# Patient Record
Sex: Male | Born: 1945
Health system: Southern US, Community
[De-identification: ages and names within clinical notes are randomized; demographics above are authoritative.]

## PROBLEM LIST (undated history)

## (undated) DIAGNOSIS — Z950 Presence of cardiac pacemaker: Secondary | ICD-10-CM

## (undated) DIAGNOSIS — M1990 Unspecified osteoarthritis, unspecified site: Secondary | ICD-10-CM

## (undated) DIAGNOSIS — Z8669 Personal history of other diseases of the nervous system and sense organs: Secondary | ICD-10-CM

## (undated) DIAGNOSIS — T4145XA Adverse effect of unspecified anesthetic, initial encounter: Secondary | ICD-10-CM

## (undated) DIAGNOSIS — C801 Malignant (primary) neoplasm, unspecified: Secondary | ICD-10-CM

## (undated) DIAGNOSIS — T8859XA Other complications of anesthesia, initial encounter: Secondary | ICD-10-CM

## (undated) DIAGNOSIS — I459 Conduction disorder, unspecified: Secondary | ICD-10-CM

## (undated) DIAGNOSIS — G7000 Myasthenia gravis without (acute) exacerbation: Secondary | ICD-10-CM

## (undated) DIAGNOSIS — Z96619 Presence of unspecified artificial shoulder joint: Secondary | ICD-10-CM

## (undated) DIAGNOSIS — Z8546 Personal history of malignant neoplasm of prostate: Secondary | ICD-10-CM

## (undated) DIAGNOSIS — K219 Gastro-esophageal reflux disease without esophagitis: Secondary | ICD-10-CM

## (undated) DIAGNOSIS — I442 Atrioventricular block, complete: Secondary | ICD-10-CM

## (undated) HISTORY — DX: Presence of unspecified artificial shoulder joint: Z96.619

## (undated) HISTORY — PX: CATARACT EXTRACTION: SUR2

## (undated) HISTORY — DX: Personal history of other diseases of the nervous system and sense organs: Z86.69

## (undated) HISTORY — DX: Personal history of malignant neoplasm of prostate: Z85.46

## (undated) HISTORY — PX: TONSILLECTOMY: SUR1361

## (undated) HISTORY — DX: Atrioventricular block, complete: I44.2

## (undated) HISTORY — DX: Myasthenia gravis without (acute) exacerbation: G70.00

---

## 1989-12-02 DIAGNOSIS — C801 Malignant (primary) neoplasm, unspecified: Secondary | ICD-10-CM

## 1989-12-02 HISTORY — PX: PROSTATE SURGERY: SHX751

## 1989-12-02 HISTORY — DX: Malignant (primary) neoplasm, unspecified: C80.1

## 1991-12-03 HISTORY — PX: PENILE PROSTHESIS IMPLANT: SHX240

## 2003-12-03 HISTORY — PX: HERNIA REPAIR: SHX51

## 2014-03-31 DIAGNOSIS — M25569 Pain in unspecified knee: Secondary | ICD-10-CM | POA: Diagnosis not present

## 2014-03-31 DIAGNOSIS — M1710 Unilateral primary osteoarthritis, unspecified knee: Secondary | ICD-10-CM | POA: Diagnosis not present

## 2014-03-31 DIAGNOSIS — M25519 Pain in unspecified shoulder: Secondary | ICD-10-CM | POA: Diagnosis not present

## 2014-03-31 DIAGNOSIS — M19019 Primary osteoarthritis, unspecified shoulder: Secondary | ICD-10-CM | POA: Diagnosis not present

## 2014-04-13 DIAGNOSIS — N10 Acute tubulo-interstitial nephritis: Secondary | ICD-10-CM | POA: Diagnosis not present

## 2014-04-27 DIAGNOSIS — N10 Acute tubulo-interstitial nephritis: Secondary | ICD-10-CM | POA: Diagnosis not present

## 2014-04-28 DIAGNOSIS — M25819 Other specified joint disorders, unspecified shoulder: Secondary | ICD-10-CM | POA: Diagnosis not present

## 2014-04-28 DIAGNOSIS — M1710 Unilateral primary osteoarthritis, unspecified knee: Secondary | ICD-10-CM | POA: Diagnosis not present

## 2014-04-28 DIAGNOSIS — IMO0002 Reserved for concepts with insufficient information to code with codable children: Secondary | ICD-10-CM | POA: Diagnosis not present

## 2014-05-16 DIAGNOSIS — J111 Influenza due to unidentified influenza virus with other respiratory manifestations: Secondary | ICD-10-CM | POA: Diagnosis not present

## 2014-05-24 DIAGNOSIS — N318 Other neuromuscular dysfunction of bladder: Secondary | ICD-10-CM | POA: Diagnosis not present

## 2014-05-24 DIAGNOSIS — N390 Urinary tract infection, site not specified: Secondary | ICD-10-CM | POA: Diagnosis not present

## 2014-05-24 DIAGNOSIS — R5381 Other malaise: Secondary | ICD-10-CM | POA: Diagnosis not present

## 2014-05-24 DIAGNOSIS — R5383 Other fatigue: Secondary | ICD-10-CM | POA: Diagnosis not present

## 2014-05-24 DIAGNOSIS — C61 Malignant neoplasm of prostate: Secondary | ICD-10-CM | POA: Diagnosis not present

## 2014-05-25 DIAGNOSIS — S8010XA Contusion of unspecified lower leg, initial encounter: Secondary | ICD-10-CM | POA: Diagnosis not present

## 2014-07-26 DIAGNOSIS — N3000 Acute cystitis without hematuria: Secondary | ICD-10-CM | POA: Diagnosis not present

## 2014-08-11 DIAGNOSIS — M1710 Unilateral primary osteoarthritis, unspecified knee: Secondary | ICD-10-CM | POA: Diagnosis not present

## 2014-08-15 DIAGNOSIS — M25519 Pain in unspecified shoulder: Secondary | ICD-10-CM | POA: Diagnosis not present

## 2014-08-31 DIAGNOSIS — M19019 Primary osteoarthritis, unspecified shoulder: Secondary | ICD-10-CM | POA: Diagnosis not present

## 2014-09-06 DIAGNOSIS — Z23 Encounter for immunization: Secondary | ICD-10-CM | POA: Diagnosis not present

## 2014-09-07 DIAGNOSIS — E780 Pure hypercholesterolemia: Secondary | ICD-10-CM | POA: Diagnosis not present

## 2014-09-07 DIAGNOSIS — M19211 Secondary osteoarthritis, right shoulder: Secondary | ICD-10-CM | POA: Diagnosis not present

## 2014-09-07 DIAGNOSIS — M859 Disorder of bone density and structure, unspecified: Secondary | ICD-10-CM | POA: Diagnosis not present

## 2014-09-07 DIAGNOSIS — M898X9 Other specified disorders of bone, unspecified site: Secondary | ICD-10-CM | POA: Diagnosis not present

## 2014-09-07 DIAGNOSIS — Z13820 Encounter for screening for osteoporosis: Secondary | ICD-10-CM | POA: Diagnosis not present

## 2014-09-07 DIAGNOSIS — Z23 Encounter for immunization: Secondary | ICD-10-CM | POA: Diagnosis not present

## 2014-09-07 DIAGNOSIS — Z0000 Encounter for general adult medical examination without abnormal findings: Secondary | ICD-10-CM | POA: Diagnosis not present

## 2014-09-22 DIAGNOSIS — M25511 Pain in right shoulder: Secondary | ICD-10-CM | POA: Diagnosis not present

## 2014-09-30 ENCOUNTER — Encounter (HOSPITAL_COMMUNITY): Payer: Self-pay | Admitting: Pharmacy Technician

## 2014-10-04 ENCOUNTER — Encounter (HOSPITAL_COMMUNITY): Payer: Self-pay

## 2014-10-04 ENCOUNTER — Encounter (HOSPITAL_COMMUNITY)
Admission: RE | Admit: 2014-10-04 | Discharge: 2014-10-04 | Disposition: A | Discharge status: Home / Self Care | Payer: Medicare Other | Source: Ambulatory Visit | Attending: Orthopedic Surgery | Admitting: Orthopedic Surgery

## 2014-10-04 DIAGNOSIS — Z01812 Encounter for preprocedural laboratory examination: Secondary | ICD-10-CM | POA: Insufficient documentation

## 2014-10-04 HISTORY — DX: Adverse effect of unspecified anesthetic, initial encounter: T41.45XA

## 2014-10-04 HISTORY — DX: Malignant (primary) neoplasm, unspecified: C80.1

## 2014-10-04 HISTORY — DX: Other complications of anesthesia, initial encounter: T88.59XA

## 2014-10-04 HISTORY — DX: Gastro-esophageal reflux disease without esophagitis: K21.9

## 2014-10-04 HISTORY — DX: Unspecified osteoarthritis, unspecified site: M19.90

## 2014-10-04 LAB — BASIC METABOLIC PANEL
Anion gap: 12 (ref 5–15)
BUN: 18 mg/dL (ref 6–23)
CHLORIDE: 100 meq/L (ref 96–112)
CO2: 26 meq/L (ref 19–32)
CREATININE: 0.96 mg/dL (ref 0.50–1.35)
Calcium: 9.9 mg/dL (ref 8.4–10.5)
GFR calc Af Amer: >90 mL/min (ref >90)
GFR calc non Af Amer: 83 mL/min — ABNORMAL LOW (ref >90)
GLUCOSE: 92 mg/dL (ref 70–99)
POTASSIUM: 4.6 meq/L (ref 3.7–5.3)
Sodium: 138 mEq/L (ref 137–147)

## 2014-10-04 LAB — CBC
HEMATOCRIT: 43.6 % (ref 39.0–52.0)
HEMOGLOBIN: 15.3 g/dL (ref 13.0–17.0)
MCH: 31.9 pg (ref 26.0–34.0)
MCHC: 35.1 g/dL (ref 30.0–36.0)
MCV: 90.8 fL (ref 78.0–100.0)
Platelets: 177 10*3/uL (ref 150–400)
RBC: 4.8 MIL/uL (ref 4.22–5.81)
RDW: 12.5 % (ref 11.5–15.5)
WBC: 8.5 10*3/uL (ref 4.0–10.5)

## 2014-10-04 NOTE — H&P (Signed)
  Kyle Bowen is an 68 y.o. male.    Chief Complaint: right shoulder pain  HPI: Pt is a 68 y.o. male complaining of right shoulder pain for multiple years. Pain had continually increased since the beginning. X-rays in the clinic show end-stage arthritic changes of the right shoulder. Pt has tried various conservative treatments which have failed to alleviate their symptoms, including injections and therapy. Various options are discussed with the patient. Risks, benefits and expectations were discussed with the patient. Patient understand the risks, benefits and expectations and wishes to proceed with surgery.   PCP:  Cyndy Freeze, MD  D/C Plans:  Home with HHPT  PMH: No past medical history on file.  PSH: No past surgical history on file.  Social History:  has no tobacco, alcohol, and drug history on file.  Allergies:  No Known Allergies  Medications: No current facility-administered medications for this encounter.   Current Outpatient Prescriptions  Medication Sig Dispense Refill  . aspirin 81 MG tablet Take 81 mg by mouth daily.    . calcium carbonate (OS-CAL) 600 MG TABS tablet Take 600 mg by mouth 2 (two) times daily with a meal.    . meloxicam (MOBIC) 15 MG tablet Take 15 mg by mouth daily.    . Multiple Vitamins-Minerals (MENS MULTI VITAMIN & MINERAL) TABS Take 1 tablet by mouth daily.    . Omega-3 Fatty Acids (FISH OIL) 300 MG CAPS Take 1 capsule by mouth daily.    . simvastatin (ZOCOR) 40 MG tablet Take 40 mg by mouth daily.      No results found for this or any previous visit (from the past 48 hour(s)). No results found.  ROS: Pain with rom of the right upper extremity  Physical Exam:  Alert and oriented 68 y.o. male in no acute distress Cranial nerves 2-12 intact Cervical spine: full rom with no tenderness, nv intact distally Chest: active breath sounds bilaterally, no wheeze rhonchi or rales Heart: regular rate and rhythm, no murmur Abd: non tender non  distended with active bowel sounds Hip is stable with rom  Right shoulder with mild to moderate crepitus with rom nv intact distally Strength of ER and IR 4.5/5  No rashes or edema  Assessment/Plan Assessment: right shoulder end stage osteoarthritis  Plan: Patient will undergo a right total shoulder arthroplasty by Dr. Veverly Fells at [facility]. Risks benefits and expectations were discussed with the patient. Patient understand risks, benefits and expectations and wishes to proceed.

## 2014-10-04 NOTE — Pre-Procedure Instructions (Addendum)
Kyle Bowen  10/04/2014   Your procedure is scheduled on:  Friday, November 13.             Report to [facility] Admitting at 11:40 AM.  Call this number if you have problems the morning of surgery: [phone]   Remember:   Do not eat food or drink liquids after midnight Thursday, November 10.   Take these medicines the morning of surgery with A SIP OF WATER: None.               Stop taking Aspirin, Meloxicam, Vitamins and Fish Oil on Friday, November 6.   Do not wear jewelry, make-up or nail polish.  Do not wear lotions, powders, or perfumes.    Men may shave face and neck.  Do not bring valuables to the hospital.               [facility] is not responsible for any belongings or valuables.               Contacts, dentures or bridgework may not be worn into surgery.  Leave suitcase in the car. After surgery it may be brought to your room.  For patients admitted to the hospital, discharge time is determined by your treatment team.               Patients discharged the day of surgery will not be allowed to drive home.  Name and phone number of your driver: -   Special Instructions: - Special Instructions: [place] - Preparing for Surgery  Before surgery, you can play an important role.  Because skin is not sterile, your skin needs to be as free of germs as possible.  You can reduce the number of germs on you skin by washing with CHG (chlorahexidine gluconate) soap before surgery.  CHG is an antiseptic cleaner which kills germs and bonds with the skin to continue killing germs even after washing.  Please DO NOT use if you have an allergy to CHG or antibacterial soaps.  If your skin becomes reddened/irritated stop using the CHG and inform your nurse when you arrive at Short Stay.  Do not shave (including legs and underarms) for at least 48 hours prior to the first CHG shower.  You may shave your face.  Please follow these instructions carefully:   1.  Shower  with CHG Soap the night before surgery and the morning of Surgery.  2.  If you choose to wash your hair, wash your hair first as usual with your normal shampoo.  3.  After you shampoo, rinse your hair and body thoroughly to remove the Shampoo.  4.  Use CHG as you would any other liquid soap.  You can apply chg directly  to the skin and wash gently with scrungie or a clean washcloth.  5.  Apply the CHG Soap to your body ONLY FROM THE NECK DOWN.  Do not use on open wounds or open sores.  Avoid contact with your eyes ears, mouth and genitals (private parts).  Wash genitals (private parts)       with your normal soap.  6.  Wash thoroughly, paying special attention to the area where your surgery will be performed.  7.  Thoroughly rinse your body with warm water from the neck down.  8.  DO NOT shower/wash with your normal soap after using and rinsing off the CHG Soap.  9.  Pat yourself dry with a clean towel.  10.  Wear clean pajamas.            11.  Place clean sheets on your bed the night of your first shower and do not sleep with pets.  Day of Surgery  Do not apply any lotions/deodorants the morning of surgery.  Please wear clean clothes to the hospital/surgery center.   Please read over the following fact sheets that you were given: Pain Booklet, Coughing and Deep Breathing and Surgical Site Infection Prevention

## 2014-10-13 MED ORDER — CEFAZOLIN SODIUM-DEXTROSE 2-3 GM-% IV SOLR
2.0000 g | INTRAVENOUS | Status: AC
  Administered 2014-10-14: 2 g via INTRAVENOUS
  Filled 2014-10-13: qty 50

## 2014-10-14 ENCOUNTER — Encounter (HOSPITAL_COMMUNITY)
Admission: RE | Disposition: A | Discharge status: Home / Self Care | Payer: Self-pay | Source: Ambulatory Visit | Attending: Orthopedic Surgery

## 2014-10-14 ENCOUNTER — Inpatient Hospital Stay (HOSPITAL_COMMUNITY): Payer: Medicare Other

## 2014-10-14 ENCOUNTER — Inpatient Hospital Stay (HOSPITAL_COMMUNITY): Payer: Medicare Other | Admitting: Anesthesiology

## 2014-10-14 ENCOUNTER — Encounter (HOSPITAL_COMMUNITY): Payer: Self-pay | Admitting: *Deleted

## 2014-10-14 ENCOUNTER — Inpatient Hospital Stay (HOSPITAL_COMMUNITY)
Admission: RE | Admit: 2014-10-14 | Discharge: 2014-10-15 | DRG: 483 | Disposition: A | Discharge status: Home / Self Care | Payer: Medicare Other | Source: Ambulatory Visit | Attending: Orthopedic Surgery | Admitting: Orthopedic Surgery

## 2014-10-14 DIAGNOSIS — Z79899 Other long term (current) drug therapy: Secondary | ICD-10-CM

## 2014-10-14 DIAGNOSIS — M1990 Unspecified osteoarthritis, unspecified site: Secondary | ICD-10-CM | POA: Diagnosis not present

## 2014-10-14 DIAGNOSIS — Z96619 Presence of unspecified artificial shoulder joint: Secondary | ICD-10-CM

## 2014-10-14 DIAGNOSIS — Z96611 Presence of right artificial shoulder joint: Secondary | ICD-10-CM | POA: Diagnosis not present

## 2014-10-14 DIAGNOSIS — Z7982 Long term (current) use of aspirin: Secondary | ICD-10-CM | POA: Diagnosis not present

## 2014-10-14 DIAGNOSIS — M19011 Primary osteoarthritis, right shoulder: Secondary | ICD-10-CM | POA: Diagnosis present

## 2014-10-14 DIAGNOSIS — G8918 Other acute postprocedural pain: Secondary | ICD-10-CM | POA: Diagnosis not present

## 2014-10-14 DIAGNOSIS — M25511 Pain in right shoulder: Secondary | ICD-10-CM | POA: Diagnosis not present

## 2014-10-14 DIAGNOSIS — Z471 Aftercare following joint replacement surgery: Secondary | ICD-10-CM | POA: Diagnosis not present

## 2014-10-14 HISTORY — DX: Presence of unspecified artificial shoulder joint: Z96.619

## 2014-10-14 HISTORY — PX: TOTAL SHOULDER ARTHROPLASTY: SHX126

## 2014-10-14 SURGERY — ARTHROPLASTY, SHOULDER, TOTAL
Anesthesia: Regional | Site: Shoulder | Laterality: Right

## 2014-10-14 MED ORDER — ASPIRIN 81 MG PO TABS: 81.0000 mg | ORAL_TABLET | Freq: Every day | ORAL | Status: DC

## 2014-10-14 MED ORDER — THROMBIN 5000 UNITS EX SOLR
OROMUCOSAL | Status: DC | PRN
  Administered 2014-10-14: 5 mL via TOPICAL

## 2014-10-14 MED ORDER — THROMBIN 5000 UNITS EX SOLR: CUTANEOUS | Status: DC | PRN

## 2014-10-14 MED ORDER — NEOSTIGMINE METHYLSULFATE 10 MG/10ML IV SOLN
INTRAVENOUS | Status: DC | PRN
  Administered 2014-10-14: 3 mg via INTRAVENOUS

## 2014-10-14 MED ORDER — METOCLOPRAMIDE HCL 5 MG/ML IJ SOLN: 5.0000 mg | Freq: Three times a day (TID) | INTRAMUSCULAR | Status: DC | PRN

## 2014-10-14 MED ORDER — CEFAZOLIN SODIUM-DEXTROSE 2-3 GM-% IV SOLR
2.0000 g | Freq: Four times a day (QID) | INTRAVENOUS | Status: AC
  Administered 2014-10-14 – 2014-10-15 (×3): 2 g via INTRAVENOUS
  Filled 2014-10-14 (×3): qty 50

## 2014-10-14 MED ORDER — ONDANSETRON HCL 4 MG/2ML IJ SOLN: 4.0000 mg | Freq: Four times a day (QID) | INTRAMUSCULAR | Status: DC | PRN

## 2014-10-14 MED ORDER — PROPOFOL 10 MG/ML IV BOLUS
INTRAVENOUS | Status: AC
  Filled 2014-10-14: qty 20

## 2014-10-14 MED ORDER — FENTANYL CITRATE 0.05 MG/ML IJ SOLN
INTRAMUSCULAR | Status: AC
  Administered 2014-10-14: 100 ug
  Filled 2014-10-14: qty 2

## 2014-10-14 MED ORDER — BUPIVACAINE-EPINEPHRINE (PF) 0.25% -1:200000 IJ SOLN
INTRAMUSCULAR | Status: AC
  Filled 2014-10-14: qty 30

## 2014-10-14 MED ORDER — BUPIVACAINE-EPINEPHRINE (PF) 0.5% -1:200000 IJ SOLN
INTRAMUSCULAR | Status: DC | PRN
  Administered 2014-10-14: 30 mL via PERINEURAL

## 2014-10-14 MED ORDER — BISACODYL 10 MG RE SUPP: 10.0000 mg | Freq: Every day | RECTAL | Status: DC | PRN

## 2014-10-14 MED ORDER — OXYCODONE-ACETAMINOPHEN 5-325 MG PO TABS
1.0000 | ORAL_TABLET | ORAL | Status: DC | PRN
  Administered 2014-10-15 (×4): 1 via ORAL
  Filled 2014-10-14 (×4): qty 1

## 2014-10-14 MED ORDER — MENS MULTI VITAMIN & MINERAL PO TABS: 1.0000 | ORAL_TABLET | Freq: Every day | ORAL | Status: DC

## 2014-10-14 MED ORDER — PROPOFOL 10 MG/ML IV BOLUS
INTRAVENOUS | Status: DC | PRN
  Administered 2014-10-14: 180 mg via INTRAVENOUS

## 2014-10-14 MED ORDER — ONDANSETRON HCL 4 MG/2ML IJ SOLN
INTRAMUSCULAR | Status: DC | PRN
Start: 2014-10-14 — End: 2014-10-14
  Administered 2014-10-14: 4 mg via INTRAVENOUS

## 2014-10-14 MED ORDER — OMEGA-3-ACID ETHYL ESTERS 1 G PO CAPS
1.0000 g | ORAL_CAPSULE | Freq: Every day | ORAL | Status: DC
  Administered 2014-10-14 – 2014-10-15 (×2): 1 g via ORAL
  Filled 2014-10-14 (×2): qty 1

## 2014-10-14 MED ORDER — LACTATED RINGERS IV SOLN
INTRAVENOUS | Status: DC
  Administered 2014-10-14 (×2): via INTRAVENOUS

## 2014-10-14 MED ORDER — THROMBIN 5000 UNITS EX SOLR
CUTANEOUS | Status: AC
  Filled 2014-10-14: qty 5000

## 2014-10-14 MED ORDER — FENTANYL CITRATE 0.05 MG/ML IJ SOLN
INTRAMUSCULAR | Status: DC | PRN
  Administered 2014-10-14: 100 ug via INTRAVENOUS

## 2014-10-14 MED ORDER — METHOCARBAMOL 500 MG PO TABS
500.0000 mg | ORAL_TABLET | Freq: Four times a day (QID) | ORAL | Status: DC | PRN
  Administered 2014-10-15 (×2): 500 mg via ORAL
  Filled 2014-10-14 (×2): qty 1

## 2014-10-14 MED ORDER — OXYCODONE-ACETAMINOPHEN 5-325 MG PO TABS: 1.0000 | ORAL_TABLET | ORAL | Status: DC | PRN

## 2014-10-14 MED ORDER — CALCIUM CARBONATE 600 MG PO TABS: 600.0000 mg | ORAL_TABLET | Freq: Two times a day (BID) | ORAL | Status: DC

## 2014-10-14 MED ORDER — SODIUM CHLORIDE 0.9 % IV SOLN
10.0000 mg | INTRAVENOUS | Status: DC | PRN
  Administered 2014-10-14: 10 ug/min via INTRAVENOUS

## 2014-10-14 MED ORDER — BUPIVACAINE-EPINEPHRINE 0.25% -1:200000 IJ SOLN
INTRAMUSCULAR | Status: DC | PRN
  Administered 2014-10-14: 9 mL

## 2014-10-14 MED ORDER — ASPIRIN EC 81 MG PO TBEC
81.0000 mg | DELAYED_RELEASE_TABLET | Freq: Every day | ORAL | Status: DC
  Administered 2014-10-14 – 2014-10-15 (×2): 81 mg via ORAL
  Filled 2014-10-14 (×2): qty 1

## 2014-10-14 MED ORDER — METHOCARBAMOL 500 MG PO TABS: 500.0000 mg | ORAL_TABLET | Freq: Three times a day (TID) | ORAL | Status: DC | PRN

## 2014-10-14 MED ORDER — HYDROMORPHONE HCL 1 MG/ML IJ SOLN: 0.2500 mg | INTRAMUSCULAR | Status: DC | PRN

## 2014-10-14 MED ORDER — ALBUMIN HUMAN 5 % IV SOLN
INTRAVENOUS | Status: AC
  Filled 2014-10-14: qty 250

## 2014-10-14 MED ORDER — LIDOCAINE HCL (CARDIAC) 20 MG/ML IV SOLN
INTRAVENOUS | Status: DC | PRN
  Administered 2014-10-14: 60 mg via INTRAVENOUS

## 2014-10-14 MED ORDER — OXYCODONE HCL 5 MG/5ML PO SOLN: 5.0000 mg | Freq: Once | ORAL | Status: DC | PRN

## 2014-10-14 MED ORDER — SODIUM CHLORIDE 0.9 % IR SOLN
Status: DC | PRN
  Administered 2014-10-14: 1000 mL

## 2014-10-14 MED ORDER — PHENOL 1.4 % MT LIQD: 1.0000 | OROMUCOSAL | Status: DC | PRN

## 2014-10-14 MED ORDER — ONDANSETRON HCL 4 MG/2ML IJ SOLN
INTRAMUSCULAR | Status: AC
  Filled 2014-10-14: qty 2

## 2014-10-14 MED ORDER — CHLORHEXIDINE GLUCONATE 4 % EX LIQD
60.0000 mL | Freq: Once | CUTANEOUS | Status: DC
  Filled 2014-10-14: qty 60

## 2014-10-14 MED ORDER — METHOCARBAMOL 1000 MG/10ML IJ SOLN
500.0000 mg | Freq: Four times a day (QID) | INTRAVENOUS | Status: DC | PRN
  Filled 2014-10-14: qty 5

## 2014-10-14 MED ORDER — GLYCOPYRROLATE 0.2 MG/ML IJ SOLN
INTRAMUSCULAR | Status: AC
  Filled 2014-10-14: qty 2

## 2014-10-14 MED ORDER — POLYETHYLENE GLYCOL 3350 17 G PO PACK
17.0000 g | PACK | Freq: Every day | ORAL | Status: DC | PRN
Start: 2014-10-14 — End: 2014-10-15

## 2014-10-14 MED ORDER — LIDOCAINE HCL (CARDIAC) 20 MG/ML IV SOLN
INTRAVENOUS | Status: AC
  Filled 2014-10-14: qty 5

## 2014-10-14 MED ORDER — SODIUM CHLORIDE 0.9 % IV SOLN: INTRAVENOUS | Status: DC

## 2014-10-14 MED ORDER — ACETAMINOPHEN 325 MG PO TABS: 650.0000 mg | ORAL_TABLET | Freq: Four times a day (QID) | ORAL | Status: DC | PRN

## 2014-10-14 MED ORDER — ONDANSETRON HCL 4 MG PO TABS: 4.0000 mg | ORAL_TABLET | Freq: Four times a day (QID) | ORAL | Status: DC | PRN

## 2014-10-14 MED ORDER — SIMVASTATIN 40 MG PO TABS
40.0000 mg | ORAL_TABLET | Freq: Every day | ORAL | Status: DC
  Administered 2014-10-14 – 2014-10-15 (×2): 40 mg via ORAL
  Filled 2014-10-14 (×2): qty 1

## 2014-10-14 MED ORDER — HYDROMORPHONE HCL 1 MG/ML IJ SOLN: 1.0000 mg | INTRAMUSCULAR | Status: DC | PRN

## 2014-10-14 MED ORDER — ALBUMIN HUMAN 5 % IV SOLN
12.5000 g | Freq: Once | INTRAVENOUS | Status: AC
  Administered 2014-10-14: 12.5 g via INTRAVENOUS

## 2014-10-14 MED ORDER — PHENYLEPHRINE HCL 10 MG/ML IJ SOLN
INTRAMUSCULAR | Status: AC
  Filled 2014-10-14: qty 2

## 2014-10-14 MED ORDER — ROCURONIUM BROMIDE 50 MG/5ML IV SOLN
INTRAVENOUS | Status: AC
  Filled 2014-10-14: qty 1

## 2014-10-14 MED ORDER — ACETAMINOPHEN 650 MG RE SUPP: 650.0000 mg | Freq: Four times a day (QID) | RECTAL | Status: DC | PRN

## 2014-10-14 MED ORDER — MELOXICAM 15 MG PO TABS
15.0000 mg | ORAL_TABLET | Freq: Every day | ORAL | Status: DC
  Administered 2014-10-14 – 2014-10-15 (×2): 15 mg via ORAL
  Filled 2014-10-14 (×2): qty 1

## 2014-10-14 MED ORDER — CEFAZOLIN SODIUM-DEXTROSE 2-3 GM-% IV SOLR: INTRAVENOUS | Status: DC | PRN

## 2014-10-14 MED ORDER — MIDAZOLAM HCL 2 MG/2ML IJ SOLN
INTRAMUSCULAR | Status: AC
  Administered 2014-10-14: 2 mg
  Filled 2014-10-14: qty 2

## 2014-10-14 MED ORDER — FENTANYL CITRATE 0.05 MG/ML IJ SOLN
INTRAMUSCULAR | Status: AC
  Filled 2014-10-14: qty 5

## 2014-10-14 MED ORDER — ADULT MULTIVITAMIN W/MINERALS CH
1.0000 | ORAL_TABLET | Freq: Every day | ORAL | Status: DC
  Administered 2014-10-15: 1 via ORAL
  Filled 2014-10-14: qty 1

## 2014-10-14 MED ORDER — GLYCOPYRROLATE 0.2 MG/ML IJ SOLN
INTRAMUSCULAR | Status: DC | PRN
  Administered 2014-10-14: .4 mg via INTRAVENOUS

## 2014-10-14 MED ORDER — MENTHOL 3 MG MT LOZG: 1.0000 | LOZENGE | OROMUCOSAL | Status: DC | PRN

## 2014-10-14 MED ORDER — OXYCODONE HCL 5 MG PO TABS: 5.0000 mg | ORAL_TABLET | Freq: Once | ORAL | Status: DC | PRN

## 2014-10-14 MED ORDER — CALCIUM CARBONATE 1250 (500 CA) MG PO TABS
1.0000 | ORAL_TABLET | Freq: Two times a day (BID) | ORAL | Status: DC
  Administered 2014-10-15: 500 mg via ORAL
  Filled 2014-10-14 (×3): qty 1

## 2014-10-14 MED ORDER — ROCURONIUM BROMIDE 100 MG/10ML IV SOLN
INTRAVENOUS | Status: DC | PRN
  Administered 2014-10-14: 30 mg via INTRAVENOUS

## 2014-10-14 MED ORDER — METOCLOPRAMIDE HCL 10 MG PO TABS: 5.0000 mg | ORAL_TABLET | Freq: Three times a day (TID) | ORAL | Status: DC | PRN

## 2014-10-14 SURGICAL SUPPLY — 77 items
BLADE SAW SAG 73X25 THK (BLADE) ×2
BLADE SAW SGTL 73X25 THK (BLADE) ×1 IMPLANT
BODY PROX SHOULDER SZ 14-135 (Shoulder) ×3 IMPLANT
BUR SURG 4X8 MED (BURR) IMPLANT
BURR SURG 4MMX8MM MEDIUM (BURR)
BURR SURG 4X8 MED (BURR)
CEMENT BONE DEPUY (Cement) ×3 IMPLANT
CLOSURE WOUND 1/2 X4 (GAUZE/BANDAGES/DRESSINGS) ×1
COVER SURGICAL LIGHT HANDLE (MISCELLANEOUS) ×3 IMPLANT
DRAPE IMP U-DRAPE 54X76 (DRAPES) ×3 IMPLANT
DRAPE INCISE IOBAN 66X45 STRL (DRAPES) ×9 IMPLANT
DRAPE U-SHAPE 47X51 STRL (DRAPES) ×3 IMPLANT
DRAPE X-RAY CASS 24X20 (DRAPES) IMPLANT
DRILL BIT 5/64 (BIT) ×3 IMPLANT
DRSG ADAPTIC 3X8 NADH LF (GAUZE/BANDAGES/DRESSINGS) ×3 IMPLANT
DRSG PAD ABDOMINAL 8X10 ST (GAUZE/BANDAGES/DRESSINGS) ×6 IMPLANT
DURAPREP 26ML APPLICATOR (WOUND CARE) ×3 IMPLANT
ELECT BLADE 4.0 EZ CLEAN MEGAD (MISCELLANEOUS) ×3
ELECT NEEDLE TIP 2.8 STRL (NEEDLE) ×3 IMPLANT
ELECT REM PT RETURN 9FT ADLT (ELECTROSURGICAL) ×3
ELECTRODE BLDE 4.0 EZ CLN MEGD (MISCELLANEOUS) ×1 IMPLANT
ELECTRODE REM PT RTRN 9FT ADLT (ELECTROSURGICAL) ×1 IMPLANT
GAUZE SPONGE 4X4 12PLY STRL (GAUZE/BANDAGES/DRESSINGS) ×3 IMPLANT
GLENOID ANCHOR PEG CROSSLK 48 (Orthopedic Implant) ×3 IMPLANT
GLOVE BIOGEL PI ORTHO PRO 7.5 (GLOVE) ×2
GLOVE BIOGEL PI ORTHO PRO SZ8 (GLOVE) ×2
GLOVE ORTHO TXT STRL SZ7.5 (GLOVE) ×3 IMPLANT
GLOVE PI ORTHO PRO STRL 7.5 (GLOVE) ×1 IMPLANT
GLOVE PI ORTHO PRO STRL SZ8 (GLOVE) ×1 IMPLANT
GLOVE SURG ORTHO 8.5 STRL (GLOVE) ×6 IMPLANT
GOWN STRL REUS W/ TWL XL LVL3 (GOWN DISPOSABLE) ×3 IMPLANT
GOWN STRL REUS W/TWL XL LVL3 (GOWN DISPOSABLE) ×6
HANDPIECE INTERPULSE COAX TIP (DISPOSABLE)
HEAD ECC HUMERAL SZ 48MMX18MM (Head) ×3 IMPLANT
KIT BASIN OR (CUSTOM PROCEDURE TRAY) ×3 IMPLANT
KIT ROOM TURNOVER OR (KITS) ×3 IMPLANT
MANIFOLD NEPTUNE II (INSTRUMENTS) ×3 IMPLANT
NDL SUT 2 .5 CRC MAYO 1.732X (NEEDLE) ×1 IMPLANT
NDL SUT 6 .5 CRC .975X.05 MAYO (NEEDLE) ×1 IMPLANT
NEEDLE 1/2 CIR MAYO (NEEDLE) ×3 IMPLANT
NEEDLE HYPO 25GX1X1/2 BEV (NEEDLE) ×3 IMPLANT
NEEDLE MAYO TAPER (NEEDLE) ×4
NS IRRIG 1000ML POUR BTL (IV SOLUTION) ×3 IMPLANT
PACK SHOULDER (CUSTOM PROCEDURE TRAY) ×3 IMPLANT
PACK UNIVERSAL I (CUSTOM PROCEDURE TRAY) ×3 IMPLANT
PAD ARMBOARD 7.5X6 YLW CONV (MISCELLANEOUS) ×3 IMPLANT
PIN METAGLENE 2.5 (PIN) ×3 IMPLANT
SET HNDPC FAN SPRY TIP SCT (DISPOSABLE) IMPLANT
SLING ARM IMMOBILIZER LRG (SOFTGOODS) ×3 IMPLANT
SLING ARM IMMOBILIZER MED (SOFTGOODS) IMPLANT
SMARTMIX MINI TOWER (MISCELLANEOUS) ×3
SPONGE LAP 18X18 X RAY DECT (DISPOSABLE) ×3 IMPLANT
SPONGE LAP 4X18 X RAY DECT (DISPOSABLE) ×3 IMPLANT
SPONGE SURGIFOAM ABS GEL SZ50 (HEMOSTASIS) IMPLANT
STEM GLOBAL UNITE 14MM PORO 11 (Joint) ×3 IMPLANT
STRIP CLOSURE SKIN 1/2X4 (GAUZE/BANDAGES/DRESSINGS) ×2 IMPLANT
SUCTION FRAZIER TIP 10 FR DISP (SUCTIONS) ×3 IMPLANT
SUT BONE WAX W31G (SUTURE) ×3 IMPLANT
SUT FIBERWIRE #2 38 REV NDL BL (SUTURE) ×6
SUT FIBERWIRE #2 38 T-5 BLUE (SUTURE) ×15
SUT MNCRL AB 3-0 PS2 18 (SUTURE) ×3 IMPLANT
SUT MNCRL AB 4-0 PS2 18 (SUTURE) ×3 IMPLANT
SUT VIC AB 0 CT1 27 (SUTURE) ×2
SUT VIC AB 0 CT1 27XBRD ANBCTR (SUTURE) ×1 IMPLANT
SUT VIC AB 2-0 CT1 27 (SUTURE) ×2
SUT VIC AB 2-0 CT1 TAPERPNT 27 (SUTURE) ×1 IMPLANT
SUT VICRYL AB 2 0 TIES (SUTURE) ×3 IMPLANT
SUTURE FIBERWR #2 38 T-5 BLUE (SUTURE) ×5 IMPLANT
SUTURE FIBERWR#2 38 REV NDL BL (SUTURE) ×2 IMPLANT
SYR CONTROL 10ML LL (SYRINGE) ×3 IMPLANT
TAPE CLOTH SURG 6X10 WHT LF (GAUZE/BANDAGES/DRESSINGS) ×3 IMPLANT
TOWEL OR 17X24 6PK STRL BLUE (TOWEL DISPOSABLE) ×3 IMPLANT
TOWEL OR 17X26 10 PK STRL BLUE (TOWEL DISPOSABLE) ×3 IMPLANT
TOWER SMARTMIX MINI (MISCELLANEOUS) ×1 IMPLANT
TRAY FOLEY CATH 16FRSI W/METER (SET/KITS/TRAYS/PACK) IMPLANT
WATER STERILE IRR 1000ML POUR (IV SOLUTION) ×3 IMPLANT
YANKAUER SUCT BULB TIP NO VENT (SUCTIONS) IMPLANT

## 2014-10-14 NOTE — Brief Op Note (Signed)
10/14/2014  4:24 PM  PATIENT:  Kyle Bowen  68 y.o. male  PRE-OPERATIVE DIAGNOSIS:  [place] OA, END STAGE  POST-OPERATIVE DIAGNOSIS:  RIGH SHOULDER OA, END STAGE  PROCEDURE:  Procedure(s): RIGHT TOTAL SHOULDER ARTHROPLASTY (Right) DePuy Global AP Stem with APG glenoid  SURGEON:  Surgeon(s) and Role:    * Augustin Schooling, MD - Primary  PHYSICIAN ASSISTANT:   ASSISTANTS: Ventura Bruns, PA-C   ANESTHESIA:   regional and general  EBL:  Total I/O In: 1000 [I.V.:1000] Out: -   BLOOD ADMINISTERED:none  DRAINS: none   LOCAL MEDICATIONS USED:  MARCAINE     SPECIMEN:  No Specimen  DISPOSITION OF SPECIMEN:  N/A  COUNTS:  YES  TOURNIQUET:  * No tourniquets in log *  DICTATION: .Other Dictation: Dictation Number [id number]  PLAN OF CARE: Admit to inpatient   PATIENT DISPOSITION:  PACU - hemodynamically stable.   Delay start of Pharmacological VTE agent (>24hrs) due to surgical blood loss or risk of bleeding: no

## 2014-10-14 NOTE — Interval H&P Note (Signed)
History and Physical Interval Note:  10/14/2014 1:00 PM  Kyle Bowen  has presented today for surgery, with the diagnosis of RIGH SHOULDER OA   The various methods of treatment have been discussed with the patient and family. After consideration of risks, benefits and other options for treatment, the patient has consented to  Procedure(s): RIGHT TOTAL SHOULDER ARTHROPLASTY (Right) as a surgical intervention .  The patient's history has been reviewed, patient examined, no change in status, stable for surgery.  I have reviewed the patient's chart and labs.  Questions were answered to the patient's satisfaction.     Oliviah Agostini,STEVEN R

## 2014-10-14 NOTE — Discharge Instructions (Signed)
Ice to the shoulder as much as possible.  Keep the incision clean and dry and intact, ok to shower and get the wound wet in one week.  Follow up with Dr Veverly Fells in 2 weeks  [phone]  Do the following exercises every hour,  Lap slides,  Door Hinge (rotation) exercise with the elbow near the side, assisted lifts.  No weight bearing with the right arm.  Use the sling out of the house, but ok to remove in the home, just hug a pillow

## 2014-10-14 NOTE — Transfer of Care (Signed)
Immediate Anesthesia Transfer of Care Note  Patient: Kyle Bowen  Procedure(s) Performed: Procedure(s): RIGHT TOTAL SHOULDER ARTHROPLASTY (Right)  Patient Location: PACU  Anesthesia Type:General  Level of Consciousness: awake, alert  and oriented  Airway & Oxygen Therapy: Patient Spontanous Breathing and Patient connected to nasal cannula oxygen  Post-op Assessment: Report given to PACU RN and Post -op Vital signs reviewed and stable  Post vital signs: Reviewed and stable  Complications: No apparent anesthesia complications

## 2014-10-14 NOTE — Anesthesia Procedure Notes (Addendum)
Anesthesia Regional Block:  Interscalene brachial plexus block  Pre-Anesthetic Checklist: ,, timeout performed, Correct Patient, Correct Site, Correct Laterality, Correct Procedure, Correct Position, site marked, Risks and benefits discussed,  Surgical consent,  Pre-op evaluation,  At surgeon's request and post-op pain management  Laterality: Right  Prep: chloraprep       Needles:  Injection technique: Single-shot  Needle Type: Echogenic Stimulator Needle     Needle Length: 5cm 5 cm Needle Gauge: 22 and 22 G    Additional Needles:  Procedures: ultrasound guided (picture in chart) and nerve stimulator Interscalene brachial plexus block  Nerve Stimulator or Paresthesia:  Response: biceps flexion, 0.45 mA,   Additional Responses:   Narrative:  Start time: 10/14/2014 12:25 PM End time: 10/14/2014 12:36 PM Injection made incrementally with aspirations every 5 mL.  Performed by: Personally   Additional Notes: Functioning IV was confirmed and monitors were applied.  A 58mm 22ga Arrow echogenic stimulator needle was used. Sterile prep and drape,hand hygiene and sterile gloves were used.  Negative aspiration and negative test dose prior to incremental administration of local anesthetic. The patient tolerated the procedure well.  Ultrasound guidance: relevent anatomy identified, needle position confirmed, local anesthetic spread visualized around nerve(s), vascular puncture avoided.  Image printed for medical record.    Procedure Name: Intubation Date/Time: 10/14/2014 1:25 PM Performed by: Rush Farmer E Pre-anesthesia Checklist: Patient identified, Emergency Drugs available, Suction available, Patient being monitored and Timeout performed Patient Re-evaluated:Patient Re-evaluated prior to inductionOxygen Delivery Method: Circle system utilized Preoxygenation: Pre-oxygenation with 100% oxygen Intubation Type: IV induction Ventilation: Mask ventilation without  difficulty Laryngoscope Size: Mac and 3 Grade View: Grade III Tube type: Oral Tube size: 7.5 mm Number of attempts: 1 Airway Equipment and Method: Stylet Placement Confirmation: positive ETCO2 and breath sounds checked- equal and bilateral Secured at: 22 cm Tube secured with: Tape Dental Injury: Teeth and Oropharynx as per pre-operative assessment

## 2014-10-14 NOTE — Anesthesia Preprocedure Evaluation (Addendum)
Anesthesia Evaluation  Patient identified by MRN, date of birth, ID band Patient awake    Reviewed: Allergy & Precautions, H&P , NPO status , Patient's Chart, lab work & pertinent test results  Airway Mallampati: II   Neck ROM: full    Dental  (+) Teeth Intact   Pulmonary neg pulmonary ROS,          Cardiovascular negative cardio ROS      Neuro/Psych    GI/Hepatic GERD-  ,  Endo/Other    Renal/GU      Musculoskeletal  (+) Arthritis -,   Abdominal   Peds  Hematology   Anesthesia Other Findings   Reproductive/Obstetrics                            Anesthesia Physical Anesthesia Plan  ASA: II  Anesthesia Plan: General and Regional   Post-op Pain Management: MAC Combined w/ Regional for Post-op pain   Induction: Intravenous  Airway Management Planned: Oral ETT  Additional Equipment:   Intra-op Plan:   Post-operative Plan: Extubation in OR  Informed Consent: I have reviewed the patients History and Physical, chart, labs and discussed the procedure including the risks, benefits and alternatives for the proposed anesthesia with the patient or authorized representative who has indicated his/her understanding and acceptance.     Plan Discussed with: CRNA, Anesthesiologist and Surgeon  Anesthesia Plan Comments:         Anesthesia Quick Evaluation

## 2014-10-15 LAB — BASIC METABOLIC PANEL
Anion gap: 10 (ref 5–15)
BUN: 11 mg/dL (ref 6–23)
CHLORIDE: 101 meq/L (ref 96–112)
CO2: 24 mEq/L (ref 19–32)
Calcium: 8.3 mg/dL — ABNORMAL LOW (ref 8.4–10.5)
Creatinine, Ser: 0.84 mg/dL (ref 0.50–1.35)
GFR, EST NON AFRICAN AMERICAN: 87 mL/min — AB (ref >90)
Glucose, Bld: 127 mg/dL — ABNORMAL HIGH (ref 70–99)
POTASSIUM: 4.1 meq/L (ref 3.7–5.3)
SODIUM: 135 meq/L — AB (ref 137–147)

## 2014-10-15 LAB — HEMOGLOBIN AND HEMATOCRIT, BLOOD
HCT: 33.9 % — ABNORMAL LOW (ref 39.0–52.0)
HEMOGLOBIN: 11.6 g/dL — AB (ref 13.0–17.0)

## 2014-10-15 NOTE — Op Note (Signed)
NAME:  Kyle Bowen, Kyle Bowen NO.:  [id number]  MEDICAL RECORD NO.:  [id number]  LOCATION:  [id number]                        FACILITY:  [facility]  PHYSICIAN:  Doran Heater. Veverly Fells, M.D. DATE OF BIRTH:  1946-07-16  DATE OF PROCEDURE:  10/14/2014 DATE OF DISCHARGE:                              OPERATIVE REPORT   PREOPERATIVE DIAGNOSIS:  Right shoulder end-stage osteoarthritis.  POSTOPERATIVE DIAGNOSIS:  Right shoulder end-stage osteoarthritis.  PROCEDURE PERFORMED:  Right shoulder replacement using DePuy global AP prosthesis with APG glenoid.  ATTENDING SURGEON:  Doran Heater. Veverly Fells, M.D.  ASSISTANT:  Abbott Pao. Dixon, PA-C who was scrubbed the entire procedure and necessary for satisfactory completion of surgery.  ANESTHESIA:  General anesthesia was used plus interscalene block.  ESTIMATED BLOOD LOSS:  100 mL.  FLUID REPLACEMENT:  1500 mL crystalloid.  INSTRUMENT COUNT:  Correct.  COMPLICATIONS:  There were no complications.  ANTIBIOTICS:  Perioperative antibiotics were given.  INDICATIONS:  The patient is a 68 year old male with worsening right shoulder arthritis and declining function secondary to end-stage arthritis.  The patient has bone-on-bone arthritis on x-ray and has competent rotator cuff on MRI scan.  I counseled the patient regarding options of management to include continued conservative management with injections, modification of activity and pain medications versus surgical treatment.  The patient elected to proceed with surgery. Informed consent was obtained.  DESCRIPTION OF PROCEDURE:  After an adequate level of anesthesia was achieved, the patient was placed in modified beach-chair position. Right shoulder was correctly identified and examined under anesthesia. The patient had about 30-40 degrees arc with internal and external rotation with crepitus, forward elevations approximately 90 degrees passively with a firm endpoint.  After a sterile prep and  drape of the shoulder and arm, we entered the shoulder using deltopectoral incision starting at the coracoid process extending down to the anterior humerus. Dissection was down through subcutaneous tissues using the needle-tip Bovie.  We identified the cephalic vein, took laterally the deltoid, pectoralis taken medially, the conjoined tendon identified and retracted medially.  The subscapularis taken off subperiosteally off the lesser tuberosity.  A #2 FiberWire was placed in a modified Mason-Allen suture technique in the free edge of the tendon for repair at the end of the case.  Progressive release of the capsule off the inferior neck of the humerus.  We externally rotated the humerus approximately 30 degrees and then used a neck resection guide to resect the humeral head.  We then noted there to be an undersurface rotator cuff attachment.  The lateral surface was intact, but the medial surface basically was inserting on the area of bone where we had removed the humeral head and so, we went ahead and took #2 FiberWire suture and reinforced.  We did Mason-Allen suture into the undersurface of the tendon and then repaired that back through the greater tuberosity out laterally and then at this point, went ahead and did our humeral preparation, removed excess osteophytes, reamed up to a size 14 and then broached with a 14 global AP stem. Next, we went ahead and retracted the humerus posteriorly.  We did a 360- degree capsulectomy protecting the axillary nerve and needs to free the subscapularis and  able to balance.  We then went ahead and removed posterior and inferior osteophytes off the glenoid face, identified the center point of the native glenoid, then drilled our central guidepin, reamed for the 48 glenoid.  This was a global APG glenoid.  We then did our peripheral hand reaming and used a rongeur to remove some additional peripheral bones.  We then went ahead and drilled our central  PEG hole and then our three peripheral PEG holes using the template referencing off the 6 o'clock position in the scapular neck.  We then dried the peripheral holes with Gelfoam soaked in thrombin.  We then went ahead and cemented the three peripheral holes using DePuy 1 cement and then impacted the real DePuy APG glenoid 48 into place.  We held that until the cement hardened on the back table.  Then, went ahead and trialed off of the real stem.  We went ahead and first drilled the lesser tuberosity with three drill holes and placed two #2 FiberWire sutures with a total of four strands coming out of the humerus for repair of the subscapularis directly to bone.  We then used impaction grafting technique and placed the global AP prosthesis size 14 body and then a 14 proximal component with pore coat and impacted that in 30 degrees of retroversion with the available bone graft medially for a nice secure fit.  We then trialed with a 48 x 18 eccentric humeral head component, that gave [place] good coverage and went up nicely to the inferior portion of the repaired rotator cuff tendon and then we were able to assess the stability shoulder, which was quite stable and I was able to displace the humeral head 50% posteriorly on the glenoid and 50% inferiorly. This was appropriate tension.  We removed the trial head, then impacted the real 48 x 18 eccentric head into position with best coverage.  We went ahead and secured our rotator cuff reinforcements, tying of the lateral sutures.  We then went ahead and repaired the subscapularis anatomically back to the lesser tuberosity and also repaired the rotator interval.  We had nice stable shoulder, everything moving securely together as a unit.  We thoroughly irrigated the shoulder and then repaired the deltopectoral interval with 0 Vicryl suture followed by 2-0 Vicryl in the subcutaneous closure and 4-0 Monocryl for skin.  Steri- Strips was applied followed  by a sterile dressing.  The patient tolerated the surgery well.     Doran Heater. Veverly Fells, M.D.     SRN/MEDQ  D:  10/14/2014  T:  10/15/2014  Job:  [id number]

## 2014-10-15 NOTE — Anesthesia Postprocedure Evaluation (Signed)
Anesthesia Post Note  Patient: Kyle Bowen  Procedure(s) Performed: Procedure(s) (LRB): RIGHT TOTAL SHOULDER ARTHROPLASTY (Right)  Anesthesia type: General  Patient location: PACU  Post pain: Pain level controlled and Adequate analgesia  Post assessment: Post-op Vital signs reviewed, Patient's Cardiovascular Status Stable, Respiratory Function Stable, Patent Airway and Pain level controlled  Last Vitals:  Filed Vitals:   10/15/14 0608  BP: 111/65  Pulse: 65  Temp: 36.9 C  Resp:     Post vital signs: Reviewed and stable  Level of consciousness: awake, alert  and oriented  Complications: No apparent anesthesia complications

## 2014-10-15 NOTE — Progress Notes (Signed)
Occupational Therapy Treatment and Discharge Patient Details Name: Kyle Bowen MRN: [id number] DOB: 1946/11/20 Today's Date: 10/15/2014    History of present illness Pt s/p RTSA   OT comments  This 68 yo male seen for second session today to finish going over exercises and ADLs since I was unable to do this with him this AM due to orthostatic hypotension. All education completed and no further questions concerns from pt, we wild D/C from OT.  Follow Up Recommendations   (follow up per MD)    Equipment Recommendations  None recommended by OT       Precautions / Restrictions Precautions Precautions: Shoulder Shoulder Interventions: Shoulder sling/immobilizer;Off for dressing/bathing/exercises Required Braces or Orthoses: Sling Restrictions Weight Bearing Restrictions: Yes RUE Weight Bearing: Non weight bearing       Mobility Bed Mobility Overal bed mobility: Modified Independent                Transfers Overall transfer level: Needs assistance Equipment used: None Transfers: Sit to/from Stand Sit to Stand: Supervision                                  Cognition   Behavior During Therapy: [facility] for tasks assessed/performed Overall Cognitive Status: Within Functional Limits for tasks assessed                         Exercises Other Exercises Other Exercises: Pt preformed 10 reps of all 4 pendulum exercises and was able to look at the other exercise sheet and verbally tell me how he was to do them    Shoulder Instructions Shoulder Instructions Donning/doffing shirt without moving shoulder: Patient able to independently direct caregiver Method for sponge bathing under operated UE: Patient able to independently direct caregiver Donning/doffing sling/immobilizer: Patient able to independently direct caregiver Correct positioning of sling/immobilizer: Patient able to independently direct caregiver Pendulum exercises (written home exercise  program): Supervision/safety ROM for elbow, wrist and digits of operated UE: Supervision/safety Sling wearing schedule (on at all times/off for ADL's): Independent Proper positioning of operated UE when showering: Independent Dressing change:  (NA) Positioning of UE while sleeping: Patient able to independently direct caregiver          Pertinent Vitals/ Pain       Pain Assessment: No/denies pain         Frequency Min 2X/week     Progress Toward Goals  OT Goals(current goals can now be found in the care plan section)  Progress towards OT goals:  (All education completed )     Plan Discharge plan remains appropriate       End of Session Equipment Utilized During Treatment:  (sling)   Activity Tolerance Patient tolerated treatment well (no BP issues this PM)   Patient Left in chair;with call bell/phone within reach;with family/visitor present   Nurse Communication  (Pt ready to go from OT standpoint)        Time: 0865-7846 OT Time Calculation (min): 19 min  Charges: OT General Charges $OT Visit: 1 Procedure OT Evaluation $Initial OT Evaluation Tier I: 1 Procedure OT Treatments $Therapeutic Exercise: 8-22 mins  Almon Register [phone] 10/15/2014, 3:28 PM

## 2014-10-15 NOTE — Discharge Summary (Signed)
Physician Discharge Summary   Patient ID: Kyle Bowen MRN: [id number] DOB/AGE: 05-Aug-1946 68 y.o.  Admit date: 10/14/2014 Discharge date: 10/15/2014  Admission Diagnoses:  Active Problems:   S/P shoulder replacement   Discharge Diagnoses:  Same   Surgeries: Procedure(s): RIGHT TOTAL SHOULDER ARTHROPLASTY on 10/14/2014   Consultants: OT  Discharged Condition: Stable  Hospital Course: [name] is an 68 y.o. male who was admitted 10/14/2014 with a chief complaint of right shoulder pain, and found to have a diagnosis of right shoulder OA.  They were brought to the operating room on 10/14/2014 and underwent the above named procedures.    The patient had an uncomplicated hospital course and was stable for discharge.  Recent vital signs:  Filed Vitals:   10/15/14 0608  BP: 111/65  Pulse: 65  Temp: 98.5 F (36.9 C)  Resp:     Recent laboratory studies:  Results for orders placed or performed during the hospital encounter of 10/14/14  Hemoglobin and hematocrit, blood  Result Value Ref Range   Hemoglobin 11.6 (L) 13.0 - 17.0 g/dL   HCT 33.9 (L) 39.0 - 20.2 %  Basic metabolic panel  Result Value Ref Range   Sodium 135 (L) 137 - 147 mEq/L   Potassium 4.1 3.7 - 5.3 mEq/L   Chloride 101 96 - 112 mEq/L   CO2 24 19 - 32 mEq/L   Glucose, Bld 127 (H) 70 - 99 mg/dL   BUN 11 6 - 23 mg/dL   Creatinine, Ser 0.84 0.50 - 1.35 mg/dL   Calcium 8.3 (L) 8.4 - 10.5 mg/dL   GFR calc non Af Amer 87 (L) >90 mL/min   GFR calc Af Amer >90 >90 mL/min   Anion gap 10 5 - 15    Discharge Medications:     Medication List    TAKE these medications        aspirin 81 MG tablet  Take 81 mg by mouth daily.     calcium carbonate 600 MG Tabs tablet  Commonly known as:  OS-CAL  Take 600 mg by mouth 2 (two) times daily with a meal.     Fish Oil 300 MG Caps  Take 1 capsule by mouth daily.     meloxicam 15 MG tablet  Commonly known as:  MOBIC  Take 15 mg by mouth daily.       MENS MULTI VITAMIN & MINERAL Tabs  Take 1 tablet by mouth daily.     methocarbamol 500 MG tablet  Commonly known as:  ROBAXIN  Take 1 tablet (500 mg total) by mouth 3 (three) times daily as needed.     oxyCODONE-acetaminophen 5-325 MG per tablet  Commonly known as:  ROXICET  Take 1-2 tablets by mouth every 4 (four) hours as needed for severe pain.     simvastatin 40 MG tablet  Commonly known as:  ZOCOR  Take 40 mg by mouth daily.        Diagnostic Studies: Dg Shoulder Right  10/14/2014   CLINICAL DATA:  Postop right shoulder replacement  EXAM: RIGHT SHOULDER - 2+ VIEW  COMPARISON:  None.  FINDINGS: Right shoulder arthroplasty in satisfactory position.  Mild degenerative changes of the acromioclavicular joint.  Visualized right lung is clear.  IMPRESSION: Right shoulder arthroplasty in satisfactory position.   Electronically Signed   By: Julian Hy M.D.   On: 10/14/2014 17:17    Disposition: home        Follow-up Information    Follow up  with Augustin Schooling, MD. Call in 2 weeks.   Specialty:  Orthopedic Surgery   Why:  [phone]   Contact information:   [address] [place] [address] [phone]        Signed: Augustin Schooling 10/15/2014, 7:47 AM

## 2014-10-15 NOTE — Progress Notes (Signed)
Orthopedics Progress Note  Subjective: Pain well controlled. Tolerating PO well  Objective:  Filed Vitals:   10/15/14 0608  BP: 111/65  Pulse: 65  Temp: 98.5 F (36.9 C)  Resp:     General: Awake and alert  Musculoskeletal: right shoulder dressing CDI, NVI Neurovascularly intact  Lab Results  Component Value Date   WBC 8.5 10/04/2014   HGB 11.6* 10/15/2014   HCT 33.9* 10/15/2014   MCV 90.8 10/04/2014   PLT 177 10/04/2014       Component Value Date/Time   NA 135* 10/15/2014 0558   K 4.1 10/15/2014 0558   CL 101 10/15/2014 0558   CO2 24 10/15/2014 0558   GLUCOSE 127* 10/15/2014 0558   BUN 11 10/15/2014 0558   CREATININE 0.84 10/15/2014 0558   CALCIUM 8.3* 10/15/2014 0558   GFRNONAA 87* 10/15/2014 0558   GFRAA >90 10/15/2014 0558    No results found for: INR, PROTIME  Assessment/Plan: POD #1 s/p Procedure(s): RIGHT TOTAL SHOULDER ARTHROPLASTY Stable for D/C home after OT Needs gel dressings for D/C  Remo Lipps R. Veverly Fells, MD 10/15/2014 7:44 AM

## 2014-10-15 NOTE — Evaluation (Signed)
Occupational Therapy Evaluation Patient Details Name: Kyle Bowen MRN: [id number] DOB: March 09, 1946 Today's Date: 10/15/2014    History of Present Illness Pt s/p RTSA   Clinical Impression   This 68 yo male admitted and underwent above presents to acute OT with orthostatic hypotension really interfering with progress and education this AM (pt forewarned me that in the past anytime he has had to have anethesia that he has passed out or almost passed out when he got up). Pt able to sit at EOB about 3 minutes before he said he had to lay back down, I let him rest in supine and then we got directly up from the bed to the recliner so as to try and work on his BP from a reclined position to a sitting position. PAS hose and ted hose were applied while he was sitting in recliner. Once in recliner and back half way reclined I took his BP and it was 81/46, O2 sats 96%, and HR 54. I waited 2 minutes and tried again with close to the same numbers at this point I called the nurse and she came to the room. I adjusted pt's head up a little further just to see what would occur BP wise, waited 2 minutes and then took his BP it was coming up. When I left pt was sitting straight up and feet down (PAS and TED hose on) and BP was 107/64 and pt no longer diaphoretic--nursing made aware. This whole process to get from original BP of 81/46 to 107/64 with slowly increasing pt's uprightness took almost 30 minutes. Will see pt again later today when wife returns.    Follow Up Recommendations   (follow up per MD)    Equipment Recommendations  None recommended by OT       Precautions / Restrictions Precautions Precautions: Shoulder Shoulder Interventions: Shoulder sling/immobilizer;Off for dressing/bathing/exercises Required Braces or Orthoses: Sling Restrictions Weight Bearing Restrictions: Yes RUE Weight Bearing: Non weight bearing      Mobility Bed Mobility Overal bed mobility: Modified Independent (HOB  up)                Transfers Overall transfer level: Needs assistance Equipment used: 1 person hand held assist Transfers: Sit to/from [facility] Sit to Stand: Min assist Stand pivot transfers: Min assist       General transfer comment: Due to BP issues                         Pertinent Vitals/Pain Pain Assessment: 0-10 Pain Score: 2  Pain Location: right shoulder Pain Descriptors / Indicators: Aching Pain Intervention(s): Monitored during session;Patient requesting pain meds-RN notified;Repositioned     Hand Dominance Right   Extremity/Trunk Assessment Upper Extremity Assessment Upper Extremity Assessment: RUE deficits/detail RUE Deficits / Details: shoulder sx this admission, rest WNL RUE Coordination: decreased gross motor           Communication Communication Communication: No difficulties   Cognition Arousal/Alertness: Awake/alert Behavior During Therapy: WFL for tasks assessed/performed Overall Cognitive Status: Within Functional Limits for tasks assessed                        Exercises   Other Exercises Other Exercises: Pt completed 10 rep of shoulder flexion (20 degrees) and 10 reps of external rotation (-50 degrees from neutral) supine in bed. He also completed 10 reps of forearm supination/pronation and elbow flexion semi-reclined in recliner   Shoulder  Instructions Shoulder Instructions Donning/doffing sling/immobilizer: Maximal assistance Correct positioning of sling/immobilizer: Maximal assistance Pendulum exercises (written home exercise program):  (unable to complete this session due to BP issues) ROM for elbow, wrist and digits of operated UE: Supervision/safety Sling wearing schedule (on at all times/off for ADL's): Independent Dressing change:  (NA) Positioning of UE while sleeping: Minimal assistance    Home Living Family/patient expects to be discharged to:: Private residence Living Arrangements:  Spouse/significant other Available Help at Discharge: Family Type of Home: House Home Access: Level entry     Home Layout: One level     Bathroom Shower/Tub: Chief Strategy Officer: None          Prior Functioning/Environment Level of Independence: Independent             OT Diagnosis: Generalized weakness;Acute pain   OT Problem List: Decreased strength;Decreased range of motion;Decreased activity tolerance;Impaired balance (sitting and/or standing);Cardiopulmonary status limiting activity;Pain;Impaired UE functional use   OT Treatment/Interventions: Self-care/ADL training;Patient/family education;Therapeutic exercise    OT Goals(Current goals can be found in the care plan section) Acute Rehab OT Goals Patient Stated Goal: home today hopefully OT Goal Formulation: With patient Time For Goal Achievement: 10/16/14 Potential to Achieve Goals: Good  OT Frequency: Min 2X/week              End of Session Equipment Utilized During Treatment:  (sling) Nurse Communication:  (BP issues and she came in to assess)  Activity Tolerance:  (limited by orthostatic hypotension) Patient left: in chair;with call bell/phone within reach   Time: 7681-1572 OT Time Calculation (min): 64 min Charges:  OT General Charges $OT Visit: 1 Procedure OT Evaluation $Initial OT Evaluation Tier I: 1 Procedure OT Treatments $Self Care/Home Management : 8-22 mins $Therapeutic Activity: 23-37 mins $Therapeutic Exercise: 8-22 mins  Almon Register [phone] 10/15/2014, 10:08 AM

## 2014-10-17 ENCOUNTER — Encounter (HOSPITAL_COMMUNITY): Payer: Self-pay | Admitting: Orthopedic Surgery

## 2014-10-17 NOTE — Progress Notes (Signed)
Utilization review completed.  

## 2014-10-18 DIAGNOSIS — K5900 Constipation, unspecified: Secondary | ICD-10-CM | POA: Diagnosis not present

## 2014-10-18 DIAGNOSIS — M19211 Secondary osteoarthritis, right shoulder: Secondary | ICD-10-CM | POA: Diagnosis not present

## 2014-10-18 DIAGNOSIS — Z471 Aftercare following joint replacement surgery: Secondary | ICD-10-CM | POA: Diagnosis not present

## 2014-10-18 DIAGNOSIS — Z96611 Presence of right artificial shoulder joint: Secondary | ICD-10-CM | POA: Diagnosis not present

## 2014-10-18 DIAGNOSIS — Z9181 History of falling: Secondary | ICD-10-CM | POA: Diagnosis not present

## 2014-10-19 DIAGNOSIS — Z9181 History of falling: Secondary | ICD-10-CM | POA: Diagnosis not present

## 2014-10-19 DIAGNOSIS — Z471 Aftercare following joint replacement surgery: Secondary | ICD-10-CM | POA: Diagnosis not present

## 2014-10-19 DIAGNOSIS — Z96611 Presence of right artificial shoulder joint: Secondary | ICD-10-CM | POA: Diagnosis not present

## 2014-10-24 DIAGNOSIS — Z96611 Presence of right artificial shoulder joint: Secondary | ICD-10-CM | POA: Diagnosis not present

## 2014-10-24 DIAGNOSIS — Z9181 History of falling: Secondary | ICD-10-CM | POA: Diagnosis not present

## 2014-10-24 DIAGNOSIS — Z471 Aftercare following joint replacement surgery: Secondary | ICD-10-CM | POA: Diagnosis not present

## 2014-10-26 DIAGNOSIS — Z471 Aftercare following joint replacement surgery: Secondary | ICD-10-CM | POA: Diagnosis not present

## 2014-10-26 DIAGNOSIS — Z9181 History of falling: Secondary | ICD-10-CM | POA: Diagnosis not present

## 2014-10-26 DIAGNOSIS — Z96611 Presence of right artificial shoulder joint: Secondary | ICD-10-CM | POA: Diagnosis not present

## 2014-10-28 DIAGNOSIS — Z471 Aftercare following joint replacement surgery: Secondary | ICD-10-CM | POA: Diagnosis not present

## 2014-10-28 DIAGNOSIS — Z96611 Presence of right artificial shoulder joint: Secondary | ICD-10-CM | POA: Diagnosis not present

## 2014-10-28 DIAGNOSIS — Z9181 History of falling: Secondary | ICD-10-CM | POA: Diagnosis not present

## 2014-11-01 DIAGNOSIS — Z96611 Presence of right artificial shoulder joint: Secondary | ICD-10-CM | POA: Diagnosis not present

## 2014-11-01 DIAGNOSIS — Z471 Aftercare following joint replacement surgery: Secondary | ICD-10-CM | POA: Diagnosis not present

## 2014-11-01 DIAGNOSIS — Z9181 History of falling: Secondary | ICD-10-CM | POA: Diagnosis not present

## 2014-11-03 DIAGNOSIS — Z9181 History of falling: Secondary | ICD-10-CM | POA: Diagnosis not present

## 2014-11-03 DIAGNOSIS — Z96611 Presence of right artificial shoulder joint: Secondary | ICD-10-CM | POA: Diagnosis not present

## 2014-11-03 DIAGNOSIS — Z471 Aftercare following joint replacement surgery: Secondary | ICD-10-CM | POA: Diagnosis not present

## 2014-11-04 DIAGNOSIS — Z96611 Presence of right artificial shoulder joint: Secondary | ICD-10-CM | POA: Diagnosis not present

## 2014-11-04 DIAGNOSIS — Z9181 History of falling: Secondary | ICD-10-CM | POA: Diagnosis not present

## 2014-11-04 DIAGNOSIS — Z471 Aftercare following joint replacement surgery: Secondary | ICD-10-CM | POA: Diagnosis not present

## 2014-11-08 DIAGNOSIS — Z471 Aftercare following joint replacement surgery: Secondary | ICD-10-CM | POA: Diagnosis not present

## 2014-11-08 DIAGNOSIS — Z9181 History of falling: Secondary | ICD-10-CM | POA: Diagnosis not present

## 2014-11-08 DIAGNOSIS — Z96611 Presence of right artificial shoulder joint: Secondary | ICD-10-CM | POA: Diagnosis not present

## 2014-11-10 DIAGNOSIS — Z9181 History of falling: Secondary | ICD-10-CM | POA: Diagnosis not present

## 2014-11-10 DIAGNOSIS — Z471 Aftercare following joint replacement surgery: Secondary | ICD-10-CM | POA: Diagnosis not present

## 2014-11-10 DIAGNOSIS — Z96611 Presence of right artificial shoulder joint: Secondary | ICD-10-CM | POA: Diagnosis not present

## 2014-11-11 DIAGNOSIS — Z9181 History of falling: Secondary | ICD-10-CM | POA: Diagnosis not present

## 2014-11-11 DIAGNOSIS — Z471 Aftercare following joint replacement surgery: Secondary | ICD-10-CM | POA: Diagnosis not present

## 2014-11-11 DIAGNOSIS — Z96611 Presence of right artificial shoulder joint: Secondary | ICD-10-CM | POA: Diagnosis not present

## 2014-11-14 DIAGNOSIS — Z96611 Presence of right artificial shoulder joint: Secondary | ICD-10-CM | POA: Diagnosis not present

## 2014-11-14 DIAGNOSIS — M25511 Pain in right shoulder: Secondary | ICD-10-CM | POA: Diagnosis not present

## 2014-11-14 DIAGNOSIS — M25411 Effusion, right shoulder: Secondary | ICD-10-CM | POA: Diagnosis not present

## 2014-11-14 DIAGNOSIS — R531 Weakness: Secondary | ICD-10-CM | POA: Diagnosis not present

## 2014-11-21 DIAGNOSIS — Z96611 Presence of right artificial shoulder joint: Secondary | ICD-10-CM | POA: Diagnosis not present

## 2014-11-21 DIAGNOSIS — M25411 Effusion, right shoulder: Secondary | ICD-10-CM | POA: Diagnosis not present

## 2014-11-21 DIAGNOSIS — M25511 Pain in right shoulder: Secondary | ICD-10-CM | POA: Diagnosis not present

## 2014-11-21 DIAGNOSIS — R531 Weakness: Secondary | ICD-10-CM | POA: Diagnosis not present

## 2014-11-28 DIAGNOSIS — Z96611 Presence of right artificial shoulder joint: Secondary | ICD-10-CM | POA: Diagnosis not present

## 2014-11-28 DIAGNOSIS — M25511 Pain in right shoulder: Secondary | ICD-10-CM | POA: Diagnosis not present

## 2014-11-28 DIAGNOSIS — M25411 Effusion, right shoulder: Secondary | ICD-10-CM | POA: Diagnosis not present

## 2014-11-28 DIAGNOSIS — R531 Weakness: Secondary | ICD-10-CM | POA: Diagnosis not present

## 2014-11-29 DIAGNOSIS — Z471 Aftercare following joint replacement surgery: Secondary | ICD-10-CM | POA: Diagnosis not present

## 2014-11-29 DIAGNOSIS — M19011 Primary osteoarthritis, right shoulder: Secondary | ICD-10-CM | POA: Diagnosis not present

## 2014-11-29 DIAGNOSIS — Z96611 Presence of right artificial shoulder joint: Secondary | ICD-10-CM | POA: Diagnosis not present

## 2014-11-30 DIAGNOSIS — R531 Weakness: Secondary | ICD-10-CM | POA: Diagnosis not present

## 2014-11-30 DIAGNOSIS — Z96611 Presence of right artificial shoulder joint: Secondary | ICD-10-CM | POA: Diagnosis not present

## 2014-11-30 DIAGNOSIS — M25511 Pain in right shoulder: Secondary | ICD-10-CM | POA: Diagnosis not present

## 2014-11-30 DIAGNOSIS — M25411 Effusion, right shoulder: Secondary | ICD-10-CM | POA: Diagnosis not present

## 2014-12-02 HISTORY — PX: JOINT REPLACEMENT: SHX530

## 2014-12-06 DIAGNOSIS — M25411 Effusion, right shoulder: Secondary | ICD-10-CM | POA: Diagnosis not present

## 2014-12-06 DIAGNOSIS — M25511 Pain in right shoulder: Secondary | ICD-10-CM | POA: Diagnosis not present

## 2014-12-06 DIAGNOSIS — R531 Weakness: Secondary | ICD-10-CM | POA: Diagnosis not present

## 2014-12-06 DIAGNOSIS — Z96611 Presence of right artificial shoulder joint: Secondary | ICD-10-CM | POA: Diagnosis not present

## 2014-12-08 DIAGNOSIS — M25411 Effusion, right shoulder: Secondary | ICD-10-CM | POA: Diagnosis not present

## 2014-12-08 DIAGNOSIS — Z96611 Presence of right artificial shoulder joint: Secondary | ICD-10-CM | POA: Diagnosis not present

## 2014-12-08 DIAGNOSIS — R531 Weakness: Secondary | ICD-10-CM | POA: Diagnosis not present

## 2014-12-08 DIAGNOSIS — M25511 Pain in right shoulder: Secondary | ICD-10-CM | POA: Diagnosis not present

## 2014-12-12 DIAGNOSIS — M25511 Pain in right shoulder: Secondary | ICD-10-CM | POA: Diagnosis not present

## 2014-12-12 DIAGNOSIS — R531 Weakness: Secondary | ICD-10-CM | POA: Diagnosis not present

## 2014-12-12 DIAGNOSIS — Z96611 Presence of right artificial shoulder joint: Secondary | ICD-10-CM | POA: Diagnosis not present

## 2014-12-12 DIAGNOSIS — M25411 Effusion, right shoulder: Secondary | ICD-10-CM | POA: Diagnosis not present

## 2014-12-15 DIAGNOSIS — M25411 Effusion, right shoulder: Secondary | ICD-10-CM | POA: Diagnosis not present

## 2014-12-15 DIAGNOSIS — R531 Weakness: Secondary | ICD-10-CM | POA: Diagnosis not present

## 2014-12-15 DIAGNOSIS — M25511 Pain in right shoulder: Secondary | ICD-10-CM | POA: Diagnosis not present

## 2014-12-15 DIAGNOSIS — Z96611 Presence of right artificial shoulder joint: Secondary | ICD-10-CM | POA: Diagnosis not present

## 2014-12-19 DIAGNOSIS — Z96611 Presence of right artificial shoulder joint: Secondary | ICD-10-CM | POA: Diagnosis not present

## 2014-12-19 DIAGNOSIS — M25411 Effusion, right shoulder: Secondary | ICD-10-CM | POA: Diagnosis not present

## 2014-12-19 DIAGNOSIS — M25511 Pain in right shoulder: Secondary | ICD-10-CM | POA: Diagnosis not present

## 2014-12-19 DIAGNOSIS — R531 Weakness: Secondary | ICD-10-CM | POA: Diagnosis not present

## 2014-12-22 DIAGNOSIS — Z96611 Presence of right artificial shoulder joint: Secondary | ICD-10-CM | POA: Diagnosis not present

## 2014-12-22 DIAGNOSIS — M25511 Pain in right shoulder: Secondary | ICD-10-CM | POA: Diagnosis not present

## 2014-12-22 DIAGNOSIS — R531 Weakness: Secondary | ICD-10-CM | POA: Diagnosis not present

## 2014-12-22 DIAGNOSIS — M25411 Effusion, right shoulder: Secondary | ICD-10-CM | POA: Diagnosis not present

## 2014-12-26 DIAGNOSIS — M25411 Effusion, right shoulder: Secondary | ICD-10-CM | POA: Diagnosis not present

## 2014-12-26 DIAGNOSIS — M25511 Pain in right shoulder: Secondary | ICD-10-CM | POA: Diagnosis not present

## 2014-12-26 DIAGNOSIS — R531 Weakness: Secondary | ICD-10-CM | POA: Diagnosis not present

## 2014-12-26 DIAGNOSIS — Z96611 Presence of right artificial shoulder joint: Secondary | ICD-10-CM | POA: Diagnosis not present

## 2014-12-27 DIAGNOSIS — Z471 Aftercare following joint replacement surgery: Secondary | ICD-10-CM | POA: Diagnosis not present

## 2014-12-27 DIAGNOSIS — Z96611 Presence of right artificial shoulder joint: Secondary | ICD-10-CM | POA: Diagnosis not present

## 2014-12-30 DIAGNOSIS — H524 Presbyopia: Secondary | ICD-10-CM | POA: Diagnosis not present

## 2015-01-02 DIAGNOSIS — M25511 Pain in right shoulder: Secondary | ICD-10-CM | POA: Diagnosis not present

## 2015-01-02 DIAGNOSIS — Z96611 Presence of right artificial shoulder joint: Secondary | ICD-10-CM | POA: Diagnosis not present

## 2015-01-02 DIAGNOSIS — R531 Weakness: Secondary | ICD-10-CM | POA: Diagnosis not present

## 2015-01-02 DIAGNOSIS — M25411 Effusion, right shoulder: Secondary | ICD-10-CM | POA: Diagnosis not present

## 2015-01-09 DIAGNOSIS — M25511 Pain in right shoulder: Secondary | ICD-10-CM | POA: Diagnosis not present

## 2015-01-09 DIAGNOSIS — R531 Weakness: Secondary | ICD-10-CM | POA: Diagnosis not present

## 2015-01-09 DIAGNOSIS — Z96611 Presence of right artificial shoulder joint: Secondary | ICD-10-CM | POA: Diagnosis not present

## 2015-01-09 DIAGNOSIS — M25411 Effusion, right shoulder: Secondary | ICD-10-CM | POA: Diagnosis not present

## 2015-01-17 DIAGNOSIS — R531 Weakness: Secondary | ICD-10-CM | POA: Diagnosis not present

## 2015-01-17 DIAGNOSIS — Z96611 Presence of right artificial shoulder joint: Secondary | ICD-10-CM | POA: Diagnosis not present

## 2015-01-17 DIAGNOSIS — M25411 Effusion, right shoulder: Secondary | ICD-10-CM | POA: Diagnosis not present

## 2015-01-17 DIAGNOSIS — M25511 Pain in right shoulder: Secondary | ICD-10-CM | POA: Diagnosis not present

## 2015-01-23 DIAGNOSIS — Z96611 Presence of right artificial shoulder joint: Secondary | ICD-10-CM | POA: Diagnosis not present

## 2015-01-23 DIAGNOSIS — R531 Weakness: Secondary | ICD-10-CM | POA: Diagnosis not present

## 2015-01-23 DIAGNOSIS — M25411 Effusion, right shoulder: Secondary | ICD-10-CM | POA: Diagnosis not present

## 2015-01-23 DIAGNOSIS — M25511 Pain in right shoulder: Secondary | ICD-10-CM | POA: Diagnosis not present

## 2015-01-30 DIAGNOSIS — Z96611 Presence of right artificial shoulder joint: Secondary | ICD-10-CM | POA: Diagnosis not present

## 2015-01-30 DIAGNOSIS — M25411 Effusion, right shoulder: Secondary | ICD-10-CM | POA: Diagnosis not present

## 2015-01-30 DIAGNOSIS — R531 Weakness: Secondary | ICD-10-CM | POA: Diagnosis not present

## 2015-01-30 DIAGNOSIS — M25511 Pain in right shoulder: Secondary | ICD-10-CM | POA: Diagnosis not present

## 2015-02-06 DIAGNOSIS — M25411 Effusion, right shoulder: Secondary | ICD-10-CM | POA: Diagnosis not present

## 2015-02-06 DIAGNOSIS — R531 Weakness: Secondary | ICD-10-CM | POA: Diagnosis not present

## 2015-02-06 DIAGNOSIS — M25511 Pain in right shoulder: Secondary | ICD-10-CM | POA: Diagnosis not present

## 2015-02-06 DIAGNOSIS — Z96611 Presence of right artificial shoulder joint: Secondary | ICD-10-CM | POA: Diagnosis not present

## 2015-02-07 DIAGNOSIS — Z9660 Presence of unspecified orthopedic joint implant: Secondary | ICD-10-CM | POA: Diagnosis not present

## 2015-02-07 DIAGNOSIS — M19011 Primary osteoarthritis, right shoulder: Secondary | ICD-10-CM | POA: Diagnosis not present

## 2015-02-07 DIAGNOSIS — Z471 Aftercare following joint replacement surgery: Secondary | ICD-10-CM | POA: Diagnosis not present

## 2015-02-07 DIAGNOSIS — Z96611 Presence of right artificial shoulder joint: Secondary | ICD-10-CM | POA: Diagnosis not present

## 2015-02-27 DIAGNOSIS — M170 Bilateral primary osteoarthritis of knee: Secondary | ICD-10-CM | POA: Diagnosis not present

## 2015-04-11 DIAGNOSIS — M19011 Primary osteoarthritis, right shoulder: Secondary | ICD-10-CM | POA: Diagnosis not present

## 2015-04-11 DIAGNOSIS — Z96611 Presence of right artificial shoulder joint: Secondary | ICD-10-CM | POA: Diagnosis not present

## 2015-04-11 DIAGNOSIS — Z471 Aftercare following joint replacement surgery: Secondary | ICD-10-CM | POA: Diagnosis not present

## 2015-05-02 DIAGNOSIS — M170 Bilateral primary osteoarthritis of knee: Secondary | ICD-10-CM | POA: Diagnosis not present

## 2015-05-02 DIAGNOSIS — Z01818 Encounter for other preprocedural examination: Secondary | ICD-10-CM | POA: Diagnosis not present

## 2015-05-15 DIAGNOSIS — J302 Other seasonal allergic rhinitis: Secondary | ICD-10-CM | POA: Diagnosis not present

## 2015-05-15 DIAGNOSIS — M19211 Secondary osteoarthritis, right shoulder: Secondary | ICD-10-CM | POA: Diagnosis not present

## 2015-05-15 DIAGNOSIS — E780 Pure hypercholesterolemia: Secondary | ICD-10-CM | POA: Diagnosis not present

## 2015-05-15 DIAGNOSIS — N393 Stress incontinence (female) (male): Secondary | ICD-10-CM | POA: Diagnosis not present

## 2015-05-18 DIAGNOSIS — M1711 Unilateral primary osteoarthritis, right knee: Secondary | ICD-10-CM | POA: Diagnosis not present

## 2015-05-18 DIAGNOSIS — M1712 Unilateral primary osteoarthritis, left knee: Secondary | ICD-10-CM | POA: Diagnosis not present

## 2015-05-29 DIAGNOSIS — N5203 Combined arterial insufficiency and corporo-venous occlusive erectile dysfunction: Secondary | ICD-10-CM | POA: Diagnosis not present

## 2015-05-29 DIAGNOSIS — C61 Malignant neoplasm of prostate: Secondary | ICD-10-CM | POA: Diagnosis not present

## 2015-05-29 DIAGNOSIS — R972 Elevated prostate specific antigen [PSA]: Secondary | ICD-10-CM | POA: Diagnosis not present

## 2015-05-29 DIAGNOSIS — R5383 Other fatigue: Secondary | ICD-10-CM | POA: Diagnosis not present

## 2015-05-29 DIAGNOSIS — N3090 Cystitis, unspecified without hematuria: Secondary | ICD-10-CM | POA: Diagnosis not present

## 2015-05-29 DIAGNOSIS — N318 Other neuromuscular dysfunction of bladder: Secondary | ICD-10-CM | POA: Diagnosis not present

## 2015-07-10 DIAGNOSIS — N5203 Combined arterial insufficiency and corporo-venous occlusive erectile dysfunction: Secondary | ICD-10-CM | POA: Diagnosis not present

## 2015-07-10 DIAGNOSIS — N3090 Cystitis, unspecified without hematuria: Secondary | ICD-10-CM | POA: Diagnosis not present

## 2015-07-10 DIAGNOSIS — C61 Malignant neoplasm of prostate: Secondary | ICD-10-CM | POA: Diagnosis not present

## 2015-07-10 DIAGNOSIS — R972 Elevated prostate specific antigen [PSA]: Secondary | ICD-10-CM | POA: Diagnosis not present

## 2015-07-18 DIAGNOSIS — Z1389 Encounter for screening for other disorder: Secondary | ICD-10-CM | POA: Diagnosis not present

## 2015-07-18 DIAGNOSIS — Z9181 History of falling: Secondary | ICD-10-CM | POA: Diagnosis not present

## 2015-07-18 DIAGNOSIS — Z01818 Encounter for other preprocedural examination: Secondary | ICD-10-CM | POA: Diagnosis not present

## 2015-07-18 DIAGNOSIS — M1712 Unilateral primary osteoarthritis, left knee: Secondary | ICD-10-CM | POA: Diagnosis not present

## 2015-08-14 DIAGNOSIS — M25562 Pain in left knee: Secondary | ICD-10-CM | POA: Diagnosis not present

## 2015-08-14 DIAGNOSIS — M170 Bilateral primary osteoarthritis of knee: Secondary | ICD-10-CM | POA: Diagnosis not present

## 2015-08-14 DIAGNOSIS — M7122 Synovial cyst of popliteal space [Baker], left knee: Secondary | ICD-10-CM | POA: Diagnosis not present

## 2015-08-14 DIAGNOSIS — M1711 Unilateral primary osteoarthritis, right knee: Secondary | ICD-10-CM | POA: Diagnosis not present

## 2015-08-14 DIAGNOSIS — M2342 Loose body in knee, left knee: Secondary | ICD-10-CM | POA: Diagnosis not present

## 2015-08-23 DIAGNOSIS — R2689 Other abnormalities of gait and mobility: Secondary | ICD-10-CM | POA: Diagnosis not present

## 2015-08-23 DIAGNOSIS — M25562 Pain in left knee: Secondary | ICD-10-CM | POA: Diagnosis not present

## 2015-09-04 DIAGNOSIS — Z22322 Carrier or suspected carrier of Methicillin resistant Staphylococcus aureus: Secondary | ICD-10-CM | POA: Diagnosis not present

## 2015-09-04 DIAGNOSIS — M1712 Unilateral primary osteoarthritis, left knee: Secondary | ICD-10-CM | POA: Diagnosis not present

## 2015-09-04 DIAGNOSIS — G8929 Other chronic pain: Secondary | ICD-10-CM | POA: Diagnosis not present

## 2015-09-04 DIAGNOSIS — M25562 Pain in left knee: Secondary | ICD-10-CM | POA: Diagnosis not present

## 2015-10-12 DIAGNOSIS — Z79899 Other long term (current) drug therapy: Secondary | ICD-10-CM | POA: Diagnosis not present

## 2015-10-12 DIAGNOSIS — Z833 Family history of diabetes mellitus: Secondary | ICD-10-CM | POA: Diagnosis not present

## 2015-10-12 DIAGNOSIS — Z7982 Long term (current) use of aspirin: Secondary | ICD-10-CM | POA: Diagnosis not present

## 2015-10-12 DIAGNOSIS — Z8546 Personal history of malignant neoplasm of prostate: Secondary | ICD-10-CM | POA: Diagnosis not present

## 2015-10-12 DIAGNOSIS — Z96611 Presence of right artificial shoulder joint: Secondary | ICD-10-CM | POA: Diagnosis present

## 2015-10-12 DIAGNOSIS — Z820 Family history of epilepsy and other diseases of the nervous system: Secondary | ICD-10-CM | POA: Diagnosis not present

## 2015-10-12 DIAGNOSIS — Z791 Long term (current) use of non-steroidal anti-inflammatories (NSAID): Secondary | ICD-10-CM | POA: Diagnosis not present

## 2015-10-12 DIAGNOSIS — E7800 Pure hypercholesterolemia, unspecified: Secondary | ICD-10-CM | POA: Diagnosis present

## 2015-10-12 DIAGNOSIS — Z471 Aftercare following joint replacement surgery: Secondary | ICD-10-CM | POA: Diagnosis not present

## 2015-10-12 DIAGNOSIS — Z8249 Family history of ischemic heart disease and other diseases of the circulatory system: Secondary | ICD-10-CM | POA: Diagnosis not present

## 2015-10-12 DIAGNOSIS — Z96652 Presence of left artificial knee joint: Secondary | ICD-10-CM | POA: Diagnosis not present

## 2015-10-12 DIAGNOSIS — M1712 Unilateral primary osteoarthritis, left knee: Secondary | ICD-10-CM | POA: Diagnosis not present

## 2015-10-16 DIAGNOSIS — Z4801 Encounter for change or removal of surgical wound dressing: Secondary | ICD-10-CM | POA: Diagnosis not present

## 2015-10-16 DIAGNOSIS — Z8546 Personal history of malignant neoplasm of prostate: Secondary | ICD-10-CM | POA: Diagnosis not present

## 2015-10-16 DIAGNOSIS — Z471 Aftercare following joint replacement surgery: Secondary | ICD-10-CM | POA: Diagnosis not present

## 2015-10-16 DIAGNOSIS — M1990 Unspecified osteoarthritis, unspecified site: Secondary | ICD-10-CM | POA: Diagnosis not present

## 2015-10-16 DIAGNOSIS — Z96652 Presence of left artificial knee joint: Secondary | ICD-10-CM | POA: Diagnosis not present

## 2015-10-17 DIAGNOSIS — Z8546 Personal history of malignant neoplasm of prostate: Secondary | ICD-10-CM | POA: Diagnosis not present

## 2015-10-17 DIAGNOSIS — M1990 Unspecified osteoarthritis, unspecified site: Secondary | ICD-10-CM | POA: Diagnosis not present

## 2015-10-17 DIAGNOSIS — Z96652 Presence of left artificial knee joint: Secondary | ICD-10-CM | POA: Diagnosis not present

## 2015-10-17 DIAGNOSIS — Z4801 Encounter for change or removal of surgical wound dressing: Secondary | ICD-10-CM | POA: Diagnosis not present

## 2015-10-17 DIAGNOSIS — Z471 Aftercare following joint replacement surgery: Secondary | ICD-10-CM | POA: Diagnosis not present

## 2015-10-18 DIAGNOSIS — Z4801 Encounter for change or removal of surgical wound dressing: Secondary | ICD-10-CM | POA: Diagnosis not present

## 2015-10-18 DIAGNOSIS — M1990 Unspecified osteoarthritis, unspecified site: Secondary | ICD-10-CM | POA: Diagnosis not present

## 2015-10-18 DIAGNOSIS — Z96652 Presence of left artificial knee joint: Secondary | ICD-10-CM | POA: Diagnosis not present

## 2015-10-18 DIAGNOSIS — Z8546 Personal history of malignant neoplasm of prostate: Secondary | ICD-10-CM | POA: Diagnosis not present

## 2015-10-18 DIAGNOSIS — Z471 Aftercare following joint replacement surgery: Secondary | ICD-10-CM | POA: Diagnosis not present

## 2015-10-19 DIAGNOSIS — Z8546 Personal history of malignant neoplasm of prostate: Secondary | ICD-10-CM | POA: Diagnosis not present

## 2015-10-19 DIAGNOSIS — M1990 Unspecified osteoarthritis, unspecified site: Secondary | ICD-10-CM | POA: Diagnosis not present

## 2015-10-19 DIAGNOSIS — Z4801 Encounter for change or removal of surgical wound dressing: Secondary | ICD-10-CM | POA: Diagnosis not present

## 2015-10-19 DIAGNOSIS — Z96652 Presence of left artificial knee joint: Secondary | ICD-10-CM | POA: Diagnosis not present

## 2015-10-19 DIAGNOSIS — Z471 Aftercare following joint replacement surgery: Secondary | ICD-10-CM | POA: Diagnosis not present

## 2015-10-20 DIAGNOSIS — Z4801 Encounter for change or removal of surgical wound dressing: Secondary | ICD-10-CM | POA: Diagnosis not present

## 2015-10-20 DIAGNOSIS — Z8546 Personal history of malignant neoplasm of prostate: Secondary | ICD-10-CM | POA: Diagnosis not present

## 2015-10-20 DIAGNOSIS — Z471 Aftercare following joint replacement surgery: Secondary | ICD-10-CM | POA: Diagnosis not present

## 2015-10-20 DIAGNOSIS — Z96652 Presence of left artificial knee joint: Secondary | ICD-10-CM | POA: Diagnosis not present

## 2015-10-20 DIAGNOSIS — M1990 Unspecified osteoarthritis, unspecified site: Secondary | ICD-10-CM | POA: Diagnosis not present

## 2015-10-23 DIAGNOSIS — R2689 Other abnormalities of gait and mobility: Secondary | ICD-10-CM | POA: Diagnosis not present

## 2015-10-23 DIAGNOSIS — M62552 Muscle wasting and atrophy, not elsewhere classified, left thigh: Secondary | ICD-10-CM | POA: Diagnosis not present

## 2015-10-23 DIAGNOSIS — Z96652 Presence of left artificial knee joint: Secondary | ICD-10-CM | POA: Diagnosis not present

## 2015-10-23 DIAGNOSIS — M25462 Effusion, left knee: Secondary | ICD-10-CM | POA: Diagnosis not present

## 2015-10-25 DIAGNOSIS — M25462 Effusion, left knee: Secondary | ICD-10-CM | POA: Diagnosis not present

## 2015-10-25 DIAGNOSIS — M62552 Muscle wasting and atrophy, not elsewhere classified, left thigh: Secondary | ICD-10-CM | POA: Diagnosis not present

## 2015-10-25 DIAGNOSIS — Z96652 Presence of left artificial knee joint: Secondary | ICD-10-CM | POA: Diagnosis not present

## 2015-10-25 DIAGNOSIS — R2689 Other abnormalities of gait and mobility: Secondary | ICD-10-CM | POA: Diagnosis not present

## 2015-10-30 DIAGNOSIS — Z96652 Presence of left artificial knee joint: Secondary | ICD-10-CM | POA: Diagnosis not present

## 2015-10-30 DIAGNOSIS — R2689 Other abnormalities of gait and mobility: Secondary | ICD-10-CM | POA: Diagnosis not present

## 2015-10-30 DIAGNOSIS — M25462 Effusion, left knee: Secondary | ICD-10-CM | POA: Diagnosis not present

## 2015-10-30 DIAGNOSIS — M62552 Muscle wasting and atrophy, not elsewhere classified, left thigh: Secondary | ICD-10-CM | POA: Diagnosis not present

## 2015-11-01 DIAGNOSIS — R2689 Other abnormalities of gait and mobility: Secondary | ICD-10-CM | POA: Diagnosis not present

## 2015-11-01 DIAGNOSIS — M25462 Effusion, left knee: Secondary | ICD-10-CM | POA: Diagnosis not present

## 2015-11-01 DIAGNOSIS — Z96652 Presence of left artificial knee joint: Secondary | ICD-10-CM | POA: Diagnosis not present

## 2015-11-01 DIAGNOSIS — M62552 Muscle wasting and atrophy, not elsewhere classified, left thigh: Secondary | ICD-10-CM | POA: Diagnosis not present

## 2015-11-03 DIAGNOSIS — Z96652 Presence of left artificial knee joint: Secondary | ICD-10-CM | POA: Diagnosis not present

## 2015-11-03 DIAGNOSIS — M25462 Effusion, left knee: Secondary | ICD-10-CM | POA: Diagnosis not present

## 2015-11-03 DIAGNOSIS — R2689 Other abnormalities of gait and mobility: Secondary | ICD-10-CM | POA: Diagnosis not present

## 2015-11-03 DIAGNOSIS — M62552 Muscle wasting and atrophy, not elsewhere classified, left thigh: Secondary | ICD-10-CM | POA: Diagnosis not present

## 2015-11-06 DIAGNOSIS — M25462 Effusion, left knee: Secondary | ICD-10-CM | POA: Diagnosis not present

## 2015-11-06 DIAGNOSIS — R2689 Other abnormalities of gait and mobility: Secondary | ICD-10-CM | POA: Diagnosis not present

## 2015-11-06 DIAGNOSIS — Z96652 Presence of left artificial knee joint: Secondary | ICD-10-CM | POA: Diagnosis not present

## 2015-11-06 DIAGNOSIS — M62552 Muscle wasting and atrophy, not elsewhere classified, left thigh: Secondary | ICD-10-CM | POA: Diagnosis not present

## 2015-11-08 DIAGNOSIS — Z96652 Presence of left artificial knee joint: Secondary | ICD-10-CM | POA: Diagnosis not present

## 2015-11-08 DIAGNOSIS — M25462 Effusion, left knee: Secondary | ICD-10-CM | POA: Diagnosis not present

## 2015-11-08 DIAGNOSIS — M62552 Muscle wasting and atrophy, not elsewhere classified, left thigh: Secondary | ICD-10-CM | POA: Diagnosis not present

## 2015-11-08 DIAGNOSIS — N3000 Acute cystitis without hematuria: Secondary | ICD-10-CM | POA: Diagnosis not present

## 2015-11-08 DIAGNOSIS — Z0000 Encounter for general adult medical examination without abnormal findings: Secondary | ICD-10-CM | POA: Diagnosis not present

## 2015-11-08 DIAGNOSIS — Z23 Encounter for immunization: Secondary | ICD-10-CM | POA: Diagnosis not present

## 2015-11-08 DIAGNOSIS — R2689 Other abnormalities of gait and mobility: Secondary | ICD-10-CM | POA: Diagnosis not present

## 2015-11-10 DIAGNOSIS — M25462 Effusion, left knee: Secondary | ICD-10-CM | POA: Diagnosis not present

## 2015-11-10 DIAGNOSIS — M62552 Muscle wasting and atrophy, not elsewhere classified, left thigh: Secondary | ICD-10-CM | POA: Diagnosis not present

## 2015-11-10 DIAGNOSIS — Z96652 Presence of left artificial knee joint: Secondary | ICD-10-CM | POA: Diagnosis not present

## 2015-11-10 DIAGNOSIS — R2689 Other abnormalities of gait and mobility: Secondary | ICD-10-CM | POA: Diagnosis not present

## 2015-11-13 DIAGNOSIS — M62552 Muscle wasting and atrophy, not elsewhere classified, left thigh: Secondary | ICD-10-CM | POA: Diagnosis not present

## 2015-11-13 DIAGNOSIS — Z96652 Presence of left artificial knee joint: Secondary | ICD-10-CM | POA: Diagnosis not present

## 2015-11-13 DIAGNOSIS — R2689 Other abnormalities of gait and mobility: Secondary | ICD-10-CM | POA: Diagnosis not present

## 2015-11-13 DIAGNOSIS — M25462 Effusion, left knee: Secondary | ICD-10-CM | POA: Diagnosis not present

## 2015-11-15 DIAGNOSIS — M25462 Effusion, left knee: Secondary | ICD-10-CM | POA: Diagnosis not present

## 2015-11-15 DIAGNOSIS — Z96652 Presence of left artificial knee joint: Secondary | ICD-10-CM | POA: Diagnosis not present

## 2015-11-15 DIAGNOSIS — R2689 Other abnormalities of gait and mobility: Secondary | ICD-10-CM | POA: Diagnosis not present

## 2015-11-15 DIAGNOSIS — M62552 Muscle wasting and atrophy, not elsewhere classified, left thigh: Secondary | ICD-10-CM | POA: Diagnosis not present

## 2015-11-16 DIAGNOSIS — C61 Malignant neoplasm of prostate: Secondary | ICD-10-CM | POA: Diagnosis not present

## 2015-11-16 DIAGNOSIS — R972 Elevated prostate specific antigen [PSA]: Secondary | ICD-10-CM | POA: Diagnosis not present

## 2015-11-16 DIAGNOSIS — N318 Other neuromuscular dysfunction of bladder: Secondary | ICD-10-CM | POA: Diagnosis not present

## 2015-11-16 DIAGNOSIS — N3020 Other chronic cystitis without hematuria: Secondary | ICD-10-CM | POA: Diagnosis not present

## 2015-11-16 DIAGNOSIS — N5203 Combined arterial insufficiency and corporo-venous occlusive erectile dysfunction: Secondary | ICD-10-CM | POA: Diagnosis not present

## 2015-11-21 DIAGNOSIS — M62552 Muscle wasting and atrophy, not elsewhere classified, left thigh: Secondary | ICD-10-CM | POA: Diagnosis not present

## 2015-11-21 DIAGNOSIS — R2689 Other abnormalities of gait and mobility: Secondary | ICD-10-CM | POA: Diagnosis not present

## 2015-11-21 DIAGNOSIS — Z96652 Presence of left artificial knee joint: Secondary | ICD-10-CM | POA: Diagnosis not present

## 2015-11-21 DIAGNOSIS — M25462 Effusion, left knee: Secondary | ICD-10-CM | POA: Diagnosis not present

## 2015-11-23 DIAGNOSIS — M25462 Effusion, left knee: Secondary | ICD-10-CM | POA: Diagnosis not present

## 2015-11-23 DIAGNOSIS — Z96652 Presence of left artificial knee joint: Secondary | ICD-10-CM | POA: Diagnosis not present

## 2015-11-23 DIAGNOSIS — M62552 Muscle wasting and atrophy, not elsewhere classified, left thigh: Secondary | ICD-10-CM | POA: Diagnosis not present

## 2015-11-23 DIAGNOSIS — R2689 Other abnormalities of gait and mobility: Secondary | ICD-10-CM | POA: Diagnosis not present

## 2015-11-28 DIAGNOSIS — M62552 Muscle wasting and atrophy, not elsewhere classified, left thigh: Secondary | ICD-10-CM | POA: Diagnosis not present

## 2015-11-28 DIAGNOSIS — R311 Benign essential microscopic hematuria: Secondary | ICD-10-CM | POA: Diagnosis not present

## 2015-11-28 DIAGNOSIS — Z96652 Presence of left artificial knee joint: Secondary | ICD-10-CM | POA: Diagnosis not present

## 2015-11-28 DIAGNOSIS — R2689 Other abnormalities of gait and mobility: Secondary | ICD-10-CM | POA: Diagnosis not present

## 2015-11-28 DIAGNOSIS — N5203 Combined arterial insufficiency and corporo-venous occlusive erectile dysfunction: Secondary | ICD-10-CM | POA: Diagnosis not present

## 2015-11-28 DIAGNOSIS — C61 Malignant neoplasm of prostate: Secondary | ICD-10-CM | POA: Diagnosis not present

## 2015-11-28 DIAGNOSIS — R300 Dysuria: Secondary | ICD-10-CM | POA: Diagnosis not present

## 2015-11-28 DIAGNOSIS — M25462 Effusion, left knee: Secondary | ICD-10-CM | POA: Diagnosis not present

## 2015-11-28 DIAGNOSIS — N3021 Other chronic cystitis with hematuria: Secondary | ICD-10-CM | POA: Diagnosis not present

## 2015-12-01 DIAGNOSIS — R2689 Other abnormalities of gait and mobility: Secondary | ICD-10-CM | POA: Diagnosis not present

## 2015-12-01 DIAGNOSIS — Z96652 Presence of left artificial knee joint: Secondary | ICD-10-CM | POA: Diagnosis not present

## 2015-12-01 DIAGNOSIS — M62552 Muscle wasting and atrophy, not elsewhere classified, left thigh: Secondary | ICD-10-CM | POA: Diagnosis not present

## 2015-12-01 DIAGNOSIS — M25462 Effusion, left knee: Secondary | ICD-10-CM | POA: Diagnosis not present

## 2015-12-05 DIAGNOSIS — R2689 Other abnormalities of gait and mobility: Secondary | ICD-10-CM | POA: Diagnosis not present

## 2015-12-05 DIAGNOSIS — M25462 Effusion, left knee: Secondary | ICD-10-CM | POA: Diagnosis not present

## 2015-12-05 DIAGNOSIS — M62552 Muscle wasting and atrophy, not elsewhere classified, left thigh: Secondary | ICD-10-CM | POA: Diagnosis not present

## 2015-12-05 DIAGNOSIS — Z96652 Presence of left artificial knee joint: Secondary | ICD-10-CM | POA: Diagnosis not present

## 2015-12-08 DIAGNOSIS — R2689 Other abnormalities of gait and mobility: Secondary | ICD-10-CM | POA: Diagnosis not present

## 2015-12-08 DIAGNOSIS — M62552 Muscle wasting and atrophy, not elsewhere classified, left thigh: Secondary | ICD-10-CM | POA: Diagnosis not present

## 2015-12-08 DIAGNOSIS — M25462 Effusion, left knee: Secondary | ICD-10-CM | POA: Diagnosis not present

## 2015-12-08 DIAGNOSIS — Z96652 Presence of left artificial knee joint: Secondary | ICD-10-CM | POA: Diagnosis not present

## 2015-12-13 DIAGNOSIS — M25462 Effusion, left knee: Secondary | ICD-10-CM | POA: Diagnosis not present

## 2015-12-13 DIAGNOSIS — R2689 Other abnormalities of gait and mobility: Secondary | ICD-10-CM | POA: Diagnosis not present

## 2015-12-13 DIAGNOSIS — Z96652 Presence of left artificial knee joint: Secondary | ICD-10-CM | POA: Diagnosis not present

## 2015-12-13 DIAGNOSIS — M62552 Muscle wasting and atrophy, not elsewhere classified, left thigh: Secondary | ICD-10-CM | POA: Diagnosis not present

## 2015-12-15 DIAGNOSIS — R2689 Other abnormalities of gait and mobility: Secondary | ICD-10-CM | POA: Diagnosis not present

## 2015-12-15 DIAGNOSIS — Z96652 Presence of left artificial knee joint: Secondary | ICD-10-CM | POA: Diagnosis not present

## 2015-12-15 DIAGNOSIS — M25462 Effusion, left knee: Secondary | ICD-10-CM | POA: Diagnosis not present

## 2015-12-15 DIAGNOSIS — M62552 Muscle wasting and atrophy, not elsewhere classified, left thigh: Secondary | ICD-10-CM | POA: Diagnosis not present

## 2015-12-21 DIAGNOSIS — Z96652 Presence of left artificial knee joint: Secondary | ICD-10-CM | POA: Diagnosis not present

## 2015-12-21 DIAGNOSIS — M62552 Muscle wasting and atrophy, not elsewhere classified, left thigh: Secondary | ICD-10-CM | POA: Diagnosis not present

## 2015-12-21 DIAGNOSIS — M25462 Effusion, left knee: Secondary | ICD-10-CM | POA: Diagnosis not present

## 2015-12-21 DIAGNOSIS — R2689 Other abnormalities of gait and mobility: Secondary | ICD-10-CM | POA: Diagnosis not present

## 2015-12-25 DIAGNOSIS — R29898 Other symptoms and signs involving the musculoskeletal system: Secondary | ICD-10-CM | POA: Diagnosis not present

## 2015-12-25 DIAGNOSIS — Q383 Other congenital malformations of tongue: Secondary | ICD-10-CM | POA: Diagnosis not present

## 2015-12-27 DIAGNOSIS — R4781 Slurred speech: Secondary | ICD-10-CM | POA: Diagnosis not present

## 2015-12-27 DIAGNOSIS — M62552 Muscle wasting and atrophy, not elsewhere classified, left thigh: Secondary | ICD-10-CM | POA: Diagnosis not present

## 2015-12-27 DIAGNOSIS — Z96652 Presence of left artificial knee joint: Secondary | ICD-10-CM | POA: Diagnosis not present

## 2015-12-27 DIAGNOSIS — R2689 Other abnormalities of gait and mobility: Secondary | ICD-10-CM | POA: Diagnosis not present

## 2015-12-27 DIAGNOSIS — M25462 Effusion, left knee: Secondary | ICD-10-CM | POA: Diagnosis not present

## 2015-12-27 DIAGNOSIS — R29898 Other symptoms and signs involving the musculoskeletal system: Secondary | ICD-10-CM | POA: Diagnosis not present

## 2015-12-27 DIAGNOSIS — C61 Malignant neoplasm of prostate: Secondary | ICD-10-CM | POA: Diagnosis not present

## 2016-01-03 DIAGNOSIS — H25813 Combined forms of age-related cataract, bilateral: Secondary | ICD-10-CM | POA: Diagnosis not present

## 2016-01-03 DIAGNOSIS — H11153 Pinguecula, bilateral: Secondary | ICD-10-CM | POA: Diagnosis not present

## 2016-01-10 DIAGNOSIS — M62552 Muscle wasting and atrophy, not elsewhere classified, left thigh: Secondary | ICD-10-CM | POA: Diagnosis not present

## 2016-01-10 DIAGNOSIS — M25462 Effusion, left knee: Secondary | ICD-10-CM | POA: Diagnosis not present

## 2016-01-10 DIAGNOSIS — Z96652 Presence of left artificial knee joint: Secondary | ICD-10-CM | POA: Diagnosis not present

## 2016-01-10 DIAGNOSIS — R2689 Other abnormalities of gait and mobility: Secondary | ICD-10-CM | POA: Diagnosis not present

## 2016-01-17 DIAGNOSIS — R209 Unspecified disturbances of skin sensation: Secondary | ICD-10-CM | POA: Diagnosis not present

## 2016-01-17 DIAGNOSIS — R590 Localized enlarged lymph nodes: Secondary | ICD-10-CM | POA: Diagnosis not present

## 2016-01-17 DIAGNOSIS — Z96652 Presence of left artificial knee joint: Secondary | ICD-10-CM | POA: Diagnosis not present

## 2016-01-17 DIAGNOSIS — Q383 Other congenital malformations of tongue: Secondary | ICD-10-CM | POA: Diagnosis not present

## 2016-01-17 DIAGNOSIS — R202 Paresthesia of skin: Secondary | ICD-10-CM | POA: Diagnosis not present

## 2016-01-22 DIAGNOSIS — Z96652 Presence of left artificial knee joint: Secondary | ICD-10-CM | POA: Diagnosis not present

## 2016-01-23 DIAGNOSIS — C61 Malignant neoplasm of prostate: Secondary | ICD-10-CM | POA: Diagnosis not present

## 2016-01-23 DIAGNOSIS — R590 Localized enlarged lymph nodes: Secondary | ICD-10-CM | POA: Diagnosis not present

## 2016-02-01 DIAGNOSIS — Z6828 Body mass index (BMI) 28.0-28.9, adult: Secondary | ICD-10-CM | POA: Diagnosis not present

## 2016-02-01 DIAGNOSIS — E785 Hyperlipidemia, unspecified: Secondary | ICD-10-CM | POA: Diagnosis not present

## 2016-02-01 DIAGNOSIS — R1311 Dysphagia, oral phase: Secondary | ICD-10-CM | POA: Diagnosis not present

## 2016-02-01 DIAGNOSIS — R200 Anesthesia of skin: Secondary | ICD-10-CM | POA: Diagnosis not present

## 2016-02-07 DIAGNOSIS — R1310 Dysphagia, unspecified: Secondary | ICD-10-CM | POA: Diagnosis not present

## 2016-02-07 DIAGNOSIS — R1311 Dysphagia, oral phase: Secondary | ICD-10-CM | POA: Diagnosis not present

## 2016-02-07 DIAGNOSIS — I6789 Other cerebrovascular disease: Secondary | ICD-10-CM | POA: Diagnosis not present

## 2016-02-07 DIAGNOSIS — R200 Anesthesia of skin: Secondary | ICD-10-CM | POA: Diagnosis not present

## 2016-02-07 DIAGNOSIS — R202 Paresthesia of skin: Secondary | ICD-10-CM | POA: Diagnosis not present

## 2016-02-21 DIAGNOSIS — R200 Anesthesia of skin: Secondary | ICD-10-CM | POA: Diagnosis not present

## 2016-02-21 DIAGNOSIS — R1311 Dysphagia, oral phase: Secondary | ICD-10-CM | POA: Diagnosis not present

## 2016-02-21 DIAGNOSIS — R4781 Slurred speech: Secondary | ICD-10-CM | POA: Diagnosis not present

## 2016-02-21 DIAGNOSIS — Z6826 Body mass index (BMI) 26.0-26.9, adult: Secondary | ICD-10-CM | POA: Diagnosis not present

## 2016-02-28 DIAGNOSIS — R4781 Slurred speech: Secondary | ICD-10-CM | POA: Diagnosis not present

## 2016-03-06 DIAGNOSIS — R1311 Dysphagia, oral phase: Secondary | ICD-10-CM | POA: Diagnosis not present

## 2016-03-06 DIAGNOSIS — G7000 Myasthenia gravis without (acute) exacerbation: Secondary | ICD-10-CM | POA: Insufficient documentation

## 2016-03-06 DIAGNOSIS — R4781 Slurred speech: Secondary | ICD-10-CM | POA: Diagnosis not present

## 2016-03-06 HISTORY — DX: Myasthenia gravis without (acute) exacerbation: G70.00

## 2016-03-19 DIAGNOSIS — N5203 Combined arterial insufficiency and corporo-venous occlusive erectile dysfunction: Secondary | ICD-10-CM | POA: Diagnosis not present

## 2016-03-19 DIAGNOSIS — C61 Malignant neoplasm of prostate: Secondary | ICD-10-CM | POA: Diagnosis not present

## 2016-03-19 DIAGNOSIS — N3090 Cystitis, unspecified without hematuria: Secondary | ICD-10-CM | POA: Diagnosis not present

## 2016-03-19 DIAGNOSIS — R972 Elevated prostate specific antigen [PSA]: Secondary | ICD-10-CM | POA: Diagnosis not present

## 2016-03-19 DIAGNOSIS — N318 Other neuromuscular dysfunction of bladder: Secondary | ICD-10-CM | POA: Diagnosis not present

## 2016-03-19 DIAGNOSIS — N3020 Other chronic cystitis without hematuria: Secondary | ICD-10-CM | POA: Diagnosis not present

## 2016-04-02 DIAGNOSIS — N3020 Other chronic cystitis without hematuria: Secondary | ICD-10-CM | POA: Diagnosis not present

## 2016-04-02 DIAGNOSIS — N5203 Combined arterial insufficiency and corporo-venous occlusive erectile dysfunction: Secondary | ICD-10-CM | POA: Diagnosis not present

## 2016-04-02 DIAGNOSIS — C61 Malignant neoplasm of prostate: Secondary | ICD-10-CM | POA: Diagnosis not present

## 2016-04-02 DIAGNOSIS — R972 Elevated prostate specific antigen [PSA]: Secondary | ICD-10-CM | POA: Diagnosis not present

## 2016-04-02 DIAGNOSIS — N318 Other neuromuscular dysfunction of bladder: Secondary | ICD-10-CM | POA: Diagnosis not present

## 2016-04-04 DIAGNOSIS — Z471 Aftercare following joint replacement surgery: Secondary | ICD-10-CM | POA: Diagnosis not present

## 2016-04-04 DIAGNOSIS — M19011 Primary osteoarthritis, right shoulder: Secondary | ICD-10-CM | POA: Diagnosis not present

## 2016-04-04 DIAGNOSIS — Z96611 Presence of right artificial shoulder joint: Secondary | ICD-10-CM | POA: Diagnosis not present

## 2016-04-24 DIAGNOSIS — Z6826 Body mass index (BMI) 26.0-26.9, adult: Secondary | ICD-10-CM | POA: Diagnosis not present

## 2016-04-24 DIAGNOSIS — G7000 Myasthenia gravis without (acute) exacerbation: Secondary | ICD-10-CM | POA: Diagnosis not present

## 2016-05-28 DIAGNOSIS — Z8546 Personal history of malignant neoplasm of prostate: Secondary | ICD-10-CM | POA: Diagnosis not present

## 2016-05-28 DIAGNOSIS — C61 Malignant neoplasm of prostate: Secondary | ICD-10-CM | POA: Diagnosis not present

## 2016-05-28 DIAGNOSIS — Z7982 Long term (current) use of aspirin: Secondary | ICD-10-CM | POA: Diagnosis not present

## 2016-05-28 DIAGNOSIS — R4781 Slurred speech: Secondary | ICD-10-CM | POA: Diagnosis not present

## 2016-05-28 DIAGNOSIS — G7000 Myasthenia gravis without (acute) exacerbation: Secondary | ICD-10-CM | POA: Diagnosis not present

## 2016-05-28 DIAGNOSIS — N3020 Other chronic cystitis without hematuria: Secondary | ICD-10-CM | POA: Diagnosis not present

## 2016-05-28 DIAGNOSIS — R972 Elevated prostate specific antigen [PSA]: Secondary | ICD-10-CM | POA: Diagnosis not present

## 2016-05-28 DIAGNOSIS — N5203 Combined arterial insufficiency and corporo-venous occlusive erectile dysfunction: Secondary | ICD-10-CM | POA: Diagnosis not present

## 2016-06-10 DIAGNOSIS — R918 Other nonspecific abnormal finding of lung field: Secondary | ICD-10-CM | POA: Diagnosis not present

## 2016-06-10 DIAGNOSIS — G7000 Myasthenia gravis without (acute) exacerbation: Secondary | ICD-10-CM | POA: Diagnosis not present

## 2016-07-10 DIAGNOSIS — Z79899 Other long term (current) drug therapy: Secondary | ICD-10-CM | POA: Diagnosis not present

## 2016-07-10 DIAGNOSIS — G7000 Myasthenia gravis without (acute) exacerbation: Secondary | ICD-10-CM | POA: Diagnosis not present

## 2016-07-24 DIAGNOSIS — Z6826 Body mass index (BMI) 26.0-26.9, adult: Secondary | ICD-10-CM | POA: Diagnosis not present

## 2016-07-24 DIAGNOSIS — G7000 Myasthenia gravis without (acute) exacerbation: Secondary | ICD-10-CM | POA: Diagnosis not present

## 2016-07-31 DIAGNOSIS — E7800 Pure hypercholesterolemia, unspecified: Secondary | ICD-10-CM | POA: Diagnosis not present

## 2016-07-31 DIAGNOSIS — E8881 Metabolic syndrome: Secondary | ICD-10-CM | POA: Diagnosis not present

## 2016-07-31 DIAGNOSIS — R0982 Postnasal drip: Secondary | ICD-10-CM | POA: Diagnosis not present

## 2016-07-31 DIAGNOSIS — J309 Allergic rhinitis, unspecified: Secondary | ICD-10-CM | POA: Diagnosis not present

## 2016-07-31 DIAGNOSIS — M19211 Secondary osteoarthritis, right shoulder: Secondary | ICD-10-CM | POA: Diagnosis not present

## 2016-09-06 DIAGNOSIS — E860 Dehydration: Secondary | ICD-10-CM | POA: Diagnosis not present

## 2016-09-06 DIAGNOSIS — R55 Syncope and collapse: Secondary | ICD-10-CM | POA: Diagnosis not present

## 2016-09-06 DIAGNOSIS — R531 Weakness: Secondary | ICD-10-CM | POA: Diagnosis not present

## 2016-09-06 DIAGNOSIS — R42 Dizziness and giddiness: Secondary | ICD-10-CM | POA: Diagnosis not present

## 2016-09-06 DIAGNOSIS — R404 Transient alteration of awareness: Secondary | ICD-10-CM | POA: Diagnosis not present

## 2016-09-06 DIAGNOSIS — G7000 Myasthenia gravis without (acute) exacerbation: Secondary | ICD-10-CM | POA: Diagnosis not present

## 2016-09-06 DIAGNOSIS — N390 Urinary tract infection, site not specified: Secondary | ICD-10-CM | POA: Diagnosis not present

## 2016-09-06 DIAGNOSIS — Z79899 Other long term (current) drug therapy: Secondary | ICD-10-CM | POA: Diagnosis not present

## 2016-09-18 DIAGNOSIS — C61 Malignant neoplasm of prostate: Secondary | ICD-10-CM | POA: Diagnosis not present

## 2016-09-18 DIAGNOSIS — N318 Other neuromuscular dysfunction of bladder: Secondary | ICD-10-CM | POA: Diagnosis not present

## 2016-09-18 DIAGNOSIS — N3020 Other chronic cystitis without hematuria: Secondary | ICD-10-CM | POA: Diagnosis not present

## 2016-10-10 DIAGNOSIS — Z79899 Other long term (current) drug therapy: Secondary | ICD-10-CM | POA: Diagnosis not present

## 2016-10-10 DIAGNOSIS — G7000 Myasthenia gravis without (acute) exacerbation: Secondary | ICD-10-CM | POA: Diagnosis not present

## 2016-10-15 DIAGNOSIS — Z23 Encounter for immunization: Secondary | ICD-10-CM | POA: Diagnosis not present

## 2016-11-06 DIAGNOSIS — Z79899 Other long term (current) drug therapy: Secondary | ICD-10-CM | POA: Diagnosis not present

## 2016-11-06 DIAGNOSIS — G7000 Myasthenia gravis without (acute) exacerbation: Secondary | ICD-10-CM | POA: Diagnosis not present

## 2016-11-11 DIAGNOSIS — N3090 Cystitis, unspecified without hematuria: Secondary | ICD-10-CM | POA: Diagnosis not present

## 2016-11-11 DIAGNOSIS — N3000 Acute cystitis without hematuria: Secondary | ICD-10-CM | POA: Diagnosis not present

## 2016-11-27 DIAGNOSIS — N3000 Acute cystitis without hematuria: Secondary | ICD-10-CM | POA: Diagnosis not present

## 2016-11-27 DIAGNOSIS — N210 Calculus in bladder: Secondary | ICD-10-CM | POA: Diagnosis not present

## 2016-11-27 DIAGNOSIS — N318 Other neuromuscular dysfunction of bladder: Secondary | ICD-10-CM | POA: Diagnosis not present

## 2016-11-27 DIAGNOSIS — C61 Malignant neoplasm of prostate: Secondary | ICD-10-CM | POA: Diagnosis not present

## 2016-11-28 DIAGNOSIS — N318 Other neuromuscular dysfunction of bladder: Secondary | ICD-10-CM | POA: Diagnosis not present

## 2016-11-28 DIAGNOSIS — N3000 Acute cystitis without hematuria: Secondary | ICD-10-CM | POA: Diagnosis not present

## 2016-11-28 DIAGNOSIS — G7000 Myasthenia gravis without (acute) exacerbation: Secondary | ICD-10-CM | POA: Diagnosis not present

## 2016-11-28 DIAGNOSIS — N210 Calculus in bladder: Secondary | ICD-10-CM | POA: Diagnosis not present

## 2016-11-28 DIAGNOSIS — Z79899 Other long term (current) drug therapy: Secondary | ICD-10-CM | POA: Diagnosis not present

## 2016-11-28 DIAGNOSIS — N209 Urinary calculus, unspecified: Secondary | ICD-10-CM | POA: Diagnosis not present

## 2016-12-09 DIAGNOSIS — Z79899 Other long term (current) drug therapy: Secondary | ICD-10-CM | POA: Diagnosis not present

## 2016-12-10 DIAGNOSIS — N3020 Other chronic cystitis without hematuria: Secondary | ICD-10-CM | POA: Diagnosis not present

## 2016-12-10 DIAGNOSIS — N210 Calculus in bladder: Secondary | ICD-10-CM | POA: Diagnosis not present

## 2016-12-13 DIAGNOSIS — K5730 Diverticulosis of large intestine without perforation or abscess without bleeding: Secondary | ICD-10-CM | POA: Diagnosis not present

## 2016-12-13 DIAGNOSIS — K635 Polyp of colon: Secondary | ICD-10-CM | POA: Diagnosis not present

## 2016-12-13 DIAGNOSIS — Z800 Family history of malignant neoplasm of digestive organs: Secondary | ICD-10-CM | POA: Diagnosis not present

## 2016-12-13 DIAGNOSIS — Z1211 Encounter for screening for malignant neoplasm of colon: Secondary | ICD-10-CM | POA: Diagnosis not present

## 2016-12-13 DIAGNOSIS — D125 Benign neoplasm of sigmoid colon: Secondary | ICD-10-CM | POA: Diagnosis not present

## 2017-01-08 DIAGNOSIS — Z79899 Other long term (current) drug therapy: Secondary | ICD-10-CM | POA: Diagnosis not present

## 2017-01-15 DIAGNOSIS — H25813 Combined forms of age-related cataract, bilateral: Secondary | ICD-10-CM | POA: Diagnosis not present

## 2017-01-29 DIAGNOSIS — Z6825 Body mass index (BMI) 25.0-25.9, adult: Secondary | ICD-10-CM | POA: Diagnosis not present

## 2017-01-29 DIAGNOSIS — G7000 Myasthenia gravis without (acute) exacerbation: Secondary | ICD-10-CM | POA: Diagnosis not present

## 2017-01-29 DIAGNOSIS — R4781 Slurred speech: Secondary | ICD-10-CM | POA: Diagnosis not present

## 2017-01-29 DIAGNOSIS — R1311 Dysphagia, oral phase: Secondary | ICD-10-CM | POA: Diagnosis not present

## 2017-02-03 DIAGNOSIS — Z79899 Other long term (current) drug therapy: Secondary | ICD-10-CM | POA: Diagnosis not present

## 2017-02-06 DIAGNOSIS — G7000 Myasthenia gravis without (acute) exacerbation: Secondary | ICD-10-CM | POA: Diagnosis not present

## 2017-02-06 DIAGNOSIS — Z79899 Other long term (current) drug therapy: Secondary | ICD-10-CM | POA: Diagnosis not present

## 2017-02-12 DIAGNOSIS — J309 Allergic rhinitis, unspecified: Secondary | ICD-10-CM | POA: Diagnosis not present

## 2017-02-12 DIAGNOSIS — G7000 Myasthenia gravis without (acute) exacerbation: Secondary | ICD-10-CM | POA: Diagnosis not present

## 2017-02-12 DIAGNOSIS — R0982 Postnasal drip: Secondary | ICD-10-CM | POA: Diagnosis not present

## 2017-02-12 DIAGNOSIS — E8881 Metabolic syndrome: Secondary | ICD-10-CM | POA: Diagnosis not present

## 2017-05-05 DIAGNOSIS — Z79899 Other long term (current) drug therapy: Secondary | ICD-10-CM | POA: Diagnosis not present

## 2017-05-12 DIAGNOSIS — Z79899 Other long term (current) drug therapy: Secondary | ICD-10-CM | POA: Diagnosis not present

## 2017-05-12 DIAGNOSIS — G7000 Myasthenia gravis without (acute) exacerbation: Secondary | ICD-10-CM | POA: Diagnosis not present

## 2017-05-28 DIAGNOSIS — N210 Calculus in bladder: Secondary | ICD-10-CM | POA: Diagnosis not present

## 2017-05-28 DIAGNOSIS — C61 Malignant neoplasm of prostate: Secondary | ICD-10-CM | POA: Diagnosis not present

## 2017-05-28 DIAGNOSIS — N3020 Other chronic cystitis without hematuria: Secondary | ICD-10-CM | POA: Diagnosis not present

## 2017-05-28 DIAGNOSIS — N538 Other male sexual dysfunction: Secondary | ICD-10-CM | POA: Diagnosis not present

## 2017-08-20 DIAGNOSIS — M19211 Secondary osteoarthritis, right shoulder: Secondary | ICD-10-CM | POA: Diagnosis not present

## 2017-08-20 DIAGNOSIS — Z1159 Encounter for screening for other viral diseases: Secondary | ICD-10-CM | POA: Diagnosis not present

## 2017-08-20 DIAGNOSIS — E8881 Metabolic syndrome: Secondary | ICD-10-CM | POA: Diagnosis not present

## 2017-08-20 DIAGNOSIS — Z79899 Other long term (current) drug therapy: Secondary | ICD-10-CM | POA: Diagnosis not present

## 2017-08-20 DIAGNOSIS — E785 Hyperlipidemia, unspecified: Secondary | ICD-10-CM | POA: Diagnosis not present

## 2017-08-20 DIAGNOSIS — M170 Bilateral primary osteoarthritis of knee: Secondary | ICD-10-CM | POA: Diagnosis not present

## 2017-08-20 DIAGNOSIS — J309 Allergic rhinitis, unspecified: Secondary | ICD-10-CM | POA: Diagnosis not present

## 2017-08-20 DIAGNOSIS — Z0000 Encounter for general adult medical examination without abnormal findings: Secondary | ICD-10-CM | POA: Diagnosis not present

## 2017-08-20 DIAGNOSIS — Z23 Encounter for immunization: Secondary | ICD-10-CM | POA: Diagnosis not present

## 2017-09-04 DIAGNOSIS — G7000 Myasthenia gravis without (acute) exacerbation: Secondary | ICD-10-CM | POA: Diagnosis not present

## 2017-09-04 DIAGNOSIS — Z79899 Other long term (current) drug therapy: Secondary | ICD-10-CM | POA: Diagnosis not present

## 2017-11-03 DIAGNOSIS — Z79899 Other long term (current) drug therapy: Secondary | ICD-10-CM | POA: Diagnosis not present

## 2018-02-04 DIAGNOSIS — M25512 Pain in left shoulder: Secondary | ICD-10-CM | POA: Diagnosis not present

## 2018-02-04 DIAGNOSIS — Z79899 Other long term (current) drug therapy: Secondary | ICD-10-CM | POA: Diagnosis not present

## 2018-02-04 DIAGNOSIS — G7000 Myasthenia gravis without (acute) exacerbation: Secondary | ICD-10-CM | POA: Diagnosis not present

## 2018-03-10 DIAGNOSIS — Z96611 Presence of right artificial shoulder joint: Secondary | ICD-10-CM | POA: Diagnosis not present

## 2018-03-10 DIAGNOSIS — M25511 Pain in right shoulder: Secondary | ICD-10-CM | POA: Diagnosis not present

## 2018-03-10 DIAGNOSIS — M25512 Pain in left shoulder: Secondary | ICD-10-CM | POA: Diagnosis not present

## 2018-04-23 DIAGNOSIS — E8881 Metabolic syndrome: Secondary | ICD-10-CM | POA: Diagnosis not present

## 2018-04-23 DIAGNOSIS — E785 Hyperlipidemia, unspecified: Secondary | ICD-10-CM | POA: Diagnosis not present

## 2018-04-23 DIAGNOSIS — Z6825 Body mass index (BMI) 25.0-25.9, adult: Secondary | ICD-10-CM | POA: Diagnosis not present

## 2018-05-26 DIAGNOSIS — C61 Malignant neoplasm of prostate: Secondary | ICD-10-CM | POA: Diagnosis not present

## 2018-05-26 DIAGNOSIS — N3000 Acute cystitis without hematuria: Secondary | ICD-10-CM | POA: Diagnosis not present

## 2018-05-26 DIAGNOSIS — N538 Other male sexual dysfunction: Secondary | ICD-10-CM | POA: Diagnosis not present

## 2018-07-07 DIAGNOSIS — H25812 Combined forms of age-related cataract, left eye: Secondary | ICD-10-CM | POA: Diagnosis not present

## 2018-07-07 DIAGNOSIS — H2511 Age-related nuclear cataract, right eye: Secondary | ICD-10-CM | POA: Diagnosis not present

## 2018-07-31 ENCOUNTER — Encounter (HOSPITAL_COMMUNITY): Payer: Self-pay | Admitting: *Deleted

## 2018-07-31 ENCOUNTER — Emergency Department (HOSPITAL_COMMUNITY): Payer: Medicare Other

## 2018-07-31 ENCOUNTER — Inpatient Hospital Stay (HOSPITAL_COMMUNITY)
Admission: EM | Admit: 2018-07-31 | Discharge: 2018-08-01 | DRG: 310 | Disposition: A | Discharge status: Home / Self Care | Payer: Medicare Other | Attending: Internal Medicine | Admitting: Internal Medicine

## 2018-07-31 ENCOUNTER — Other Ambulatory Visit: Payer: Self-pay

## 2018-07-31 DIAGNOSIS — I10 Essential (primary) hypertension: Secondary | ICD-10-CM | POA: Diagnosis not present

## 2018-07-31 DIAGNOSIS — Z8546 Personal history of malignant neoplasm of prostate: Secondary | ICD-10-CM

## 2018-07-31 DIAGNOSIS — H2512 Age-related nuclear cataract, left eye: Secondary | ICD-10-CM | POA: Diagnosis not present

## 2018-07-31 DIAGNOSIS — G7000 Myasthenia gravis without (acute) exacerbation: Secondary | ICD-10-CM | POA: Diagnosis not present

## 2018-07-31 DIAGNOSIS — I442 Atrioventricular block, complete: Secondary | ICD-10-CM | POA: Diagnosis not present

## 2018-07-31 DIAGNOSIS — Z791 Long term (current) use of non-steroidal anti-inflammatories (NSAID): Secondary | ICD-10-CM

## 2018-07-31 DIAGNOSIS — Z79899 Other long term (current) drug therapy: Secondary | ICD-10-CM

## 2018-07-31 DIAGNOSIS — R001 Bradycardia, unspecified: Secondary | ICD-10-CM | POA: Diagnosis not present

## 2018-07-31 DIAGNOSIS — Z79891 Long term (current) use of opiate analgesic: Secondary | ICD-10-CM | POA: Diagnosis not present

## 2018-07-31 DIAGNOSIS — Z9849 Cataract extraction status, unspecified eye: Secondary | ICD-10-CM | POA: Diagnosis not present

## 2018-07-31 DIAGNOSIS — I499 Cardiac arrhythmia, unspecified: Secondary | ICD-10-CM | POA: Diagnosis not present

## 2018-07-31 DIAGNOSIS — Z8669 Personal history of other diseases of the nervous system and sense organs: Secondary | ICD-10-CM

## 2018-07-31 DIAGNOSIS — Z9221 Personal history of antineoplastic chemotherapy: Secondary | ICD-10-CM | POA: Diagnosis not present

## 2018-07-31 DIAGNOSIS — Z7982 Long term (current) use of aspirin: Secondary | ICD-10-CM | POA: Diagnosis not present

## 2018-07-31 DIAGNOSIS — K219 Gastro-esophageal reflux disease without esophagitis: Secondary | ICD-10-CM | POA: Diagnosis not present

## 2018-07-31 DIAGNOSIS — H25812 Combined forms of age-related cataract, left eye: Secondary | ICD-10-CM | POA: Diagnosis not present

## 2018-07-31 DIAGNOSIS — Z01818 Encounter for other preprocedural examination: Secondary | ICD-10-CM | POA: Diagnosis not present

## 2018-07-31 DIAGNOSIS — Z888 Allergy status to other drugs, medicaments and biological substances status: Secondary | ICD-10-CM

## 2018-07-31 DIAGNOSIS — Z96611 Presence of right artificial shoulder joint: Secondary | ICD-10-CM | POA: Diagnosis present

## 2018-07-31 DIAGNOSIS — Z881 Allergy status to other antibiotic agents status: Secondary | ICD-10-CM | POA: Diagnosis not present

## 2018-07-31 DIAGNOSIS — Z820 Family history of epilepsy and other diseases of the nervous system: Secondary | ICD-10-CM | POA: Diagnosis not present

## 2018-07-31 DIAGNOSIS — M1990 Unspecified osteoarthritis, unspecified site: Secondary | ICD-10-CM | POA: Diagnosis not present

## 2018-07-31 HISTORY — DX: Conduction disorder, unspecified: I45.9

## 2018-07-31 HISTORY — DX: Atrioventricular block, complete: I44.2

## 2018-07-31 HISTORY — DX: Myasthenia gravis without (acute) exacerbation: G70.00

## 2018-07-31 LAB — CBC
HEMATOCRIT: 43 % (ref 39.0–52.0)
HEMOGLOBIN: 14.1 g/dL (ref 13.0–17.0)
MCH: 31.1 pg (ref 26.0–34.0)
MCHC: 32.8 g/dL (ref 30.0–36.0)
MCV: 94.7 fL (ref 78.0–100.0)
Platelets: 147 10*3/uL — ABNORMAL LOW (ref 150–400)
RBC: 4.54 MIL/uL (ref 4.22–5.81)
RDW: 12.4 % (ref 11.5–15.5)
WBC: 7.2 10*3/uL (ref 4.0–10.5)

## 2018-07-31 LAB — COMPREHENSIVE METABOLIC PANEL
ALK PHOS: 87 U/L (ref 38–126)
ALT: 16 U/L (ref 0–44)
AST: 25 U/L (ref 15–41)
Albumin: 3.9 g/dL (ref 3.5–5.0)
Anion gap: 8 (ref 5–15)
BILIRUBIN TOTAL: 1.4 mg/dL — AB (ref 0.3–1.2)
BUN: 12 mg/dL (ref 8–23)
CHLORIDE: 107 mmol/L (ref 98–111)
CO2: 24 mmol/L (ref 22–32)
Calcium: 9 mg/dL (ref 8.9–10.3)
Creatinine, Ser: 0.92 mg/dL (ref 0.61–1.24)
GLUCOSE: 91 mg/dL (ref 70–99)
Potassium: 4 mmol/L (ref 3.5–5.1)
Sodium: 139 mmol/L (ref 135–145)
Total Protein: 6.2 g/dL — ABNORMAL LOW (ref 6.5–8.1)

## 2018-07-31 LAB — I-STAT CHEM 8, ED
BUN: 13 mg/dL (ref 8–23)
CALCIUM ION: 1.14 mmol/L — AB (ref 1.15–1.40)
CHLORIDE: 106 mmol/L (ref 98–111)
CREATININE: 0.9 mg/dL (ref 0.61–1.24)
Glucose, Bld: 85 mg/dL (ref 70–99)
HCT: 42 % (ref 39.0–52.0)
Hemoglobin: 14.3 g/dL (ref 13.0–17.0)
Potassium: 3.9 mmol/L (ref 3.5–5.1)
SODIUM: 139 mmol/L (ref 135–145)
TCO2: 23 mmol/L (ref 22–32)

## 2018-07-31 LAB — MRSA PCR SCREENING: MRSA BY PCR: NEGATIVE

## 2018-07-31 LAB — PHOSPHORUS: Phosphorus: 2.9 mg/dL (ref 2.5–4.6)

## 2018-07-31 LAB — MAGNESIUM: MAGNESIUM: 2.2 mg/dL (ref 1.7–2.4)

## 2018-07-31 LAB — I-STAT TROPONIN, ED: Troponin i, poc: 0 ng/mL (ref 0.00–0.08)

## 2018-07-31 MED ORDER — SIMVASTATIN 20 MG PO TABS
20.0000 mg | ORAL_TABLET | Freq: Every day | ORAL | Status: DC
  Administered 2018-08-01: 20 mg via ORAL
  Filled 2018-07-31: qty 1

## 2018-07-31 MED ORDER — ADULT MULTIVITAMIN W/MINERALS CH
1.0000 | ORAL_TABLET | Freq: Every day | ORAL | Status: DC
  Administered 2018-08-01: 1 via ORAL
  Filled 2018-07-31: qty 1

## 2018-07-31 MED ORDER — MYCOPHENOLATE MOFETIL 250 MG PO CAPS
1000.0000 mg | ORAL_CAPSULE | Freq: Two times a day (BID) | ORAL | Status: DC
  Administered 2018-07-31 – 2018-08-01 (×2): 1000 mg via ORAL
  Filled 2018-07-31 (×3): qty 4

## 2018-07-31 MED ORDER — ACETAMINOPHEN 325 MG PO TABS: 650.0000 mg | ORAL_TABLET | ORAL | Status: DC | PRN

## 2018-07-31 MED ORDER — CALCIUM CARBONATE 1250 (500 CA) MG PO TABS
1.0000 | ORAL_TABLET | Freq: Two times a day (BID) | ORAL | Status: DC
  Administered 2018-08-01: 500 mg via ORAL
  Filled 2018-07-31: qty 1

## 2018-07-31 MED ORDER — HYDRALAZINE HCL 20 MG/ML IJ SOLN: 10.0000 mg | Freq: Once | INTRAMUSCULAR | Status: DC

## 2018-07-31 MED ORDER — MELOXICAM 7.5 MG PO TABS
15.0000 mg | ORAL_TABLET | Freq: Every day | ORAL | Status: DC
  Administered 2018-07-31 – 2018-08-01 (×2): 15 mg via ORAL
  Filled 2018-07-31 (×2): qty 2

## 2018-07-31 MED ORDER — PYRIDOSTIGMINE BROMIDE 60 MG PO TABS
60.0000 mg | ORAL_TABLET | Freq: Three times a day (TID) | ORAL | Status: DC
  Filled 2018-07-31 (×3): qty 1

## 2018-07-31 MED ORDER — CALCIUM CARBONATE 600 MG PO TABS: 600.0000 mg | ORAL_TABLET | Freq: Two times a day (BID) | ORAL | Status: DC

## 2018-07-31 MED ORDER — HYDRALAZINE HCL 20 MG/ML IJ SOLN
5.0000 mg | Freq: Four times a day (QID) | INTRAMUSCULAR | Status: DC | PRN
  Administered 2018-07-31: 5 mg via INTRAVENOUS
  Filled 2018-07-31: qty 1

## 2018-07-31 MED ORDER — MENS MULTI VITAMIN & MINERAL PO TABS: 1.0000 | ORAL_TABLET | Freq: Every day | ORAL | Status: DC

## 2018-07-31 NOTE — ED Provider Notes (Signed)
[facility] EMERGENCY DEPARTMENT Provider Note   CSN: [id number] Arrival date & time: 07/31/18  1404     History   Chief Complaint Chief Complaint  Patient presents with  . Abnormal ECG    HPI Kyle Bowen is a 72 y.o. male.  HPI Patient denies any prior cardiac history.  He does have a history of myasthenia gravis.  He went for an elective cataract repair today.  During anesthesia, he was noted to have complete heart block.  Patient denies he has any symptoms.  He does not feel lightheaded or dizzy.  He has not had syncope.  No chest pain no shortness of breath.  Patient's wife reports that after anesthesia in the past he does deal with lightheadedness.  However, he is never had any confirmed dysrhythmia or coronary artery disease.  Patient is not on any antihypertensive medications. Past Medical History:  Diagnosis Date  . Arthritis   . Cancer ([place]) 91   prostate   . Complication of anesthesia    occ bp drops after surgery whrn getting up   . GERD (gastroesophageal reflux disease)    occ  . Myasthenia [facility])     Patient Active Problem List   Diagnosis Date Noted  . S/P shoulder replacement 10/14/2014    Past Surgical History:  Procedure Laterality Date  . CATARACT EXTRACTION    . HERNIA REPAIR Left 05   inguinal  . PENILE PROSTHESIS IMPLANT  93   00.08 replaced  . PROSTATE SURGERY  91   ca  . TONSILLECTOMY    . TOTAL SHOULDER ARTHROPLASTY Right 10/14/2014   Procedure: RIGHT TOTAL SHOULDER ARTHROPLASTY;  Surgeon: Augustin Schooling, MD;  Location: [place];  Service: Orthopedics;  Laterality: Right;        Home Medications    Prior to Admission medications   Medication Sig Start Date End Date Taking? Authorizing Provider  aspirin 81 MG tablet Take 81 mg by mouth daily.    [provider]  calcium carbonate (OS-CAL) 600 MG TABS tablet Take 600 mg by mouth 2 (two) times daily with a meal.    [provider]  meloxicam  (MOBIC) 15 MG tablet Take 15 mg by mouth daily.    [provider]  methocarbamol (ROBAXIN) 500 MG tablet Take 1 tablet (500 mg total) by mouth 3 (three) times daily as needed. 10/14/14   Netta Cedars, MD  Multiple Vitamins-Minerals (MENS MULTI VITAMIN & MINERAL) TABS Take 1 tablet by mouth daily.    [provider]  Omega-3 Fatty Acids (FISH OIL) 300 MG CAPS Take 1 capsule by mouth daily.    [provider]  oxyCODONE-acetaminophen (ROXICET) 5-325 MG per tablet Take 1-2 tablets by mouth every 4 (four) hours as needed for severe pain. 10/14/14   Netta Cedars, MD  simvastatin (ZOCOR) 40 MG tablet Take 40 mg by mouth daily.    [provider]    Family History History reviewed. No pertinent family history.  Social History Social History   Tobacco Use  . Smoking status: Never Smoker  . Smokeless tobacco: Never Used  Substance Use Topics  . Alcohol use: Yes    Comment: occ  . Drug use: No     Allergies   Patient has no allergy information on record.   Review of Systems Review of Systems 10 Systems reviewed and are negative for acute change except as noted in the HPI.   Physical Exam Updated Vital Signs BP Marland Kitchen)  146/85 (BP Location: Right Arm)   Pulse (!) 46   Temp 97.7 F (36.5 C) (Temporal)   Resp 16   Ht 5\' 7"  (1.702 m)   Wt 72.6 kg   SpO2 100%   BMI 25.06 kg/m   Physical Exam  Constitutional: He is oriented to person, place, and time. He appears well-developed and well-nourished. No distress.  HENT:  Head: Normocephalic and atraumatic.  Mouth/Throat: Oropharynx is clear and moist.  Eyes: EOM are normal.  Patient is wearing clear eye protector over the right eye.  Neck: Neck supple.  Cardiovascular:  Heart is bradycardic regular no gross rub murmur gallop.  Palpable radial pulse.  Pulmonary/Chest: Effort normal and breath sounds normal.  Abdominal: Soft. He exhibits no distension and no mass. There is no tenderness. There is  no guarding.  Musculoskeletal: Normal range of motion. He exhibits no edema or tenderness.  Neurological: He is alert and oriented to person, place, and time. No cranial nerve deficit. He exhibits normal muscle tone. Coordination normal.  Skin: Skin is warm and dry.  Psychiatric: He has a normal mood and affect.     ED Treatments / Results  Labs (all labs ordered are listed, but only abnormal results are displayed) Labs Reviewed  I-STAT CHEM 8, ED - Abnormal; Notable for the following components:      Result Value   Calcium, Ion 1.14 (*)    All other components within normal limits  CBC  COMPREHENSIVE METABOLIC PANEL  MAGNESIUM  PHOSPHORUS  RAPID URINE DRUG SCREEN, HOSP PERFORMED  I-STAT TROPONIN, ED    EKG EKG Interpretation  Date/Time:  Friday July 31 2018 14:20:29 EDT Ventricular Rate:  46 PR Interval:    QRS Duration: 113 QT Interval:  485 QTC Calculation: 425 R Axis:   -53 Text Interpretation:  Confirmed by Charlesetta Shanks [phone]) on 07/31/2018 3:44:37 PM   Radiology Dg Chest Portable 1 View  Result Date: 07/31/2018 CLINICAL DATA:  Arrhythmia.  No chest complaints. EXAM: PORTABLE CHEST 1 VIEW COMPARISON:  Chest x-ray dated September 06, 2016. FINDINGS: The heart size and mediastinal contours are within normal limits. Both lungs are clear. The visualized skeletal structures are unremarkable. Prior right total shoulder arthroplasty. IMPRESSION: No active disease. Electronically Signed   By: Titus Dubin M.D.   On: 07/31/2018 15:03    Procedures Procedures (including critical care time)  Medications Ordered in ED Medications - No data to display   Initial Impression / Assessment and Plan / ED Course  I have reviewed the triage vital signs and the nursing notes.  Pertinent labs & imaging results that were available during my care of the patient were reviewed by me and considered in my medical decision making (see chart for details).    Cardiology consulted,  have seen in the emergency department and consulted EP as well.  Final Clinical Impressions(s) / ED Diagnoses   Final diagnoses:  Complete heart block ([place])  History of myasthenia gravis   Patient will be admitted by cardiology.  He has remained stable in the emergency department.  He is perfusing at a ventricular rate of 40.  Patient does not have any dyspnea or generalized weakness.  Blood pressure stable.  Unclear etiology of newly identified complete heart block. ED Discharge Orders    None       Charlesetta Shanks, MD 07/31/18 [phone]

## 2018-07-31 NOTE — H&P (Addendum)
Cardiology Consultation:   Patient ID: [name]; [id number]; 07-03-1946   Admit date: 07/31/2018 Date of Consult: 07/31/2018  Primary Care Provider: Cyndy Freeze, MD Primary Cardiologist: new Primary Electrophysiologist:  New, Dr. Caryl Comes   Patient Profile:   Kyle Bowen is a 72 y.o. male with a hx of myasthenia gravis, hx of prostate cancer tx with surgery, arthritis.  No other known medical history who is being seen today for the evaluation of bradycardia, advanced heart block at the request of Dr. Johnney Killian.  History of Present Illness:   Mr. Longenecker about 2 weeks ago had been in [place] with his wife caring for their grandchildren, felt well, and typically takes him about a week to "recover" from those visits.  He and his wife both though have been unusually slow to get back to "themselfves".  He denies any specific symptoms, just more tired.  He has been though golfing as usual, push mowing and caring for his lawn, describes himself as very active and no exertional incapacities.  No dizzy spell,s no near syncope or syncope.  No CP, palpitations or SOB, no DOE.  He had cataract surgery today, and told afterwards that his HR was "all over the place", and afterwards an EKG done and found to be slow and after obtaining and old EKG told him he had a block and for his wife to drive him to the ER.  He remains without any symptoms.  LABS so far K+ 3.9 BUN/Creat 13/0.90 poc Trop 0.00 H/H 14/42  Home meds: Meloxicam, simvastatin, MVit, caltrate, mycophenolate mofetil, pyridostigmine bromide  Past Medical History:  Diagnosis Date  . Arthritis   . Cancer ([place]) 91   prostate   . Complication of anesthesia    occ bp drops after surgery whrn getting up   . GERD (gastroesophageal reflux disease)    occ  . Myasthenia [facility])     Past Surgical History:  Procedure Laterality Date  . CATARACT EXTRACTION    . HERNIA REPAIR Left 05   inguinal  . PENILE PROSTHESIS IMPLANT  93    00.08 replaced  . PROSTATE SURGERY  91   ca  . TONSILLECTOMY    . TOTAL SHOULDER ARTHROPLASTY Right 10/14/2014   Procedure: RIGHT TOTAL SHOULDER ARTHROPLASTY;  Surgeon: Augustin Schooling, MD;  Location: [place];  Service: Orthopedics;  Laterality: Right;      Inpatient Medications: Scheduled Meds:  Continuous Infusions:  PRN Meds:   Allergies:   Not on File  Social History:   Social History   Socioeconomic History  . Marital status: Married    Spouse name: Not on file  . Number of children: Not on file  . Years of education: Not on file  . Highest education level: Not on file  Occupational History  . Not on file  Social Needs  . Financial resource strain: Not on file  . Food insecurity:    Worry: Not on file    Inability: Not on file  . Transportation needs:    Medical: Not on file    Non-medical: Not on file  Tobacco Use  . Smoking status: Never Smoker  . Smokeless tobacco: Never Used  Substance and Sexual Activity  . Alcohol use: Yes    Comment: occ  . Drug use: No  . Sexual activity: Not on file  Lifestyle  . Physical activity:    Days per week: Not on file    Minutes per session: Not on file  . Stress:  Not on file  Relationships  . Social connections:    Talks on phone: Not on file    Gets together: Not on file    Attends religious service: Not on file    Active member of club or organization: Not on file    Attends meetings of clubs or organizations: Not on file    Relationship status: Not on file  . Intimate partner violence:    Fear of current or ex partner: Not on file    Emotionally abused: Not on file    Physically abused: Not on file    Forced sexual activity: Not on file  Other Topics Concern  . Not on file  Social History Narrative  . Not on file    Family History:   Family History  Problem Relation Age of Onset  . Aneurysm Mother   . Alzheimer's disease Father   . Multiple sclerosis Brother      ROS:  Please see the history of  present illness.  All other ROS reviewed and negative.     Physical Exam/Data:   Vitals:   07/31/18 1419 07/31/18 1423  BP: (!) 146/85   Pulse: (!) 46   Resp: 16   Temp: 97.7 F (36.5 C)   TempSrc: Temporal   SpO2: 100%   Weight:  72.6 kg  Height:  5\' 7"  (1.702 m)   No intake or output data in the 24 hours ending 07/31/18 1605 Filed Weights   07/31/18 1423  Weight: 72.6 kg   Body mass index is 25.06 kg/m.  General:  Well nourished, well developed, in no acute distress HEENT: normal, clear plastic hard patch R eye Lymph: no adenopathy Neck: no JVD Endocrine:  No thryomegaly Vascular: No carotid bruits  Cardiac:   RRR; no murmurs, gallops or rubs Lungs:  CTA b/l, no wheezing, rhonchi or rales  Abd: soft, nontender  Ext: no edema Musculoskeletal:  No deformities Skin: warm and dry  Neuro:  No gross focal abnormalities noted Psych:  Normal affect   EKG:  The EKG was personally reviewed and demonstrates:   V rate 46bpm, some beats appear to have AV conduction, though not throughout, appears CHB, QRS 142ms Out patient EKG dated today is CHB 42bpm, QRS 167ms  07/18/15 is SR, normal intervals  Telemetry:  Telemetry was personally reviewed and demonstrates:   CHB 40's, narrow QRS   Relevant CV Studies: No historical cardiac data  Laboratory Data:  Chemistry Recent Labs  Lab 07/31/18 1516  NA 139  K 3.9  CL 106  GLUCOSE 85  BUN 13  CREATININE 0.90    No results for input(s): PROT, ALBUMIN, AST, ALT, ALKPHOS, BILITOT in the last 168 hours. Hematology Recent Labs  Lab 07/31/18 1516 07/31/18 1531  WBC  --  7.2  RBC  --  4.54  HGB 14.3 14.1  HCT 42.0 43.0  MCV  --  94.7  MCH  --  31.1  MCHC  --  32.8  RDW  --  12.4  PLT  --  147*   Cardiac EnzymesNo results for input(s): TROPONINI in the last 168 hours.  Recent Labs  Lab 07/31/18 1515  TROPIPOC 0.00    BNPNo results for input(s): BNP, PROBNP in the last 168 hours.  DDimer No results for  input(s): DDIMER in the last 168 hours.  Radiology/Studies:   Dg Chest Portable 1 View Result Date: 07/31/2018 CLINICAL DATA:  Arrhythmia.  No chest complaints. EXAM: PORTABLE CHEST 1 VIEW COMPARISON:  Chest x-ray dated September 06, 2016. FINDINGS: The heart size and mediastinal contours are within normal limits. Both lungs are clear. The visualized skeletal structures are unremarkable. Prior right total shoulder arthroplasty. IMPRESSION: No active disease. Electronically Signed   By: Titus Dubin M.D.   On: 07/31/2018 15:03    Assessment and Plan:   1. CHB     An incidental finding with his cataract surgery     No symptoms     BP looks OK  In review of his home meds Pyridostigmine appears to have has potential to cause AV block though I can't see stopping this His mycophenolate is an immunosuppressant, making his infection risk elevated.  I feel he will need pacing Dr. Caryl Comes will see He is stable at this time with a narrow QRS escape in the 40's   2. Myesthenia Gravis     Resume home meds     Dr. Caryl Comes has a call out to Dr. Duke Salvia to discuss Mestinon      As it turns out, patient has not been taking it the last 1-2 days, most likely washed out and not contributing  3. HTN     Not known for him     PRN hydralazine  For questions or updates, please contact [place] Please consult [email or web address] for contact info under Cardiology/STEMI.   Signed, Baldwin Jamaica, PA-C  07/31/2018 4:05 PM  Complete heart block narrow QRS escape  Myasthenia gravis on Mestinon  Hypertension    Reviewing the old charts, it is noted that his heart rate was in the 60s 10/18.  3/19 it was 53.  These records are from [facility]  We called the primary care office, and admit 5/19 his heart rate was 49.  Throughout his surgery today from the onset to the end his heart rates were in the low 40s.  I interpret these data to suggest that his complete heart block is not new.  Furthermore, he has not  been on Mestinon for more than 24 hours; his half-life is 4-5 hours.  It is my impression that is no longer contributing.  It was in fact beneficial clinically; so will be resumed.  With complete heart block not withstanding the stable longevity of it, I would still recommend pacing as a stability of escape rhythms is unpredictable.  I have reviewed this with the patient.  We will plan to observe him overnight and then make a decision as to whether the pacemaker should be put in in hospital or whether he can be discharging him to be done as an outpatient.  High blood pressure is noteworthy.  And while he denies a history of hypertension, 3 out of 4 records from [facility] have readings greater than 140

## 2018-07-31 NOTE — ED Notes (Signed)
Report called  

## 2018-08-01 ENCOUNTER — Encounter (HOSPITAL_COMMUNITY): Payer: Self-pay | Admitting: Nurse Practitioner

## 2018-08-01 DIAGNOSIS — Z9221 Personal history of antineoplastic chemotherapy: Secondary | ICD-10-CM | POA: Diagnosis not present

## 2018-08-01 DIAGNOSIS — K219 Gastro-esophageal reflux disease without esophagitis: Secondary | ICD-10-CM | POA: Diagnosis not present

## 2018-08-01 DIAGNOSIS — I10 Essential (primary) hypertension: Secondary | ICD-10-CM | POA: Diagnosis not present

## 2018-08-01 DIAGNOSIS — G7000 Myasthenia gravis without (acute) exacerbation: Secondary | ICD-10-CM | POA: Diagnosis not present

## 2018-08-01 DIAGNOSIS — Z888 Allergy status to other drugs, medicaments and biological substances status: Secondary | ICD-10-CM | POA: Diagnosis not present

## 2018-08-01 DIAGNOSIS — Z9849 Cataract extraction status, unspecified eye: Secondary | ICD-10-CM | POA: Diagnosis not present

## 2018-08-01 DIAGNOSIS — Z96611 Presence of right artificial shoulder joint: Secondary | ICD-10-CM | POA: Diagnosis not present

## 2018-08-01 DIAGNOSIS — Z8546 Personal history of malignant neoplasm of prostate: Secondary | ICD-10-CM

## 2018-08-01 DIAGNOSIS — Z881 Allergy status to other antibiotic agents status: Secondary | ICD-10-CM | POA: Diagnosis not present

## 2018-08-01 DIAGNOSIS — M1990 Unspecified osteoarthritis, unspecified site: Secondary | ICD-10-CM | POA: Diagnosis not present

## 2018-08-01 DIAGNOSIS — Z791 Long term (current) use of non-steroidal anti-inflammatories (NSAID): Secondary | ICD-10-CM | POA: Diagnosis not present

## 2018-08-01 DIAGNOSIS — I442 Atrioventricular block, complete: Secondary | ICD-10-CM | POA: Diagnosis not present

## 2018-08-01 HISTORY — DX: Personal history of malignant neoplasm of prostate: Z85.46

## 2018-08-01 NOTE — Progress Notes (Signed)
Progress Note  Patient Name: Kyle Bowen Date of Encounter: 08/01/2018  Primary Cardiologist: SK  Primary Electrophysiologist: SK   Patient Profile     72 y.o. male admitted 8/30 for complete heart block identified following cataract surgery.  Reviewing old records he had similar heart rates for about 4 months and this is presumed to be chronic.  Narrow QRS.  No associated symptoms.  He has myasthenia gravis and is on Mestinon and mycophenolate  Subjective   Without complaint.  Inpatient Medications    Scheduled Meds: . calcium carbonate  1 tablet Oral BID WC  . meloxicam  15 mg Oral Daily  . multivitamin with minerals  1 tablet Oral Daily  . mycophenolate  1,000 mg Oral BID  . pyridostigmine  60 mg Oral TID AC  . simvastatin  20 mg Oral Daily   Continuous Infusions:  PRN Meds: acetaminophen, hydrALAZINE   Vital Signs    Vitals:   07/31/18 2113 08/01/18 0000 08/01/18 0418 08/01/18 0737  BP: 140/79 (!) 143/58 136/69 138/73  Pulse:  (!) 54 75 (!) 115  Resp:  12 17 11   Temp:  98.1 F (36.7 C) 98.5 F (36.9 C) 98.5 F (36.9 C)  TempSrc:  Oral Oral Oral  SpO2:  95% 99% 98%  Weight:      Height:        Intake/Output Summary (Last 24 hours) at 08/01/2018 0903 Last data filed at 08/01/2018 0854 Gross per 24 hour  Intake -  Output 850 ml  Net -850 ml   Filed Weights   07/31/18 1423  Weight: 72.6 kg    Telemetry    High-grade heart block.  There appears to be times when it proved suggesting Wenckebach physiology- Personally Reviewed Interestingly, ventricular rates vary from the high 30s to the mid 80s  ECG    - Personally Reviewed rhythm with complete heart block  Physical Exam   GEN: No acute distress.   Neck: JVD  cmflat Cardiac: RRR, no  murmurs, rubs, or gallops.  Respiratory: Clear to auscultation bilaterally. GI: Soft, nontender, non-distended  MS:  edema; No deformity. Neuro:  Nonfocal  Psych: Normal affect  Skin Warm and dry    Labs    Chemistry Recent Labs  Lab 07/31/18 1516 07/31/18 1531  NA 139 139  K 3.9 4.0  CL 106 107  CO2  --  24  GLUCOSE 85 91  BUN 13 12  CREATININE 0.90 0.92  CALCIUM  --  9.0  PROT  --  6.2*  ALBUMIN  --  3.9  AST  --  25  ALT  --  16  ALKPHOS  --  87  BILITOT  --  1.4*  GFRNONAA  --  >60  GFRAA  --  >60  ANIONGAP  --  8     Hematology Recent Labs  Lab 07/31/18 1516 07/31/18 1531  WBC  --  7.2  RBC  --  4.54  HGB 14.3 14.1  HCT 42.0 43.0  MCV  --  94.7  MCH  --  31.1  MCHC  --  32.8  RDW  --  12.4  PLT  --  147*    Cardiac EnzymesNo results for input(s): TROPONINI in the last 168 hours.  Recent Labs  Lab 07/31/18 1515  TROPIPOC 0.00     BNPNo results for input(s): BNP, PROBNP in the last 168 hours.   DDimer No results for input(s): DDIMER in the last 168 hours.  Radiology    Dg Chest Portable 1 View  Result Date: 07/31/2018 CLINICAL DATA:  Arrhythmia.  No chest complaints. EXAM: PORTABLE CHEST 1 VIEW COMPARISON:  Chest x-ray dated September 06, 2016. FINDINGS: The heart size and mediastinal contours are within normal limits. Both lungs are clear. The visualized skeletal structures are unremarkable. Prior right total shoulder arthroplasty. IMPRESSION: No active disease. Electronically Signed   By: Titus Dubin M.D.   On: 07/31/2018 15:03         Assessment & Plan    Complete heart block narrow QRS escape  Myasthenia gravis on Mestinon  Hypertension    There is some irregularity of the ventricular rhythm but I do not see any reproducible association with P wave activity.  I suspect that this still represents complete heart block with autonomic variation of his high junctional escape rhythm.  Given his presumed longevity (see yesterday's note) we will discharge the patient and arrange for outpatient pacemaker implantation  The benefits and risks were reviewed including but not limited to death,  perforation, infection, lead  dislodgement and device malfunction.  The patient understands agrees and is willing to proceed.  I spoke with neurology at [facility] yesterday.  Was agreed that we would continue him on Mestinon  Signed, Virl Axe, MD  08/01/2018, 9:03 AM

## 2018-08-01 NOTE — Discharge Instructions (Signed)
***  PLEASE REMEMBER TO BRING ALL OF YOUR MEDICATIONS TO EACH OF YOUR FOLLOW-UP OFFICE VISITS.  

## 2018-08-01 NOTE — Progress Notes (Signed)
Discharge instructions given. Pt verbalized understanding and all questions were answered.  

## 2018-08-01 NOTE — Discharge Summary (Signed)
Discharge Summary    Patient ID: Kyle Bowen,  MRN: [id number], DOB/AGE: 01/13/1946 72 y.o.  Admit date: 07/31/2018 Discharge date: 08/01/2018  Primary Care Provider: Cyndy Freeze Primary Cardiologist: Virl Axe, MD  Discharge Diagnoses    Principal Problem:   Complete heart block [facility]) Active Problems:   Myasthenia Gravis   History of prostate cancer   Cataracts   Diagnostic Studies/Procedures    None _____________   History of Present Illness     72 y/o ? with a h/o myasthenia gravis, prostate cancer s/p resection and chemotherapy, cataracts, and OA.  He recently spend some time in [place], helping to care for his grandchildren, and since returning to [place], he has noted that it's taken a little longer to recover from his trip than what it has in the past.  He has remained relatively active, continuing to play golf and push-mow his yard.  He underwent cataract surgery on 8/30, and was noted to have bradycardia with evidence of heart block.  He was then referred to the [facility] ED, where he was found to be in hemodynamically stable complete heart block at a rate of 46bpm.  He was seen by EP and admitted for further evaluation.  Hospital Course     Consultants: None  Review of outpatient records indicated that patient has had bradycardia, with rates down into the 40's for some time.  No old ECG's were available for review.  It was felt that CHB was likely chronic.  Further, his use of mestinon for the treatment of myasthenia gravis and potential contribution to bradycardia was reviewed with his outpatient neurologist.  As he had not taken mestinon in >24 hrs prior to presentation, given short half-life, it was not felt that it was significantly contributing to CHB, and further, it was felt that clinical benefit outweighed risk, therefor, it was continued.  Mr. Cruces has remained bradycardic with complete heart block and rates in the 40's, but has also been hemodynamically stable  and asymptomatic.  In that setting, he is felt to be safe for discharge this AM and our office will contact him on 9/3, in order to arrange for elective PPM placement within the next week. _____________  Discharge Vitals Blood pressure 138/73, pulse (!) 115, temperature 98.5 F (36.9 C), temperature source Oral, resp. rate 11, height 5\' 7"  (1.702 m), weight 72.6 kg, SpO2 98 %.  Filed Weights   07/31/18 1423  Weight: 72.6 kg    Labs & Radiologic Studies    CBC Recent Labs    07/31/18 1516 07/31/18 1531  WBC  --  7.2  HGB 14.3 14.1  HCT 42.0 43.0  MCV  --  94.7  PLT  --  076*   Basic Metabolic Panel Recent Labs    07/31/18 1516 07/31/18 1531  NA 139 139  K 3.9 4.0  CL 106 107  CO2  --  24  GLUCOSE 85 91  BUN 13 12  CREATININE 0.90 0.92  CALCIUM  --  9.0  MG  --  2.2  PHOS  --  2.9   Liver Function Tests Recent Labs    07/31/18 1531  AST 25  ALT 16  ALKPHOS 87  BILITOT 1.4*  PROT 6.2*  ALBUMIN 3.9  _____________  Dg Chest Portable 1 View  Result Date: 07/31/2018 CLINICAL DATA:  Arrhythmia.  No chest complaints. EXAM: PORTABLE CHEST 1 VIEW COMPARISON:  Chest x-ray dated September 06, 2016. FINDINGS: The heart size and mediastinal  contours are within normal limits. Both lungs are clear. The visualized skeletal structures are unremarkable. Prior right total shoulder arthroplasty. IMPRESSION: No active disease. Electronically Signed   By: Titus Dubin M.D.   On: 07/31/2018 15:03   Disposition   Pt is being discharged home today in good condition.  Follow-up Plans & Appointments    Follow-up Information    Cyndy Freeze, MD Follow up.   Specialty:  Family Medicine Contact information: [place] [address] [phone]        Deboraha Sprang, MD Follow up.   Specialty:  Cardiology Why:  *We will contact you to arrange for permanent pacemaker placement* Contact information: [address] [place]  [address] [phone]           Discharge Medications   Allergies as of 08/01/2018      Reactions   Aluminum-containing Compounds Other (See Comments)   Myasthenic patient    Aminoglycosides Other (See Comments)   Myasthenic patient    Azithromycin Other (See Comments)   Myasthenic patient    Botulinum Toxins Other (See Comments)   Myasthenic patient    Calcium Channel Blockers Other (See Comments)   Myasthenic patient    Ciprofloxacin Other (See Comments)   Myasthenic patient    Colistin Other (See Comments)   Myasthenic patient    D Tubocurarine [tubocurarine] Other (See Comments)   Myasthenic patient    Erythromycin Base Other (See Comments)   Myasthenic patient    Gentamicin Other (See Comments)   Myasthenic patient    Iodinated Diagnostic Agents Other (See Comments)   Myasthenic patient    Kanamycin Other (See Comments)   Myasthenic patient    Macrolides And Ketolides Other (See Comments)   Myasthenic patient    Magnesium-containing Compounds Other (See Comments)   Myasthenic patient    Moxifloxacin Other (See Comments)   Myasthenic patient    Neomycin Other (See Comments)   Myasthenic patient    Norfloxacin Other (See Comments)   Myasthenic patient    Ofloxacin Other (See Comments)   Myasthenic patient    Pefloxacin Other (See Comments)   Myasthenic patient    Penicillamine Other (See Comments)   Myasthenic patient    Procainamide Other (See Comments)   Myasthenic patient    Propranolol Other (See Comments)   Myasthenic patient    Quinidine Other (See Comments)   Myasthenic patient    Quinine Derivatives Other (See Comments)   Myasthenic patient    Simethicone Other (See Comments)   Myasthenic patient    Streptomycin Other (See Comments)   Myasthenic patient    Succinylcholine Other (See Comments)   Myasthenic patient    Telithromycin Other (See Comments)   Myasthenic patient    Timolol Other (See Comments)   Myasthenic patient    Tobramycin  Other (See Comments)   Myasthenic patient    Vecuronium Other (See Comments)   Myasthenic patient       Medication List    STOP taking these medications   methocarbamol 500 MG tablet Commonly known as:  ROBAXIN   oxyCODONE-acetaminophen 5-325 MG tablet Commonly known as:  PERCOCET/ROXICET     TAKE these medications   aspirin 81 MG tablet Take 81 mg by mouth daily.   calcium carbonate 600 MG Tabs tablet Commonly known as:  OS-CAL Take 600 mg by mouth 2 (two) times daily with a meal.   meloxicam 15 MG tablet Commonly known as:  MOBIC Take 15  mg by mouth daily.   MENS MULTI VITAMIN & MINERAL Tabs Take 1 tablet by mouth daily.   mycophenolate 500 MG tablet Commonly known as:  CELLCEPT Take 1,000 mg by mouth 2 (two) times daily.   pyridostigmine 60 MG tablet Commonly known as:  MESTINON Take 60 mg by mouth 3 (three) times daily before meals.   simvastatin 40 MG tablet Commonly known as:  ZOCOR Take 20 mg by mouth daily.        Acute coronary syndrome (MI, NSTEMI, STEMI, etc) this admission?: No.    Outstanding Labs/Studies   Pt will be contacted to arrange for return to hospital and outpatient PPM placement.  Duration of Discharge Encounter   Greater than 30 minutes including physician time.  Signed, Murray Hodgkins, NP 08/01/2018, 11:07 AM

## 2018-08-01 NOTE — Progress Notes (Addendum)
Paged Cardiologist on call for patient's heart rate FYI: dropping to 39, has remained in 40s throughout the night. B/p 136/69. Asymptomatic. Easy to arouse. Resting with eyes closed at present. Awaiting call back.  [phone]- Fellow cards called back. Stat EKG ordered.

## 2018-08-01 NOTE — H&P (View-Only) (Signed)
Progress Note  Patient Name: Kyle Bowen Date of Encounter: 08/01/2018  Primary Cardiologist: SK  Primary Electrophysiologist: SK   Patient Profile     72 y.o. male admitted 8/30 for complete heart block identified following cataract surgery.  Reviewing old records he had similar heart rates for about 4 months and this is presumed to be chronic.  Narrow QRS.  No associated symptoms.  He has myasthenia gravis and is on Mestinon and mycophenolate  Subjective   Without complaint.  Inpatient Medications    Scheduled Meds: . calcium carbonate  1 tablet Oral BID WC  . meloxicam  15 mg Oral Daily  . multivitamin with minerals  1 tablet Oral Daily  . mycophenolate  1,000 mg Oral BID  . pyridostigmine  60 mg Oral TID AC  . simvastatin  20 mg Oral Daily   Continuous Infusions:  PRN Meds: acetaminophen, hydrALAZINE   Vital Signs    Vitals:   07/31/18 2113 08/01/18 0000 08/01/18 0418 08/01/18 0737  BP: 140/79 (!) 143/58 136/69 138/73  Pulse:  (!) 54 75 (!) 115  Resp:  12 17 11   Temp:  98.1 F (36.7 C) 98.5 F (36.9 C) 98.5 F (36.9 C)  TempSrc:  Oral Oral Oral  SpO2:  95% 99% 98%  Weight:      Height:        Intake/Output Summary (Last 24 hours) at 08/01/2018 0903 Last data filed at 08/01/2018 0854 Gross per 24 hour  Intake -  Output 850 ml  Net -850 ml   Filed Weights   07/31/18 1423  Weight: 72.6 kg    Telemetry    High-grade heart block.  There appears to be times when it proved suggesting Wenckebach physiology- Personally Reviewed Interestingly, ventricular rates vary from the high 30s to the mid 80s  ECG    - Personally Reviewed rhythm with complete heart block  Physical Exam   GEN: No acute distress.   Neck: JVD  cmflat Cardiac: RRR, no  murmurs, rubs, or gallops.  Respiratory: Clear to auscultation bilaterally. GI: Soft, nontender, non-distended  MS:  edema; No deformity. Neuro:  Nonfocal  Psych: Normal affect  Skin Warm and dry    Labs    Chemistry Recent Labs  Lab 07/31/18 1516 07/31/18 1531  NA 139 139  K 3.9 4.0  CL 106 107  CO2  --  24  GLUCOSE 85 91  BUN 13 12  CREATININE 0.90 0.92  CALCIUM  --  9.0  PROT  --  6.2*  ALBUMIN  --  3.9  AST  --  25  ALT  --  16  ALKPHOS  --  87  BILITOT  --  1.4*  GFRNONAA  --  >60  GFRAA  --  >60  ANIONGAP  --  8     Hematology Recent Labs  Lab 07/31/18 1516 07/31/18 1531  WBC  --  7.2  RBC  --  4.54  HGB 14.3 14.1  HCT 42.0 43.0  MCV  --  94.7  MCH  --  31.1  MCHC  --  32.8  RDW  --  12.4  PLT  --  147*    Cardiac EnzymesNo results for input(s): TROPONINI in the last 168 hours.  Recent Labs  Lab 07/31/18 1515  TROPIPOC 0.00     BNPNo results for input(s): BNP, PROBNP in the last 168 hours.   DDimer No results for input(s): DDIMER in the last 168 hours.  Radiology    Dg Chest Portable 1 View  Result Date: 07/31/2018 CLINICAL DATA:  Arrhythmia.  No chest complaints. EXAM: PORTABLE CHEST 1 VIEW COMPARISON:  Chest x-ray dated September 06, 2016. FINDINGS: The heart size and mediastinal contours are within normal limits. Both lungs are clear. The visualized skeletal structures are unremarkable. Prior right total shoulder arthroplasty. IMPRESSION: No active disease. Electronically Signed   By: Titus Dubin M.D.   On: 07/31/2018 15:03         Assessment & Plan    Complete heart block narrow QRS escape  Myasthenia gravis on Mestinon  Hypertension    There is some irregularity of the ventricular rhythm but I do not see any reproducible association with P wave activity.  I suspect that this still represents complete heart block with autonomic variation of his high junctional escape rhythm.  Given his presumed longevity (see yesterday's note) we will discharge the patient and arrange for outpatient pacemaker implantation  The benefits and risks were reviewed including but not limited to death,  perforation, infection, lead  dislodgement and device malfunction.  The patient understands agrees and is willing to proceed.  I spoke with neurology at [facility] yesterday.  Was agreed that we would continue him on Mestinon  Signed, Virl Axe, MD  08/01/2018, 9:03 AM

## 2018-08-04 ENCOUNTER — Telehealth: Payer: Self-pay

## 2018-08-04 ENCOUNTER — Other Ambulatory Visit: Payer: Medicare Other | Admitting: *Deleted

## 2018-08-04 DIAGNOSIS — I442 Atrioventricular block, complete: Secondary | ICD-10-CM

## 2018-08-04 NOTE — Telephone Encounter (Signed)
Per Dr Caryl Comes, it is OK to schedule pt for late procedure after 1600 on 9/4. Pt is in agreement. Instruction sheet and CHX soap will be left at the front desk for pt to pick up today.

## 2018-08-04 NOTE — Telephone Encounter (Signed)
-----   Message from Deboraha Sprang, MD sent at 08/01/2018 10:45 AM EDT ----- Rolanda Lundborg, can you call this gentleman on Tuesday and schedule him for a pacemaker implant thanks

## 2018-08-04 NOTE — Telephone Encounter (Signed)
Pt has decided to have PPM implanted on 9/4 with Dr Caryl Comes. Lab orders have been placed. Will speak with Dr Caryl Comes regarding time. There may be some scheduling conflicts on 9/4. The next available option will be 9/13. Pt will come by the office today for lab work regardless.

## 2018-08-05 ENCOUNTER — Other Ambulatory Visit: Payer: Self-pay

## 2018-08-05 ENCOUNTER — Telehealth: Payer: Self-pay | Admitting: Internal Medicine

## 2018-08-05 ENCOUNTER — Ambulatory Visit (HOSPITAL_COMMUNITY)
Admission: RE | Disposition: A | Discharge status: Home / Self Care | Payer: Self-pay | Source: Ambulatory Visit | Attending: Internal Medicine

## 2018-08-05 ENCOUNTER — Ambulatory Visit (HOSPITAL_COMMUNITY)
Admission: RE | Admit: 2018-08-05 | Discharge: 2018-08-06 | Disposition: A | Discharge status: Home / Self Care | Payer: Medicare Other | Source: Ambulatory Visit | Attending: Internal Medicine | Admitting: Internal Medicine

## 2018-08-05 ENCOUNTER — Ambulatory Visit: Payer: Medicare Other | Admitting: Cardiology

## 2018-08-05 DIAGNOSIS — Z9889 Other specified postprocedural states: Secondary | ICD-10-CM | POA: Insufficient documentation

## 2018-08-05 DIAGNOSIS — I10 Essential (primary) hypertension: Secondary | ICD-10-CM | POA: Diagnosis not present

## 2018-08-05 DIAGNOSIS — I442 Atrioventricular block, complete: Secondary | ICD-10-CM | POA: Diagnosis not present

## 2018-08-05 DIAGNOSIS — G7000 Myasthenia gravis without (acute) exacerbation: Secondary | ICD-10-CM | POA: Insufficient documentation

## 2018-08-05 DIAGNOSIS — Z959 Presence of cardiac and vascular implant and graft, unspecified: Secondary | ICD-10-CM

## 2018-08-05 HISTORY — PX: PACEMAKER IMPLANT: EP1218

## 2018-08-05 LAB — BASIC METABOLIC PANEL
BUN / CREAT RATIO: 16 (ref 10–24)
BUN: 16 mg/dL (ref 8–27)
CO2: 22 mmol/L (ref 20–29)
CREATININE: 1 mg/dL (ref 0.76–1.27)
Calcium: 9.3 mg/dL (ref 8.6–10.2)
Chloride: 104 mmol/L (ref 96–106)
GFR calc non Af Amer: 75 mL/min/{1.73_m2} (ref >59)
GFR, EST AFRICAN AMERICAN: 87 mL/min/{1.73_m2} (ref >59)
Glucose: 125 mg/dL — ABNORMAL HIGH (ref 65–99)
Potassium: 4.4 mmol/L (ref 3.5–5.2)
SODIUM: 141 mmol/L (ref 134–144)

## 2018-08-05 LAB — SURGICAL PCR SCREEN
MRSA, PCR: NEGATIVE
Staphylococcus aureus: POSITIVE — AB

## 2018-08-05 LAB — CBC
HEMATOCRIT: 41.8 % (ref 37.5–51.0)
Hemoglobin: 13.9 g/dL (ref 13.0–17.7)
MCH: 31.5 pg (ref 26.6–33.0)
MCHC: 33.3 g/dL (ref 31.5–35.7)
MCV: 95 fL (ref 79–97)
Platelets: 174 10*3/uL (ref 150–450)
RBC: 4.41 x10E6/uL (ref 4.14–5.80)
RDW: 13.2 % (ref 12.3–15.4)
WBC: 8.4 10*3/uL (ref 3.4–10.8)

## 2018-08-05 SURGERY — PACEMAKER IMPLANT

## 2018-08-05 MED ORDER — LABETALOL HCL 5 MG/ML IV SOLN
INTRAVENOUS | Status: DC | PRN
  Administered 2018-08-05: 20 mg via INTRAVENOUS

## 2018-08-05 MED ORDER — CEFAZOLIN SODIUM-DEXTROSE 2-4 GM/100ML-% IV SOLN
INTRAVENOUS | Status: AC
  Filled 2018-08-05: qty 100

## 2018-08-05 MED ORDER — CALCIUM CARBONATE 1250 (500 CA) MG PO TABS
1.0000 | ORAL_TABLET | Freq: Two times a day (BID) | ORAL | Status: DC
  Administered 2018-08-06: 500 mg via ORAL
  Filled 2018-08-05 (×2): qty 1

## 2018-08-05 MED ORDER — ONDANSETRON HCL 4 MG/2ML IJ SOLN: 4.0000 mg | Freq: Four times a day (QID) | INTRAMUSCULAR | Status: DC | PRN

## 2018-08-05 MED ORDER — CEFAZOLIN SODIUM-DEXTROSE 1-4 GM/50ML-% IV SOLN
1.0000 g | Freq: Four times a day (QID) | INTRAVENOUS | Status: AC
  Administered 2018-08-05 – 2018-08-06 (×3): 1 g via INTRAVENOUS
  Filled 2018-08-05 (×3): qty 50

## 2018-08-05 MED ORDER — LIDOCAINE HCL 1 % IJ SOLN
INTRAMUSCULAR | Status: AC
  Filled 2018-08-05: qty 20

## 2018-08-05 MED ORDER — FENTANYL CITRATE (PF) 100 MCG/2ML IJ SOLN
INTRAMUSCULAR | Status: AC
  Filled 2018-08-05: qty 2

## 2018-08-05 MED ORDER — CHLORHEXIDINE GLUCONATE 4 % EX LIQD: 60.0000 mL | Freq: Once | CUTANEOUS | Status: DC

## 2018-08-05 MED ORDER — SODIUM CHLORIDE 0.9 % IV SOLN
INTRAVENOUS | Status: AC
  Administered 2018-08-05: 19:00:00 via INTRAVENOUS

## 2018-08-05 MED ORDER — LABETALOL HCL 5 MG/ML IV SOLN
INTRAVENOUS | Status: AC
  Filled 2018-08-05: qty 4

## 2018-08-05 MED ORDER — SODIUM CHLORIDE 0.9 % IV SOLN
INTRAVENOUS | Status: DC
  Administered 2018-08-05 (×2): via INTRAVENOUS

## 2018-08-05 MED ORDER — HEPARIN (PORCINE) IN NACL 1000-0.9 UT/500ML-% IV SOLN
INTRAVENOUS | Status: AC
  Filled 2018-08-05: qty 500

## 2018-08-05 MED ORDER — ASPIRIN EC 81 MG PO TBEC
81.0000 mg | DELAYED_RELEASE_TABLET | Freq: Every day | ORAL | Status: DC
  Administered 2018-08-05 – 2018-08-06 (×2): 81 mg via ORAL
  Filled 2018-08-05 (×2): qty 1

## 2018-08-05 MED ORDER — MYCOPHENOLATE MOFETIL 250 MG PO CAPS
1000.0000 mg | ORAL_CAPSULE | Freq: Two times a day (BID) | ORAL | Status: DC
  Administered 2018-08-05 – 2018-08-06 (×2): 1000 mg via ORAL
  Filled 2018-08-05 (×2): qty 4

## 2018-08-05 MED ORDER — ACETAMINOPHEN 325 MG PO TABS: 325.0000 mg | ORAL_TABLET | ORAL | Status: DC | PRN

## 2018-08-05 MED ORDER — SIMVASTATIN 20 MG PO TABS
20.0000 mg | ORAL_TABLET | Freq: Every day | ORAL | Status: DC
  Administered 2018-08-05 – 2018-08-06 (×2): 20 mg via ORAL
  Filled 2018-08-05 (×2): qty 1

## 2018-08-05 MED ORDER — CEFAZOLIN SODIUM-DEXTROSE 2-4 GM/100ML-% IV SOLN
2.0000 g | INTRAVENOUS | Status: AC
  Administered 2018-08-05: 2 g via INTRAVENOUS

## 2018-08-05 MED ORDER — MUPIROCIN 2 % EX OINT
1.0000 "application " | TOPICAL_OINTMENT | Freq: Once | CUTANEOUS | Status: AC
  Administered 2018-08-05: 1 via TOPICAL
  Filled 2018-08-05: qty 22

## 2018-08-05 MED ORDER — ADULT MULTIVITAMIN W/MINERALS CH
1.0000 | ORAL_TABLET | Freq: Every day | ORAL | Status: DC
  Administered 2018-08-06: 1 via ORAL
  Filled 2018-08-05: qty 1

## 2018-08-05 MED ORDER — LIDOCAINE HCL (PF) 1 % IJ SOLN
INTRAMUSCULAR | Status: DC | PRN
  Administered 2018-08-05: 45 mL

## 2018-08-05 MED ORDER — SODIUM CHLORIDE 0.9 % IV SOLN
Freq: Once | INTRAVENOUS | Status: AC
  Administered 2018-08-05: 18:00:00
  Filled 2018-08-05 (×2): qty 500000

## 2018-08-05 MED ORDER — PYRIDOSTIGMINE BROMIDE 60 MG PO TABS
60.0000 mg | ORAL_TABLET | Freq: Three times a day (TID) | ORAL | Status: DC
  Administered 2018-08-06 (×2): 60 mg via ORAL
  Filled 2018-08-05 (×3): qty 1

## 2018-08-05 MED ORDER — MUPIROCIN 2 % EX OINT
TOPICAL_OINTMENT | CUTANEOUS | Status: AC
  Administered 2018-08-05: 1 via TOPICAL
  Filled 2018-08-05: qty 22

## 2018-08-05 MED ORDER — MIDAZOLAM HCL 5 MG/5ML IJ SOLN
INTRAMUSCULAR | Status: AC
  Filled 2018-08-05: qty 5

## 2018-08-05 MED ORDER — SODIUM CHLORIDE 0.9 % IV SOLN
INTRAVENOUS | Status: AC
  Filled 2018-08-05: qty 2

## 2018-08-05 MED ORDER — MELOXICAM 7.5 MG PO TABS
15.0000 mg | ORAL_TABLET | Freq: Every day | ORAL | Status: DC
  Administered 2018-08-06: 15 mg via ORAL
  Filled 2018-08-05: qty 2

## 2018-08-05 MED ORDER — LIDOCAINE HCL 1 % IJ SOLN
INTRAMUSCULAR | Status: AC
  Filled 2018-08-05: qty 40

## 2018-08-05 MED ORDER — SODIUM CHLORIDE 0.9 % IV SOLN
INTRAVENOUS | Status: DC
  Administered 2018-08-05: 16:00:00 via INTRAVENOUS

## 2018-08-05 SURGICAL SUPPLY — 13 items
CABLE SURGICAL S-101-97-12 (CABLE) ×3 IMPLANT
CATH RIGHTSITE C315HIS02 (CATHETERS) ×3 IMPLANT
ELECT DEFIB PAD ADLT CADENCE (PAD) ×3 IMPLANT
HEMOSTAT SURGICEL 2X4 FIBR (HEMOSTASIS) ×6 IMPLANT
LEAD CAPSURE NOVUS 5076-52CM (Lead) ×3 IMPLANT
LEAD SELECT SECURE 3830 383069 (Lead) ×1 IMPLANT
PACEMAKER ASSURITY DR-RF (Pacemaker) ×3 IMPLANT
PAD PRO RADIOLUCENT 2001M-C (PAD) ×3 IMPLANT
SELECT SECURE 3830 383069 (Lead) ×3 IMPLANT
SHEATH CLASSIC 7F (SHEATH) ×6 IMPLANT
SLITTER 6232ADJ (MISCELLANEOUS) ×3 IMPLANT
TRAY PACEMAKER INSERTION (PACKS) ×3 IMPLANT
WIRE HI TORQ VERSACORE-J 145CM (WIRE) ×3 IMPLANT

## 2018-08-05 NOTE — Discharge Summary (Signed)
ELECTROPHYSIOLOGY PROCEDURE DISCHARGE SUMMARY    Patient ID: Kyle Bowen,  MRN: [id number], DOB/AGE: [birth year] 73 y.o.  Admit date: 08/05/2018 Discharge date: 08/06/2018  Primary Care Physician: Street, Sharon Mt, MD  Electrophysiologist: Dr. Caryl Comes  Primary Discharge Diagnosis:  1. CHB  Secondary Discharge Diagnosis:  1. Myasthenia Gravis   Allergies  Allergen Reactions  . Aluminum-Containing Compounds Other (See Comments)    Myasthenic patient   . Aminoglycosides Other (See Comments)    Myasthenic patient   . Azithromycin Other (See Comments)    Myasthenic patient   . Botulinum Toxins Other (See Comments)    Myasthenic patient   . Calcium Channel Blockers Other (See Comments)    Myasthenic patient   . Ciprofloxacin Other (See Comments)    Myasthenic patient   . Colistin Other (See Comments)    Myasthenic patient   . D Tubocurarine [Tubocurarine] Other (See Comments)    Myasthenic patient   . Erythromycin Base Other (See Comments)    Myasthenic patient   . Gentamicin Other (See Comments)    Myasthenic patient   . Iodinated Diagnostic Agents Other (See Comments)    Myasthenic patient   . Kanamycin Other (See Comments)    Myasthenic patient   . Macrolides And Ketolides Other (See Comments)    Myasthenic patient   . Magnesium-Containing Compounds Other (See Comments)    Myasthenic patient   . Moxifloxacin Other (See Comments)    Myasthenic patient   . Neomycin Other (See Comments)    Myasthenic patient   . Norfloxacin Other (See Comments)    Myasthenic patient   . Ofloxacin Other (See Comments)    Myasthenic patient   . Pefloxacin Other (See Comments)    Myasthenic patient   . Penicillamine Other (See Comments)    Myasthenic patient   . Procainamide Other (See Comments)    Myasthenic patient   . Propranolol Other (See Comments)    Myasthenic patient   . Quinidine Other (See Comments)    Myasthenic patient   . Quinine Derivatives Other (See  Comments)    Myasthenic patient   . Simethicone Other (See Comments)    Myasthenic patient   . Streptomycin Other (See Comments)    Myasthenic patient   . Succinylcholine Other (See Comments)    Myasthenic patient   . Telithromycin Other (See Comments)    Myasthenic patient   . Timolol Other (See Comments)    Myasthenic patient   . Tobramycin Other (See Comments)    Myasthenic patient   . Vecuronium Other (See Comments)    Myasthenic patient      Procedures This Admission:  1.  Implantation of a SJM dual chamber PPM on 9/4/419 by Dr Caryl Comes.  The patient received a  St Jude pulse generator serial number [id number], Medtronic MRI compatible 5076 ventricular lead, Medtronic MRI compatible 5076 atrial lead serial number [id number] There were no immediate post procedure complications. 2.  CXR on 08/06/18 demonstrated no pneumothorax status post device implantation.   Brief HPI: Kyle Bowen is a 72 y.o. male was initially consulted in-patient with CHB, no reversible causes were identified, felt to likely not have been new and referred for outpatient implant.  Risks, benefits, and alternatives to PPM implantation were reviewed with the patient who wished to proceed.   Hospital Course:  The patient was admitted and underwent implantation of a PPM with details as outlined above. He was monitored on telemetry overnight which demonstrated SR, occasional ST,  no VT.  Left chest was without hematoma or ecchymosis.  The device was interrogated and found to be functioning normally.  CXR was obtained and demonstrated no pneumothorax status post device implantation.  Wound care, arm mobility, and restrictions were reviewed with the patient.  The patient feels well this morning, no CP or SOB, he was examined by Dr. Caryl Comes and considered stable for discharge to home.    Physical Exam: Vitals:   08/06/18 1100 08/06/18 1200 08/06/18 1202 08/06/18 1300  BP: 125/79 (!) 153/81 (!) 149/80 (!) 152/87  Pulse:  68 62 62 67  Resp: (!) 21 15 15 18   Temp:  98.1 F (36.7 C)    TempSrc:  Oral    SpO2: 99% 99% 99% 97%  Weight:      Height:        GEN- The patient is well appearing, alert and oriented x 3 today.   HEENT: normocephalic, atraumatic; sclera clear, conjunctiva pink; hearing intact; oropharynx clear; neck supple, no JVP Lungs- CTA b/l, normal work of breathing.  No wheezes, rales, rhonchi Heart- RRR, no murmurs, rubs or gallops, PMI not laterally displaced GI- soft, non-tender, non-distended Extremities- no clubbing, cyanosis, or edema MS- no significant deformity or atrophy Skin- warm and dry, no rash or lesion, left chest without hematoma/ecchymosis Psych- euthymic mood, full affect Neuro- no gross deficits   Labs:   Lab Results  Component Value Date   WBC 8.4 08/04/2018   HGB 13.9 08/04/2018   HCT 41.8 08/04/2018   MCV 95 08/04/2018   PLT 174 08/04/2018    Recent Labs  Lab 07/31/18 1531 08/04/18 1450  NA 139 141  K 4.0 4.4  CL 107 104  CO2 24 22  BUN 12 16  CREATININE 0.92 1.00  CALCIUM 9.0 9.3  PROT 6.2*  --   BILITOT 1.4*  --   ALKPHOS 87  --   ALT 16  --   AST 25  --   GLUCOSE 91 125*    Discharge Medications:  Allergies as of 08/06/2018      Reactions   Aluminum-containing Compounds Other (See Comments)   Myasthenic patient    Aminoglycosides Other (See Comments)   Myasthenic patient    Azithromycin Other (See Comments)   Myasthenic patient    Botulinum Toxins Other (See Comments)   Myasthenic patient    Calcium Channel Blockers Other (See Comments)   Myasthenic patient    Ciprofloxacin Other (See Comments)   Myasthenic patient    Colistin Other (See Comments)   Myasthenic patient    D Tubocurarine [tubocurarine] Other (See Comments)   Myasthenic patient    Erythromycin Base Other (See Comments)   Myasthenic patient    Gentamicin Other (See Comments)   Myasthenic patient    Iodinated Diagnostic Agents Other (See Comments)   Myasthenic  patient    Kanamycin Other (See Comments)   Myasthenic patient    Macrolides And Ketolides Other (See Comments)   Myasthenic patient    Magnesium-containing Compounds Other (See Comments)   Myasthenic patient    Moxifloxacin Other (See Comments)   Myasthenic patient    Neomycin Other (See Comments)   Myasthenic patient    Norfloxacin Other (See Comments)   Myasthenic patient    Ofloxacin Other (See Comments)   Myasthenic patient    Pefloxacin Other (See Comments)   Myasthenic patient    Penicillamine Other (See Comments)   Myasthenic patient    Procainamide Other (See Comments)   Myasthenic  patient    Propranolol Other (See Comments)   Myasthenic patient    Quinidine Other (See Comments)   Myasthenic patient    Quinine Derivatives Other (See Comments)   Myasthenic patient    Simethicone Other (See Comments)   Myasthenic patient    Streptomycin Other (See Comments)   Myasthenic patient    Succinylcholine Other (See Comments)   Myasthenic patient    Telithromycin Other (See Comments)   Myasthenic patient    Timolol Other (See Comments)   Myasthenic patient    Tobramycin Other (See Comments)   Myasthenic patient    Vecuronium Other (See Comments)   Myasthenic patient       Medication List    TAKE these medications   aspirin 81 MG tablet Take 81 mg by mouth daily.   calcium carbonate 600 MG Tabs tablet Commonly known as:  OS-CAL Take 600 mg by mouth 2 (two) times daily with a meal.   meloxicam 15 MG tablet Commonly known as:  MOBIC Take 15 mg by mouth daily.   MENS MULTI VITAMIN & MINERAL Tabs Take 1 tablet by mouth daily.   mycophenolate 500 MG tablet Commonly known as:  CELLCEPT Take 1,000 mg by mouth 2 (two) times daily.   pyridostigmine 60 MG tablet Commonly known as:  MESTINON Take 60 mg by mouth 3 (three) times daily before meals.   simvastatin 40 MG tablet Commonly known as:  ZOCOR Take 20 mg by mouth daily.       Disposition:   Home  Discharge Instructions    Diet - low sodium heart healthy   Complete by:  As directed    Increase activity slowly   Complete by:  As directed      Follow-up Information    [facility] Follow up on 08/17/2018.   Specialty:  Cardiology Why:  4:00PM, wound check visit Contact information: [address] [place]       Deboraha Sprang, MD Follow up on 11/17/2018.   Specialty:  Cardiology Why:  3:15PM Contact information: [address] [place] [address] [phone]           Duration of Discharge Encounter: Greater than 30 minutes including physician time.  Venetia Night, PA-C 08/06/2018 1:07 PM

## 2018-08-05 NOTE — Interval H&P Note (Signed)
History and Physical Interval Note:  08/05/2018 3:08 PM  Kyle Bowen  has presented today for surgery, with the diagnosis of hb  The various methods of treatment have been discussed with the patient and family. After consideration of risks, benefits and other options for treatment, the patient has consented to  Procedure(s): PACEMAKER IMPLANT - Dual Chamber (N/A) as a surgical intervention .  The patient's history has been reviewed, patient examined, no change in status, stable for surgery.  I have reviewed the patient's chart and labs.  Questions were answered to the patient's satisfaction.     Virl Axe  For pacemaker   reisks reveiwed again

## 2018-08-05 NOTE — Progress Notes (Signed)
Received patient VS stable BP (!) 176/94 (BP Location: Right Arm)   Pulse 62   Temp 98.1 F (36.7 C) (Oral)   Resp 13   Ht 5\' 7"  (1.702 m)   Wt 73.3 kg   SpO2 100%   BMI 25.33 kg/m  No complains of pain, incision looks clean, no bruising, no oozing with dermabond intact.  Patient oriented to room, call bell placed within reach, bed in lowest position.  Pads will remain in place tonight no noted ectopy on monior, Zoll placed at bedside.

## 2018-08-05 NOTE — Telephone Encounter (Signed)
Pt calling to confirm that Dial soap was OK for pre procedure shower since he forgot to pick up the CHX yesterday. I advised pt this was OK. We also reviewed the remainder of his procedure instructions and he verbalized understanding and had no additional questions.

## 2018-08-05 NOTE — Telephone Encounter (Signed)
° °  Patient has questions about procedure scheduled for today

## 2018-08-06 ENCOUNTER — Encounter (HOSPITAL_COMMUNITY): Payer: Self-pay | Admitting: Internal Medicine

## 2018-08-06 ENCOUNTER — Ambulatory Visit (HOSPITAL_COMMUNITY): Payer: Medicare Other

## 2018-08-06 DIAGNOSIS — I442 Atrioventricular block, complete: Secondary | ICD-10-CM | POA: Diagnosis not present

## 2018-08-06 DIAGNOSIS — I10 Essential (primary) hypertension: Secondary | ICD-10-CM | POA: Diagnosis not present

## 2018-08-06 DIAGNOSIS — G7000 Myasthenia gravis without (acute) exacerbation: Secondary | ICD-10-CM | POA: Diagnosis not present

## 2018-08-06 DIAGNOSIS — Z950 Presence of cardiac pacemaker: Secondary | ICD-10-CM | POA: Diagnosis not present

## 2018-08-06 DIAGNOSIS — Z9889 Other specified postprocedural states: Secondary | ICD-10-CM | POA: Diagnosis not present

## 2018-08-06 MED ORDER — SODIUM CHLORIDE 0.9 % IV SOLN
INTRAVENOUS | Status: DC | PRN
  Administered 2018-08-06: 11:00:00 via INTRAVENOUS

## 2018-08-06 NOTE — Plan of Care (Signed)
  Problem: Education: Goal: Knowledge of General Education information will improve Description Including pain rating scale, medication(s)/side effects and non-pharmacologic comfort measures Outcome: Completed/Met   Problem: Health Behavior/Discharge Planning: Goal: Ability to manage health-related needs will improve Outcome: Completed/Met   Problem: Clinical Measurements: Goal: Ability to maintain clinical measurements within normal limits will improve Outcome: Completed/Met Goal: Will remain free from infection Outcome: Completed/Met Goal: Diagnostic test results will improve Outcome: Completed/Met Goal: Respiratory complications will improve Outcome: Completed/Met Goal: Cardiovascular complication will be avoided Outcome: Completed/Met   Problem: Activity: Goal: Risk for activity intolerance will decrease Outcome: Completed/Met   Problem: Nutrition: Goal: Adequate nutrition will be maintained Outcome: Completed/Met   Problem: Coping: Goal: Level of anxiety will decrease Outcome: Completed/Met   Problem: Elimination: Goal: Will not experience complications related to bowel motility Outcome: Completed/Met Goal: Will not experience complications related to urinary retention Outcome: Completed/Met   Problem: Pain Managment: Goal: General experience of comfort will improve Outcome: Completed/Met   Problem: Safety: Goal: Ability to remain free from injury will improve Outcome: Completed/Met   Problem: Skin Integrity: Goal: Risk for impaired skin integrity will decrease Outcome: Completed/Met   Problem: Education: Goal: Knowledge of cardiac device and self-care will improve Outcome: Completed/Met Goal: Ability to safely manage health related needs after discharge will improve Outcome: Completed/Met Goal: Individualized Educational Video(s) Outcome: Completed/Met   Problem: Cardiac: Goal: Ability to achieve and maintain adequate cardiopulmonary perfusion will  improve Outcome: Completed/Met

## 2018-08-06 NOTE — Discharge Instructions (Signed)
° ° °  Supplemental Discharge Instructions for  Pacemaker/Defibrillator Patients  Activity No heavy lifting or vigorous activity with your left/right arm for 6 to 8 weeks.  Do not raise your left/right arm above your head for one week.  Gradually raise your affected arm as drawn below.              08/09/18                       08/10/18                       08/11/18                 08/12/18 __  NO DRIVING for 1 week  ; you may begin driving on  9/53/20  .  WOUND CARE - Keep the wound area clean and dry.  Do not get this area wet for 24 hours. you may shower on  08/06/18 evening . - The tape/steri-strips on your wound will fall off; do not pull them off.  No bandage is needed on the site.  DO  NOT apply any creams, oils, or ointments to the wound area. - If you notice any drainage or discharge from the wound, any swelling or bruising at the site, or you develop a fever > 101? F after you are discharged home, call the office at once.  Special Instructions - You are still able to use cellular telephones; use the ear opposite the side where you have your pacemaker/defibrillator.  Avoid carrying your cellular phone near your device. - When traveling through airports, show security personnel your identification card to avoid being screened in the metal detectors.  Ask the security personnel to use the hand wand. - Avoid arc welding equipment, MRI testing (magnetic resonance imaging), TENS units (transcutaneous nerve stimulators).  Call the office for questions about other devices. - Avoid electrical appliances that are in poor condition or are not properly grounded. - Microwave ovens are safe to be near or to operate.  Additional information for defibrillator patients should your device go off: - If your device goes off ONCE and you feel fine afterward, notify the device clinic nurses. - If your device goes off ONCE and you do not feel well afterward, call 911. - If your device goes off TWICE, call  911. - If your device goes off THREE times in one day, call 911.  DO NOT DRIVE YOURSELF OR A FAMILY MEMBER WITH A DEFIBRILLATOR TO THE HOSPITAL--CALL 911.

## 2018-08-10 MED FILL — Lidocaine HCl Local Inj 1%: INTRAMUSCULAR | Qty: 60 | Status: AC

## 2018-08-10 MED FILL — Heparin Sod (Porcine)-NaCl IV Soln 1000 Unit/500ML-0.9%: INTRAVENOUS | Qty: 500 | Status: AC

## 2018-08-17 ENCOUNTER — Ambulatory Visit (INDEPENDENT_AMBULATORY_CARE_PROVIDER_SITE_OTHER): Payer: Medicare Other | Admitting: *Deleted

## 2018-08-17 DIAGNOSIS — I442 Atrioventricular block, complete: Secondary | ICD-10-CM

## 2018-08-17 LAB — CUP PACEART INCLINIC DEVICE CHECK
Brady Statistic RA Percent Paced: 23 %
Brady Statistic RV Percent Paced: 99.77 %
Date Time Interrogation Session: 20190916170149
Implantable Lead Location: 753859
Implantable Lead Model: 5076
Lead Channel Impedance Value: 525 Ohm
Lead Channel Pacing Threshold Amplitude: 1 V
Lead Channel Pacing Threshold Pulse Width: 0.5 ms
Lead Channel Pacing Threshold Pulse Width: 1 ms
Lead Channel Sensing Intrinsic Amplitude: 6.1 mV
Lead Channel Setting Pacing Amplitude: 3.5 V
Lead Channel Setting Pacing Amplitude: 5 V
Lead Channel Setting Pacing Pulse Width: 1 ms
Lead Channel Setting Sensing Sensitivity: 2 mV
MDC IDC LEAD IMPLANT DT: 20190904
MDC IDC LEAD IMPLANT DT: 20190904
MDC IDC LEAD LOCATION: 753860
MDC IDC MSMT BATTERY REMAINING LONGEVITY: 42 mo
MDC IDC MSMT BATTERY VOLTAGE: 3.01 V
MDC IDC MSMT LEADCHNL RA IMPEDANCE VALUE: 512.5 Ohm
MDC IDC MSMT LEADCHNL RA PACING THRESHOLD AMPLITUDE: 0.5 V
MDC IDC MSMT LEADCHNL RA PACING THRESHOLD AMPLITUDE: 0.5 V
MDC IDC MSMT LEADCHNL RA PACING THRESHOLD PULSEWIDTH: 0.5 ms
MDC IDC MSMT LEADCHNL RA SENSING INTR AMPL: 5 mV
MDC IDC MSMT LEADCHNL RV PACING THRESHOLD AMPLITUDE: 1 V
MDC IDC MSMT LEADCHNL RV PACING THRESHOLD PULSEWIDTH: 1 ms
MDC IDC PG IMPLANT DT: 20190904
Pulse Gen Model: 2272
Pulse Gen Serial Number: 9058775

## 2018-08-17 NOTE — Progress Notes (Signed)
Wound check appointment. Dermabond removed. Wound without redness. Small amount of edema noted, JA assessed incision site no recommendations, pt educated to call if edema does not subside. Incision edges approximated, wound well healed. Normal device function. Thresholds, sensing, and impedances consistent with implant measurements. Non-Selective pacing until LOC at 0.75V. RV output programmed to 5.0V@ 1.13ms until 91 day f/u. Histogram distribution appropriate for patient and level of activity. AMS~ AT <30sec. Patient educated about wound care, arm mobility, lifting restrictions. ROV 11/17/2018 w/ SK

## 2018-08-18 DIAGNOSIS — E785 Hyperlipidemia, unspecified: Secondary | ICD-10-CM | POA: Diagnosis not present

## 2018-08-18 DIAGNOSIS — Z125 Encounter for screening for malignant neoplasm of prostate: Secondary | ICD-10-CM | POA: Diagnosis not present

## 2018-08-18 DIAGNOSIS — Z79899 Other long term (current) drug therapy: Secondary | ICD-10-CM | POA: Diagnosis not present

## 2018-08-25 DIAGNOSIS — E785 Hyperlipidemia, unspecified: Secondary | ICD-10-CM | POA: Diagnosis not present

## 2018-08-25 DIAGNOSIS — M170 Bilateral primary osteoarthritis of knee: Secondary | ICD-10-CM | POA: Diagnosis not present

## 2018-08-25 DIAGNOSIS — E8881 Metabolic syndrome: Secondary | ICD-10-CM | POA: Diagnosis not present

## 2018-08-25 DIAGNOSIS — Z23 Encounter for immunization: Secondary | ICD-10-CM | POA: Diagnosis not present

## 2018-08-25 DIAGNOSIS — G7000 Myasthenia gravis without (acute) exacerbation: Secondary | ICD-10-CM | POA: Diagnosis not present

## 2018-08-25 DIAGNOSIS — Z0000 Encounter for general adult medical examination without abnormal findings: Secondary | ICD-10-CM | POA: Diagnosis not present

## 2018-10-06 DIAGNOSIS — M19012 Primary osteoarthritis, left shoulder: Secondary | ICD-10-CM | POA: Diagnosis not present

## 2018-10-15 DIAGNOSIS — Z950 Presence of cardiac pacemaker: Secondary | ICD-10-CM | POA: Diagnosis not present

## 2018-10-15 DIAGNOSIS — Z79899 Other long term (current) drug therapy: Secondary | ICD-10-CM | POA: Diagnosis not present

## 2018-10-15 DIAGNOSIS — G7000 Myasthenia gravis without (acute) exacerbation: Secondary | ICD-10-CM | POA: Diagnosis not present

## 2018-10-15 DIAGNOSIS — R001 Bradycardia, unspecified: Secondary | ICD-10-CM | POA: Diagnosis not present

## 2018-10-22 ENCOUNTER — Other Ambulatory Visit: Payer: Self-pay

## 2018-11-17 ENCOUNTER — Encounter: Payer: Self-pay | Admitting: Internal Medicine

## 2018-11-17 ENCOUNTER — Ambulatory Visit (INDEPENDENT_AMBULATORY_CARE_PROVIDER_SITE_OTHER): Payer: Medicare Other | Admitting: Internal Medicine

## 2018-11-17 VITALS — BP 150/82 | HR 78 | Ht 67.0 in | Wt 164.8 lb

## 2018-11-17 DIAGNOSIS — I442 Atrioventricular block, complete: Secondary | ICD-10-CM

## 2018-11-17 DIAGNOSIS — Z950 Presence of cardiac pacemaker: Secondary | ICD-10-CM

## 2018-11-17 NOTE — Patient Instructions (Addendum)
Medication Instructions:  Your physician recommends that you continue on your current medications as directed. Please refer to the Current Medication list given to you today.  Labwork: None ordered.  Testing/Procedures: None ordered.  Follow-Up: Your physician recommends that you schedule a follow-up appointment in:   9 months with Dr Caryl Comes   Remote monitoring is used to monitor your Pacemaker from home. This monitoring reduces the number of office visits required to check your device to one time per year. It allows [place] to keep an eye on the functioning of your device to ensure it is working properly. You are scheduled for a device check from home on 02/16/19. You may send your transmission at any time that day. If you have a wireless device, the transmission will be sent automatically. After your physician reviews your transmission, you will receive a postcard with your next transmission date.    Any Other Special Instructions Will Be Listed Below (If Applicable).     If you need a refill on your cardiac medications before your next appointment, please call your pharmacy.

## 2018-11-17 NOTE — Progress Notes (Signed)
Patient Care Team: Street, Sharon Mt, MD as PCP - General (Family Medicine) Deboraha Sprang, MD as PCP - Cardiology (Cardiology)   HPI  Kyle Bowen is a 72 y.o. male Seen in follow-up for pacemaker implanted 9/19 for complete heart block.  RV apical pacing with both passive and active leads were associate with complex ventricular ectopy; hence, the lead was moved to the HIS Position   Initially had presented with slow heart rates for some months with a narrow QRS escape.  There are a few associated symptoms.  His history of myasthenia gravis; and discussed with his doctors at [facility] prior to device implantation.   No chest pain; some tiredenss but no exercise intolerance  Past Medical History:  Diagnosis Date  . Arthritis   . Cancer ([place]) 89   prostate   . Complete heart block ([place]) 07/31/2018  . Complication of anesthesia    occ bp drops after surgery whrn getting up   . GERD (gastroesophageal reflux disease)    occ  . Heart block   . History of myasthenia gravis   . History of prostate cancer 08/01/2018  . Myasthenia ([place])   . Myasthenia gravis without (acute) exacerbation ([place]) 03/06/2016  . S/P shoulder replacement 10/14/2014    Past Surgical History:  Procedure Laterality Date  . CATARACT EXTRACTION    . HERNIA REPAIR Left 05   inguinal  . PACEMAKER IMPLANT N/A 08/05/2018   Procedure: PACEMAKER IMPLANT - Dual Chamber;  Surgeon: Deboraha Sprang, MD;  Location: [facility];  Service: Cardiovascular;  Laterality: N/A;  . PENILE PROSTHESIS IMPLANT  93   00.08 replaced  . PROSTATE SURGERY  89   ca  . TONSILLECTOMY    . TOTAL SHOULDER ARTHROPLASTY Right 10/14/2014   Procedure: RIGHT TOTAL SHOULDER ARTHROPLASTY;  Surgeon: Augustin Schooling, MD;  Location: [facility];  Service: Orthopedics;  Laterality: Right;    Current Meds  Medication Sig  . aspirin 81 MG tablet Take 81 mg by mouth daily.  . calcium carbonate (OS-CAL) 600 MG TABS tablet Take 600  mg by mouth 2 (two) times daily with a meal.  . meloxicam (MOBIC) 15 MG tablet Take 15 mg by mouth daily.  . Multiple Vitamins-Minerals (MENS MULTI VITAMIN & MINERAL) TABS Take 1 tablet by mouth daily.  . mycophenolate (CELLCEPT) 500 MG tablet Take 1,000 mg by mouth 2 (two) times daily.  Marland Kitchen pyridostigmine (MESTINON) 60 MG tablet Take 60 mg by mouth 3 (three) times daily before meals.  . simvastatin (ZOCOR) 40 MG tablet Take 20 mg by mouth daily.     Allergies  Allergen Reactions  . Aluminum-Containing Compounds Other (See Comments)    Myasthenic patient   . Aminoglycosides Other (See Comments)    Myasthenic patient   . Azithromycin Other (See Comments)    Myasthenic patient   . Botulinum Toxins Other (See Comments)    Myasthenic patient   . Calcium Channel Blockers Other (See Comments)    Myasthenic patient   . Ciprofloxacin Other (See Comments)    Myasthenic patient   . Colistin Other (See Comments)    Myasthenic patient   . D Tubocurarine [Tubocurarine] Other (See Comments)    Myasthenic patient   . Erythromycin Base Other (See Comments)    Myasthenic patient   . Gentamicin Other (See Comments)    Myasthenic patient   . Iodinated Diagnostic Agents Other (See Comments)    Myasthenic patient   .  Kanamycin Other (See Comments)    Myasthenic patient   . Macrolides And Ketolides Other (See Comments)    Myasthenic patient   . Magnesium-Containing Compounds Other (See Comments)    Myasthenic patient   . Moxifloxacin Other (See Comments)    Myasthenic patient   . Neomycin Other (See Comments)    Myasthenic patient   . Norfloxacin Other (See Comments)    Myasthenic patient   . Ofloxacin Other (See Comments)    Myasthenic patient   . Pefloxacin Other (See Comments)    Myasthenic patient   . Penicillamine Other (See Comments)    Myasthenic patient   . Procainamide Other (See Comments)    Myasthenic patient   . Propranolol Other (See Comments)    Myasthenic patient   .  Quinidine Other (See Comments)    Myasthenic patient   . Quinine Derivatives Other (See Comments)    Myasthenic patient   . Simethicone Other (See Comments)    Myasthenic patient   . Streptomycin Other (See Comments)    Myasthenic patient   . Succinylcholine Other (See Comments)    Myasthenic patient   . Telithromycin Other (See Comments)    Myasthenic patient   . Timolol Other (See Comments)    Myasthenic patient   . Tobramycin Other (See Comments)    Myasthenic patient   . Vecuronium Other (See Comments)    Myasthenic patient       Review of Systems negative except from HPI and PMH  Physical Exam BP (!) 150/82   Pulse 78   Ht 5\' 7"  (1.702 m)   Wt 164 lb 12.8 oz (74.8 kg)   SpO2 95%   BMI 25.81 kg/m  Well developed and well nourished in no acute distress HENT normal E scleral and icterus clear Neck Supple JVP flat; carotids brisk and full Clear to ausculation Device pocket well healed; without hematoma or erythema.  There is no tethering Regular rate and rhythm, no murmurs gallops or rub Soft with active bowel sounds No clubbing cyanosis  Edema Alert and oriented, grossly normal motor and sensory function Skin Warm and Dry  ECG sinus @ 78 17/14/42  Assessment and  Plan   Complete heart block  Atrial tachycardia  Pacemaker-[employer]-His  Device function is normal.  Reviewed the physiology of his device.  Sleep issues and fatigue issues are not likely related to his device.  We will need to keep an eye on LV function.  Did not find an assessment in the chart.  We spent more than 50% of our >25 min visit in face to face counseling regarding the above   Current medicines are reviewed at length with the patient today .  The patient does not  have concerns regarding medicines.

## 2018-12-08 DIAGNOSIS — S0500XA Injury of conjunctiva and corneal abrasion without foreign body, unspecified eye, initial encounter: Secondary | ICD-10-CM | POA: Diagnosis not present

## 2018-12-08 DIAGNOSIS — J0100 Acute maxillary sinusitis, unspecified: Secondary | ICD-10-CM | POA: Diagnosis not present

## 2018-12-09 DIAGNOSIS — H1033 Unspecified acute conjunctivitis, bilateral: Secondary | ICD-10-CM | POA: Diagnosis not present

## 2019-02-08 ENCOUNTER — Telehealth: Payer: Self-pay

## 2019-02-08 DIAGNOSIS — Z01818 Encounter for other preprocedural examination: Secondary | ICD-10-CM | POA: Diagnosis not present

## 2019-02-08 DIAGNOSIS — H2511 Age-related nuclear cataract, right eye: Secondary | ICD-10-CM | POA: Diagnosis not present

## 2019-02-08 NOTE — Telephone Encounter (Signed)
   [facility] Pre-operative Risk Assessment    Request for surgical clearance:  1. What type of surgery is being performed? Cataract Extraction    2. When is this surgery scheduled? 02/24/19   3. What type of clearance is required (medical clearance vs. Pharmacy clearance to hold med vs. Both)? Medical clearance   4. Are there any medications that need to be held prior to surgery and how long? None listed   5. Practice name and name of physician performing surgery? [facility]   6. What is your office phone number [phone] ex: [phone]    7.   What is your office fax number [phone]  8.   Anesthesia type (None, local, MAC, general) ? IV Sedation   Jacinta Shoe 02/08/2019, 3:13 PM  _________________________________________________________________   (provider comments below)

## 2019-02-08 NOTE — Telephone Encounter (Signed)
Pt does have permanent pacemaker.

## 2019-02-08 NOTE — Telephone Encounter (Deleted)
   Primary Cardiologist:Steven Caryl Comes, MD  Chart reviewed as part of pre-operative protocol coverage. Because of Kyle Bowen's past medical history and time since last visit, he/she will require a follow-up visit in order to better assess preoperative cardiovascular risk.  Pre-op covering staff: - Please schedule appointment and call patient to inform them. - Please contact requesting surgeon's office via preferred method (i.e, phone, fax) to inform them of need for appointment prior to surgery.  If applicable, this message will also be routed to pharmacy pool and/or primary cardiologist for input on holding anticoagulant/antiplatelet agent as requested below so that this information is available at time of patient's appointment.   Cecilie Kicks, NP  02/08/2019, 3:35 PM

## 2019-02-08 NOTE — Telephone Encounter (Signed)
   Primary Cardiologist: Virl Axe, MD  Chart reviewed as part of pre-operative protocol coverage. Given past medical history and time since last visit, based on ACC/AHA guidelines, KRISTIAN MOGG would be at acceptable risk for the planned procedure without further cardiovascular testing.   I will route this recommendation to the requesting party via Epic fax function and remove from pre-op pool.  Please call with questions.  Cecilie Kicks, NP 02/08/2019, 3:36 PM

## 2019-02-17 ENCOUNTER — Ambulatory Visit (INDEPENDENT_AMBULATORY_CARE_PROVIDER_SITE_OTHER): Payer: Medicare Other | Admitting: *Deleted

## 2019-02-17 ENCOUNTER — Other Ambulatory Visit: Payer: Self-pay

## 2019-02-17 DIAGNOSIS — I442 Atrioventricular block, complete: Secondary | ICD-10-CM

## 2019-02-17 LAB — CUP PACEART REMOTE DEVICE CHECK
Battery Remaining Longevity: 86 mo
Battery Remaining Percentage: 95.5 %
Battery Voltage: 2.99 V
Brady Statistic AP VS Percent: 1 %
Brady Statistic AS VS Percent: 1 %
Date Time Interrogation Session: 20200318060021
Implantable Lead Implant Date: 20190904
Implantable Lead Implant Date: 20190904
Implantable Lead Location: 753859
Implantable Lead Location: 753860
Implantable Lead Model: 3830
Implantable Pulse Generator Implant Date: 20190904
Lead Channel Impedance Value: 440 Ohm
Lead Channel Pacing Threshold Amplitude: 0.5 V
Lead Channel Pacing Threshold Amplitude: 1.25 V
Lead Channel Pacing Threshold Pulse Width: 1 ms
Lead Channel Sensing Intrinsic Amplitude: 2.4 mV
Lead Channel Sensing Intrinsic Amplitude: 5 mV
Lead Channel Setting Pacing Amplitude: 2.5 V
Lead Channel Setting Pacing Pulse Width: 1 ms
Lead Channel Setting Sensing Sensitivity: 2 mV
MDC IDC MSMT LEADCHNL RA PACING THRESHOLD PULSEWIDTH: 0.5 ms
MDC IDC MSMT LEADCHNL RV IMPEDANCE VALUE: 550 Ohm
MDC IDC PG SERIAL: 9058775
MDC IDC SET LEADCHNL RA PACING AMPLITUDE: 2 V
MDC IDC STAT BRADY AP VP PERCENT: 15 %
MDC IDC STAT BRADY AS VP PERCENT: 85 %
MDC IDC STAT BRADY RA PERCENT PACED: 15 %
MDC IDC STAT BRADY RV PERCENT PACED: 99 %

## 2019-02-25 ENCOUNTER — Encounter: Payer: Self-pay | Admitting: Cardiology

## 2019-02-25 NOTE — Progress Notes (Signed)
Remote pacemaker transmission.   

## 2019-04-15 DIAGNOSIS — Z8546 Personal history of malignant neoplasm of prostate: Secondary | ICD-10-CM | POA: Diagnosis not present

## 2019-04-15 DIAGNOSIS — Z950 Presence of cardiac pacemaker: Secondary | ICD-10-CM | POA: Diagnosis not present

## 2019-04-15 DIAGNOSIS — Z79899 Other long term (current) drug therapy: Secondary | ICD-10-CM | POA: Diagnosis not present

## 2019-04-15 DIAGNOSIS — R1310 Dysphagia, unspecified: Secondary | ICD-10-CM | POA: Diagnosis not present

## 2019-04-15 DIAGNOSIS — G7000 Myasthenia gravis without (acute) exacerbation: Secondary | ICD-10-CM | POA: Diagnosis not present

## 2019-04-21 DIAGNOSIS — Z01818 Encounter for other preprocedural examination: Secondary | ICD-10-CM | POA: Diagnosis not present

## 2019-04-21 DIAGNOSIS — H2511 Age-related nuclear cataract, right eye: Secondary | ICD-10-CM | POA: Diagnosis not present

## 2019-04-21 DIAGNOSIS — H25811 Combined forms of age-related cataract, right eye: Secondary | ICD-10-CM | POA: Diagnosis not present

## 2019-05-12 DIAGNOSIS — Z961 Presence of intraocular lens: Secondary | ICD-10-CM | POA: Diagnosis not present

## 2019-05-19 ENCOUNTER — Ambulatory Visit (INDEPENDENT_AMBULATORY_CARE_PROVIDER_SITE_OTHER): Payer: Medicare Other | Admitting: *Deleted

## 2019-05-19 DIAGNOSIS — I442 Atrioventricular block, complete: Secondary | ICD-10-CM

## 2019-05-19 LAB — CUP PACEART REMOTE DEVICE CHECK
Battery Remaining Longevity: 93 mo
Battery Remaining Percentage: 95.5 %
Battery Voltage: 2.99 V
Brady Statistic AP VP Percent: 18 %
Brady Statistic AP VS Percent: 1 %
Brady Statistic AS VP Percent: 82 %
Brady Statistic AS VS Percent: 1 %
Brady Statistic RA Percent Paced: 18 %
Brady Statistic RV Percent Paced: 99 %
Date Time Interrogation Session: 20200617065254
Implantable Lead Implant Date: 20190904
Implantable Lead Implant Date: 20190904
Implantable Lead Location: 753859
Implantable Lead Location: 753860
Implantable Lead Model: 3830
Implantable Lead Model: 5076
Implantable Pulse Generator Implant Date: 20190904
Lead Channel Impedance Value: 480 Ohm
Lead Channel Impedance Value: 560 Ohm
Lead Channel Pacing Threshold Amplitude: 0.5 V
Lead Channel Pacing Threshold Amplitude: 1.25 V
Lead Channel Pacing Threshold Pulse Width: 0.5 ms
Lead Channel Pacing Threshold Pulse Width: 1 ms
Lead Channel Sensing Intrinsic Amplitude: 3.6 mV
Lead Channel Sensing Intrinsic Amplitude: 5 mV
Lead Channel Setting Pacing Amplitude: 2 V
Lead Channel Setting Pacing Amplitude: 2.5 V
Lead Channel Setting Pacing Pulse Width: 1 ms
Lead Channel Setting Sensing Sensitivity: 2 mV
Pulse Gen Model: 2272
Pulse Gen Serial Number: 9058775

## 2019-06-01 DIAGNOSIS — N538 Other male sexual dysfunction: Secondary | ICD-10-CM | POA: Diagnosis not present

## 2019-06-01 DIAGNOSIS — C61 Malignant neoplasm of prostate: Secondary | ICD-10-CM | POA: Diagnosis not present

## 2019-06-01 DIAGNOSIS — N3020 Other chronic cystitis without hematuria: Secondary | ICD-10-CM | POA: Diagnosis not present

## 2019-06-02 ENCOUNTER — Encounter: Payer: Self-pay | Admitting: Cardiology

## 2019-06-02 NOTE — Progress Notes (Signed)
Remote pacemaker transmission.   

## 2019-07-28 ENCOUNTER — Other Ambulatory Visit: Payer: Self-pay | Admitting: Orthopedic Surgery

## 2019-07-28 DIAGNOSIS — M19012 Primary osteoarthritis, left shoulder: Secondary | ICD-10-CM | POA: Diagnosis not present

## 2019-08-11 ENCOUNTER — Ambulatory Visit
Admission: RE | Admit: 2019-08-11 | Discharge: 2019-08-11 | Disposition: A | Discharge status: Home / Self Care | Payer: Medicare Other | Source: Ambulatory Visit | Attending: Orthopedic Surgery | Admitting: Orthopedic Surgery

## 2019-08-11 DIAGNOSIS — M19012 Primary osteoarthritis, left shoulder: Secondary | ICD-10-CM | POA: Diagnosis not present

## 2019-08-18 ENCOUNTER — Ambulatory Visit (INDEPENDENT_AMBULATORY_CARE_PROVIDER_SITE_OTHER): Payer: Medicare Other | Admitting: *Deleted

## 2019-08-18 DIAGNOSIS — I442 Atrioventricular block, complete: Secondary | ICD-10-CM

## 2019-08-18 LAB — CUP PACEART REMOTE DEVICE CHECK
Battery Remaining Longevity: 91 mo
Battery Remaining Percentage: 95.5 %
Battery Voltage: 2.99 V
Brady Statistic AP VP Percent: 21 %
Brady Statistic AP VS Percent: 1 %
Brady Statistic AS VP Percent: 79 %
Brady Statistic AS VS Percent: 1 %
Brady Statistic RA Percent Paced: 21 %
Brady Statistic RV Percent Paced: 99 %
Date Time Interrogation Session: 20200916060012
Implantable Lead Implant Date: 20190904
Implantable Lead Implant Date: 20190904
Implantable Lead Location: 753859
Implantable Lead Location: 753860
Implantable Lead Model: 3830
Implantable Lead Model: 5076
Implantable Pulse Generator Implant Date: 20190904
Lead Channel Impedance Value: 440 Ohm
Lead Channel Impedance Value: 510 Ohm
Lead Channel Pacing Threshold Amplitude: 0.5 V
Lead Channel Pacing Threshold Amplitude: 1.25 V
Lead Channel Pacing Threshold Pulse Width: 0.5 ms
Lead Channel Pacing Threshold Pulse Width: 1 ms
Lead Channel Sensing Intrinsic Amplitude: 5 mV
Lead Channel Sensing Intrinsic Amplitude: 8.2 mV
Lead Channel Setting Pacing Amplitude: 2 V
Lead Channel Setting Pacing Amplitude: 2.5 V
Lead Channel Setting Pacing Pulse Width: 1 ms
Lead Channel Setting Sensing Sensitivity: 2 mV
Pulse Gen Model: 2272
Pulse Gen Serial Number: 9058775

## 2019-08-19 DIAGNOSIS — M19012 Primary osteoarthritis, left shoulder: Secondary | ICD-10-CM | POA: Diagnosis not present

## 2019-08-23 DIAGNOSIS — M19012 Primary osteoarthritis, left shoulder: Secondary | ICD-10-CM | POA: Diagnosis not present

## 2019-08-23 DIAGNOSIS — Z01818 Encounter for other preprocedural examination: Secondary | ICD-10-CM | POA: Diagnosis not present

## 2019-08-23 DIAGNOSIS — E8881 Metabolic syndrome: Secondary | ICD-10-CM | POA: Diagnosis not present

## 2019-08-23 DIAGNOSIS — Z23 Encounter for immunization: Secondary | ICD-10-CM | POA: Diagnosis not present

## 2019-08-23 DIAGNOSIS — E785 Hyperlipidemia, unspecified: Secondary | ICD-10-CM | POA: Diagnosis not present

## 2019-08-23 DIAGNOSIS — Z7901 Long term (current) use of anticoagulants: Secondary | ICD-10-CM | POA: Diagnosis not present

## 2019-08-23 DIAGNOSIS — Z79899 Other long term (current) drug therapy: Secondary | ICD-10-CM | POA: Diagnosis not present

## 2019-08-24 ENCOUNTER — Encounter: Payer: Self-pay | Admitting: Cardiology

## 2019-08-24 NOTE — Progress Notes (Signed)
Remote pacemaker transmission.   

## 2019-08-25 NOTE — Progress Notes (Signed)
Please place surgery orders. Pt scheduled for PAT appt on 08-27-19.

## 2019-08-25 NOTE — Patient Instructions (Addendum)
DUE TO COVID-19 ONLY ONE VISITOR IS ALLOWED TO COME WITH YOU AND STAY IN THE WAITING ROOM ONLY DURING PRE OP AND PROCEDURE DAY OF SURGERY. THE 1 VISITOR MAY VISIT WITH YOU AFTER SURGERY IN YOUR PRIVATE ROOM DURING VISITING HOURS ONLY!  YOU NEED TO HAVE A COVID 19 TEST ON 08-31-19 @12  :00 PM, THIS TEST MUST BE DONE BEFORE SURGERY, COME  [place], [address] [place] , [address].  ([place]) ONCE YOUR COVID TEST IS COMPLETED, PLEASE BEGIN THE QUARANTINE INSTRUCTIONS AS OUTLINED IN YOUR HANDOUT.                HASCAL GARROD  08/25/2019   Your procedure is scheduled on: 09-03-19   Report to [facility] Main  Entrance    Report to Admitting at 7:30 AM     Call this number if you have problems the morning of surgery [phone]    Remember: Do not eat food or drink liquids :After Midnight. BRUSH YOUR TEETH MORNING OF SURGERY AND RINSE YOUR MOUTH OUT, NO CHEWING GUM CANDY OR MINTS.     Take these medicines the morning of surgery with A SIP OF WATER: [employer]                                You may not have any metal on your body including hair pins and              piercings     Do not wear jewelry, cologne, lotions, powders or deodorant              Men may shave face and neck.   Do not bring valuables to the hospital. [place].  Contacts, dentures or bridgework may not be worn into surgery.  Leave suitcase in the car. After surgery it may be brought to your room.   Special Instructions: N/A              Please read over the following fact sheets you were given: _____________________________________________________________________             [facility] - Preparing for Surgery Before surgery, you can play an important role.  Because skin is not sterile, your skin needs to be as free of germs as possible.  You can reduce the number of germs on your skin by washing with CHG (chlorahexidine gluconate)  soap before surgery.  CHG is an antiseptic cleaner which kills germs and bonds with the skin to continue killing germs even after washing. Please DO NOT use if you have an allergy to CHG or antibacterial soaps.  If your skin becomes reddened/irritated stop using the CHG and inform your nurse when you arrive at Short Stay. Do not shave (including legs and underarms) for at least 48 hours prior to the first CHG shower.  You may shave your face/neck. Please follow these instructions carefully:  1.  Shower with CHG Soap the night before surgery and the  morning of Surgery.  2.  If you choose to wash your hair, wash your hair first as usual with your  normal  shampoo.  3.  After you shampoo, rinse your hair and body thoroughly to remove the  shampoo.  4.  Use CHG as you would any other liquid soap.  You can apply chg directly  to the skin and wash                       Gently with a scrungie or clean washcloth.  5.  Apply the CHG Soap to your body ONLY FROM THE NECK DOWN.   Do not use on face/ open                           Wound or open sores. Avoid contact with eyes, ears mouth and genitals (private parts).                       Wash face,  Genitals (private parts) with your normal soap.             6.  Wash thoroughly, paying special attention to the area where your surgery  will be performed.  7.  Thoroughly rinse your body with warm water from the neck down.  8.  DO NOT shower/wash with your normal soap after using and rinsing off  the CHG Soap.                9.  Pat yourself dry with a clean towel.            10.  Wear clean pajamas.            11.  Place clean sheets on your bed the night of your first shower and do not  sleep with pets. Day of Surgery : Do not apply any lotions/deodorants the morning of surgery.  Please wear clean clothes to the hospital/surgery center.  FAILURE TO FOLLOW THESE INSTRUCTIONS MAY RESULT IN THE CANCELLATION OF YOUR SURGERY PATIENT  SIGNATURE_________________________________  NURSE SIGNATURE__________________________________  ________________________________________________________________________

## 2019-08-25 NOTE — Progress Notes (Signed)
PCP - Dr. Christa See, [place], [place]  Cardiologist - Cecilie Kicks, [place] (See 08-30-19 Telephone encounter for Clearance)  Surgical Clearance on 9-21-2 Dr. Venetia Maxon  Chest x-ray -   EKG - 08-23-19  Stress Test -  ECHO -  Cardiac Cath -   Sleep Study -  CPAP -   Fasting Blood Sugar -  Checks Blood Sugar _____ times a day  Blood Thinner Instructions: Aspirin Instructions: Last Dose:  Anesthesia review: Hx of Myasthensia Graves  Patient denies shortness of breath, fever, cough and chest pain at PAT appointment   Patient verbalized understanding of instructions that were given to them at the PAT appointment. Patient was also instructed that they will need to review over the PAT instructions again at home before surgery.

## 2019-08-27 ENCOUNTER — Other Ambulatory Visit: Payer: Self-pay

## 2019-08-27 ENCOUNTER — Encounter (HOSPITAL_COMMUNITY): Payer: Self-pay

## 2019-08-27 ENCOUNTER — Encounter (HOSPITAL_COMMUNITY)
Admission: RE | Admit: 2019-08-27 | Discharge: 2019-08-27 | Disposition: A | Discharge status: Home / Self Care | Payer: Medicare Other | Source: Ambulatory Visit | Attending: Orthopedic Surgery | Admitting: Orthopedic Surgery

## 2019-08-27 DIAGNOSIS — Z01812 Encounter for preprocedural laboratory examination: Secondary | ICD-10-CM | POA: Insufficient documentation

## 2019-08-27 DIAGNOSIS — M19012 Primary osteoarthritis, left shoulder: Secondary | ICD-10-CM | POA: Diagnosis not present

## 2019-08-27 HISTORY — DX: Presence of cardiac pacemaker: Z95.0

## 2019-08-27 LAB — BASIC METABOLIC PANEL
Anion gap: 8 (ref 5–15)
BUN: 14 mg/dL (ref 8–23)
CO2: 25 mmol/L (ref 22–32)
Calcium: 9.2 mg/dL (ref 8.9–10.3)
Chloride: 107 mmol/L (ref 98–111)
Creatinine, Ser: 0.91 mg/dL (ref 0.61–1.24)
GFR calc Af Amer: >60 mL/min (ref >60)
GFR calc non Af Amer: >60 mL/min (ref >60)
Glucose, Bld: 104 mg/dL — ABNORMAL HIGH (ref 70–99)
Potassium: 4.4 mmol/L (ref 3.5–5.1)
Sodium: 140 mmol/L (ref 135–145)

## 2019-08-27 LAB — CBC
HCT: 44.9 % (ref 39.0–52.0)
Hemoglobin: 14.7 g/dL (ref 13.0–17.0)
MCH: 30.6 pg (ref 26.0–34.0)
MCHC: 32.7 g/dL (ref 30.0–36.0)
MCV: 93.3 fL (ref 80.0–100.0)
Platelets: 175 10*3/uL (ref 150–400)
RBC: 4.81 MIL/uL (ref 4.22–5.81)
RDW: 12.5 % (ref 11.5–15.5)
WBC: 7.3 10*3/uL (ref 4.0–10.5)
nRBC: 0 % (ref 0.0–0.2)

## 2019-08-27 LAB — SURGICAL PCR SCREEN
MRSA, PCR: NEGATIVE
Staphylococcus aureus: NEGATIVE

## 2019-08-30 ENCOUNTER — Telehealth: Payer: Self-pay | Admitting: *Deleted

## 2019-08-30 NOTE — Anesthesia Preprocedure Evaluation (Addendum)
Anesthesia Evaluation  Patient identified by MRN, date of birth, ID band Patient awake    Reviewed: Allergy & Precautions, NPO status , Patient's Chart, lab work & pertinent test results  History of Anesthesia Complications (+) history of anesthetic complications  Airway Mallampati: II  TM Distance: >3 FB Neck ROM: Full    Dental  (+) Dental Advisory Given   Pulmonary neg shortness of breath, neg recent URI,    breath sounds clear to auscultation       Cardiovascular + dysrhythmias + pacemaker  Rhythm:Regular     Neuro/Psych  Neuromuscular disease    GI/Hepatic Neg liver ROS, GERD  ,  Endo/Other  negative endocrine ROS  Renal/GU negative Renal ROS     Musculoskeletal  (+) Arthritis ,   Abdominal   Peds  Hematology negative hematology ROS (+)   Anesthesia Other Findings :73 y.o. never smoker with h/o GERD, Myasthenia Gravis, CHB s/p pacemaker (last device check 08/18/2019), prostate cancer 2019, left shoulder OA scheduled for above procedure 09/03/2019 with Dr. Netta Cedars.   Cardiac clearance received 08/30/2019.  Per Fabian Sharp, PA-C, "Estil Daft last seen on 12/17/19by Dr. Caryl Comes. Since that day, KAYRON REMINGTON done well.He can complete more than 4.0 METS. He does not have a history of MI or stroke. OK to hold ASA 5-7 days prior to procedure.Therefore, based on ACC/AHA guidelines, the patient would be at acceptable risk for the planned procedure without further cardiovascular testing."  Pt last seen by neurologst, Dr. Duke Salvia, 04/15/2019.  Per OV note myasthenia stable at this visit, on 1000mg  CellCept BID.    Reproductive/Obstetrics                            Anesthesia Physical Anesthesia Plan  ASA: III  Anesthesia Plan: General and Regional   Post-op Pain Management:  Regional for Post-op pain   Induction: Intravenous  PONV Risk Score and Plan: 2 and Ondansetron  and Dexamethasone  Airway Management Planned: Oral ETT  Additional Equipment: None  Intra-op Plan:   Post-operative Plan: Extubation in OR  Informed Consent: I have reviewed the patients History and Physical, chart, labs and discussed the procedure including the risks, benefits and alternatives for the proposed anesthesia with the patient or authorized representative who has indicated his/her understanding and acceptance.     Dental advisory given  Plan Discussed with: CRNA and Surgeon  Anesthesia Plan Comments: (See PAT note 08/27/2019, Konrad Felix, PA-C)       Anesthesia Quick Evaluation

## 2019-08-30 NOTE — Telephone Encounter (Signed)
   [facility] Pre-operative Risk Assessment    Request for surgical clearance:  1. What type of surgery is being performed? ANATOMIC TOTAL LEFT SHOULDER    2. When is this surgery scheduled? 09/03/19   3. What type of clearance is required (medical clearance vs. Pharmacy clearance to hold med vs. Both)? MEDICAL  4. Are there any medications that need to be held prior to surgery and how long? ASA   5. Practice name and name of physician performing surgery? [facility]; DR. Remo Lipps NORRIS   6. What is your office phone number [phone]    7.   What is your office fax number [phone]  8.   Anesthesia type (None, local, MAC, general) ? GENERAL   Julaine Hua 08/30/2019, 12:39 PM  _________________________________________________________________   (provider comments below)

## 2019-08-30 NOTE — Telephone Encounter (Signed)
   Primary Cardiologist: Virl Axe, MD  Chart reviewed as part of pre-operative protocol coverage. Patient was contacted 08/30/2019 in reference to pre-operative risk assessment for pending surgery as outlined below.  Kyle Bowen was last seen on 11/17/18 by Dr. Caryl Comes.  Since that day, Kyle Bowen has done well. He can complete more than 4.0 METS. He does not have a history of MI or stroke. OK to hold ASA 5-7 days prior to procedure.   Therefore, based on ACC/AHA guidelines, the patient would be at acceptable risk for the planned procedure without further cardiovascular testing.   I will route this recommendation to the requesting party via Epic fax function and remove from pre-op pool.  Please call with questions.  Kyle Lin [name], PA 08/30/2019, 2:48 PM

## 2019-08-30 NOTE — Progress Notes (Signed)
Anesthesia Chart Review   Case: [id number] Date/Time: 09/03/19 0943   Procedure: Anatomic TOTAL SHOULDER ARTHROPLASTY (Left Shoulder) - with interscalene block   Anesthesia type: General   Pre-op diagnosis: Osteoarthritis left shoulder   Location: [facility] ROOM 06 / [facility]   Surgeon: Netta Cedars, MD      DISCUSSION:73 y.o. never smoker with h/o GERD, Myasthenia Gravis, CHB s/p pacemaker (last device check 08/18/2019), prostate cancer 2019, left shoulder OA scheduled for above procedure 09/03/2019 with Dr. Netta Cedars.   Cardiac clearance received 08/30/2019.  Per Fabian Sharp, PA-C, "Kyle Bowen was last seen on 11/17/18 by Dr. Caryl Comes.  Since that day, Kyle Bowen has done well. He can complete more than 4.0 METS. He does not have a history of MI or stroke. OK to hold ASA 5-7 days prior to procedure. Therefore, based on ACC/AHA guidelines, the patient would be at acceptable risk for the planned procedure without further cardiovascular testing."  Pt last seen by neurologst, Dr. Duke Salvia, 04/15/2019.  Per OV note myasthenia stable at this visit, on 1000mg  CellCept BID.    Anticipate pt can proceed with planned procedure barring acute status change.   VS: BP (!) 155/90   Pulse 71   Temp 36.7 C (Oral)   Resp 16   Ht 5\' 7"  (1.702 m)   Wt 76.2 kg   SpO2 99%   BMI 26.31 kg/m   PROVIDERS: Street, Sharon Mt, MD is PCP   Virl Axe, MD is Cardiologist   Kathlene Cote, MD is Neurologist  LABS: Labs reviewed: Acceptable for surgery. (all labs ordered are listed, but only abnormal results are displayed)  Labs Reviewed  BASIC METABOLIC PANEL - Abnormal; Notable for the following components:      Result Value   Glucose, Bld 104 (*)    All other components within normal limits  SURGICAL PCR SCREEN  CBC     IMAGES:   EKG: 11/17/2018 Rate 78 bpm Electronic ventricular pacemaker   CV:  Past Medical History:  Diagnosis Date  . Arthritis   . Cancer ([place]) 89    prostate   . Complete heart block ([place]) 07/31/2018  . Complication of anesthesia    occ bp drops after surgery whrn getting up   . GERD (gastroesophageal reflux disease)    occ  . Heart block   . History of myasthenia gravis   . History of prostate cancer 08/01/2018  . Myasthenia ([place])   . Myasthenia gravis without (acute) exacerbation ([place]) 03/06/2016  . Presence of permanent cardiac pacemaker   . S/P shoulder replacement 10/14/2014    Past Surgical History:  Procedure Laterality Date  . CATARACT EXTRACTION    . HERNIA REPAIR Left 05   inguinal  . JOINT REPLACEMENT Left 2016   Surgery was in [place]  . PACEMAKER IMPLANT N/A 08/05/2018   Procedure: PACEMAKER IMPLANT - Dual Chamber;  Surgeon: Deboraha Sprang, MD;  Location: [facility];  Service: Cardiovascular;  Laterality: N/A;  . PENILE PROSTHESIS IMPLANT  93   00.08 replaced  . PROSTATE SURGERY  89   ca  . TONSILLECTOMY    . TOTAL SHOULDER ARTHROPLASTY Right 10/14/2014   Procedure: RIGHT TOTAL SHOULDER ARTHROPLASTY;  Surgeon: Augustin Schooling, MD;  Location: [facility];  Service: Orthopedics;  Laterality: Right;    MEDICATIONS: . aspirin 81 MG tablet  . meloxicam (MOBIC) 15 MG tablet  . mycophenolate (CELLCEPT) 500 MG tablet  . pyridostigmine (MESTINON) 60 MG tablet  .  simvastatin (ZOCOR) 20 MG tablet   No current facility-administered medications for this encounter.      [name] Pre-Surgical Testing [phone] 08/30/19  3:38 PM

## 2019-08-31 ENCOUNTER — Other Ambulatory Visit (HOSPITAL_COMMUNITY)
Admission: RE | Admit: 2019-08-31 | Discharge: 2019-08-31 | Disposition: A | Discharge status: Home / Self Care | Payer: Medicare Other | Source: Ambulatory Visit | Attending: Orthopedic Surgery | Admitting: Orthopedic Surgery

## 2019-08-31 DIAGNOSIS — I442 Atrioventricular block, complete: Secondary | ICD-10-CM | POA: Diagnosis not present

## 2019-08-31 DIAGNOSIS — Z20828 Contact with and (suspected) exposure to other viral communicable diseases: Secondary | ICD-10-CM | POA: Diagnosis not present

## 2019-08-31 DIAGNOSIS — M19012 Primary osteoarthritis, left shoulder: Secondary | ICD-10-CM | POA: Diagnosis not present

## 2019-08-31 DIAGNOSIS — Z96612 Presence of left artificial shoulder joint: Secondary | ICD-10-CM | POA: Diagnosis not present

## 2019-08-31 DIAGNOSIS — K219 Gastro-esophageal reflux disease without esophagitis: Secondary | ICD-10-CM | POA: Diagnosis not present

## 2019-08-31 DIAGNOSIS — G8918 Other acute postprocedural pain: Secondary | ICD-10-CM | POA: Diagnosis not present

## 2019-08-31 DIAGNOSIS — Z791 Long term (current) use of non-steroidal anti-inflammatories (NSAID): Secondary | ICD-10-CM | POA: Diagnosis not present

## 2019-08-31 DIAGNOSIS — Z888 Allergy status to other drugs, medicaments and biological substances status: Secondary | ICD-10-CM | POA: Diagnosis not present

## 2019-08-31 DIAGNOSIS — Z0000 Encounter for general adult medical examination without abnormal findings: Secondary | ICD-10-CM | POA: Diagnosis not present

## 2019-08-31 DIAGNOSIS — M170 Bilateral primary osteoarthritis of knee: Secondary | ICD-10-CM | POA: Diagnosis not present

## 2019-08-31 DIAGNOSIS — Z471 Aftercare following joint replacement surgery: Secondary | ICD-10-CM | POA: Diagnosis not present

## 2019-08-31 DIAGNOSIS — Z8546 Personal history of malignant neoplasm of prostate: Secondary | ICD-10-CM | POA: Diagnosis not present

## 2019-08-31 DIAGNOSIS — Z950 Presence of cardiac pacemaker: Secondary | ICD-10-CM | POA: Diagnosis not present

## 2019-08-31 DIAGNOSIS — G7000 Myasthenia gravis without (acute) exacerbation: Secondary | ICD-10-CM | POA: Diagnosis not present

## 2019-08-31 DIAGNOSIS — Z96611 Presence of right artificial shoulder joint: Secondary | ICD-10-CM | POA: Diagnosis not present

## 2019-08-31 DIAGNOSIS — E785 Hyperlipidemia, unspecified: Secondary | ICD-10-CM | POA: Diagnosis not present

## 2019-08-31 DIAGNOSIS — Z881 Allergy status to other antibiotic agents status: Secondary | ICD-10-CM | POA: Diagnosis not present

## 2019-08-31 DIAGNOSIS — Z7982 Long term (current) use of aspirin: Secondary | ICD-10-CM | POA: Diagnosis not present

## 2019-08-31 DIAGNOSIS — Z79899 Other long term (current) drug therapy: Secondary | ICD-10-CM | POA: Diagnosis not present

## 2019-08-31 DIAGNOSIS — Z91041 Radiographic dye allergy status: Secondary | ICD-10-CM | POA: Diagnosis not present

## 2019-09-01 LAB — NOVEL CORONAVIRUS, NAA (HOSP ORDER, SEND-OUT TO REF LAB; TAT 18-24 HRS): SARS-CoV-2, NAA: NOT DETECTED

## 2019-09-02 NOTE — Progress Notes (Signed)
Called Dr Norris's office requesting orders for 09/03/2019 surgery.

## 2019-09-02 NOTE — H&P (Signed)
Patient's anticipated LOS is less than 2 midnights, meeting these requirements: - Younger than 22 - Lives within 1 hour of care - Has a competent adult at home to recover with post-op recover - NO history of  - Chronic pain requiring opiods  - Diabetes  - Coronary Artery Disease  - Heart failure  - Heart attack  - Stroke  - DVT/VTE  - Cardiac arrhythmia  - Respiratory Failure/COPD  - Renal failure  - Anemia  - Advanced Liver disease       Kyle Bowen is an 73 y.o. male.    Chief Complaint: left shoulder pain  HPI: Pt is a 73 y.o. male complaining of left shoulder pain for multiple years. Pain had continually increased since the beginning. X-rays in the clinic show end-stage arthritic changes of the left shoulder. Pt has tried various conservative treatments which have failed to alleviate their symptoms, including injections and therapy. Various options are discussed with the patient. Risks, benefits and expectations were discussed with the patient. Patient understand the risks, benefits and expectations and wishes to proceed with surgery.   PCP:  Street, Sharon Mt, MD  D/C Plans: Home  PMH: Past Medical History:  Diagnosis Date  . Arthritis   . Cancer ([place]) 91   prostate   . Complete heart block ([place]) 07/31/2018  . Complication of anesthesia    occ bp drops after surgery whrn getting up   . GERD (gastroesophageal reflux disease)    occ  . Heart block   . History of myasthenia gravis   . History of prostate cancer 08/01/2018  . Myasthenia ([place])   . Myasthenia gravis without (acute) exacerbation ([place]) 03/06/2016  . Presence of permanent cardiac pacemaker   . S/P shoulder replacement 10/14/2014    PSH: Past Surgical History:  Procedure Laterality Date  . CATARACT EXTRACTION    . HERNIA REPAIR Left 05   inguinal  . JOINT REPLACEMENT Left 2016   Surgery was in [place]  . PACEMAKER IMPLANT N/A 08/05/2018   Procedure: PACEMAKER IMPLANT - Dual Chamber;   Surgeon: Deboraha Sprang, MD;  Location: [facility];  Service: Cardiovascular;  Laterality: N/A;  . PENILE PROSTHESIS IMPLANT  93   00.08 replaced  . PROSTATE SURGERY  91   ca  . TONSILLECTOMY    . TOTAL SHOULDER ARTHROPLASTY Right 10/14/2014   Procedure: RIGHT TOTAL SHOULDER ARTHROPLASTY;  Surgeon: Augustin Schooling, MD;  Location: [facility];  Service: Orthopedics;  Laterality: Right;    Social History:  reports that he has never smoked. He has never used smokeless tobacco. He reports current alcohol use. He reports that he does not use drugs.  Allergies:  Allergies  Allergen Reactions  . Aluminum-Containing Compounds Other (See Comments)    Myasthenic patient   . Aminoglycosides Other (See Comments)    Myasthenic patient   . Azithromycin Other (See Comments)    Myasthenic patient   . Botulinum Toxins Other (See Comments)    Myasthenic patient   . Calcium Channel Blockers Other (See Comments)    Myasthenic patient   . Ciprofloxacin Other (See Comments)    Myasthenic patient   . Colistin Other (See Comments)    Myasthenic patient   . D Tubocurarine [Tubocurarine] Other (See Comments)    Myasthenic patient   . Erythromycin Base Other (See Comments)    Myasthenic patient   . Gentamicin Other (See Comments)    Myasthenic patient   . Iodinated Diagnostic Agents Other (  See Comments)    Myasthenic patient   . Kanamycin Other (See Comments)    Myasthenic patient   . Macrolides And Ketolides Other (See Comments)    Myasthenic patient   . Magnesium-Containing Compounds Other (See Comments)    Myasthenic patient   . Moxifloxacin Other (See Comments)    Myasthenic patient   . Neomycin Other (See Comments)    Myasthenic patient   . Norfloxacin Other (See Comments)    Myasthenic patient   . Ofloxacin Other (See Comments)    Myasthenic patient   . Pefloxacin Other (See Comments)    Myasthenic patient   . Penicillamine Other (See Comments)    Myasthenic patient   .  Procainamide Other (See Comments)    Myasthenic patient   . Propranolol Other (See Comments)    Myasthenic patient   . Quinidine Other (See Comments)    Myasthenic patient   . Quinine Derivatives Other (See Comments)    Myasthenic patient   . Simethicone Other (See Comments)    Myasthenic patient   . Streptomycin Other (See Comments)    Myasthenic patient   . Succinylcholine Other (See Comments)    Myasthenic patient   . Telithromycin Other (See Comments)    Myasthenic patient   . Timolol Other (See Comments)    Myasthenic patient   . Tobramycin Other (See Comments)    Myasthenic patient   . Vecuronium Other (See Comments)    Myasthenic patient     Medications: No current facility-administered medications for this encounter.    Current Outpatient Medications  Medication Sig Dispense Refill  . aspirin 81 MG tablet Take 81 mg by mouth daily.    . meloxicam (MOBIC) 15 MG tablet Take 15 mg by mouth daily as needed for pain.     . mycophenolate (CELLCEPT) 500 MG tablet Take 1,000 mg by mouth 2 (two) times daily.    Marland Kitchen pyridostigmine (MESTINON) 60 MG tablet Take 60 mg by mouth daily as needed (difficulty with tongue).     . simvastatin (ZOCOR) 20 MG tablet Take 20 mg by mouth daily.       No results found for this or any previous visit (from the past 48 hour(s)). No results found.  ROS: Pain with rom of the left upper extremity  Physical Exam: Alert and oriented 73 y.o. male in no acute distress Cranial nerves 2-12 intact Cervical spine: full rom with no tenderness, nv intact distally Chest: active breath sounds bilaterally, no wheeze rhonchi or rales Heart: regular rate and rhythm, no murmur Abd: non tender non distended with active bowel sounds Hip is stable with rom  Left shoulder painful rom due to crepitus nv intact distally No rashes or edema distally  Assessment/Plan Assessment: left shoulder end stage osteoarthritis  Plan:  Patient will undergo a left total  shoulder by Dr. Veverly Fells at [facility]. Risks benefits and expectations were discussed with the patient. Patient understand risks, benefits and expectations and wishes to proceed. Preoperative templating of the joint replacement has been completed, documented, and submitted to the Operating Room personnel in order to optimize intra-operative equipment management.   Merla Riches PA-C, MPAS [facility] is now [facility] [address] [place], [place], [place] [address] Phone: [phone] [email or web address] Facebook  [employer]

## 2019-09-03 ENCOUNTER — Inpatient Hospital Stay (HOSPITAL_COMMUNITY): Payer: Medicare Other | Admitting: Physician Assistant

## 2019-09-03 ENCOUNTER — Inpatient Hospital Stay (HOSPITAL_COMMUNITY): Payer: Medicare Other

## 2019-09-03 ENCOUNTER — Encounter (HOSPITAL_COMMUNITY): Payer: Self-pay

## 2019-09-03 ENCOUNTER — Other Ambulatory Visit: Payer: Self-pay

## 2019-09-03 ENCOUNTER — Inpatient Hospital Stay (HOSPITAL_COMMUNITY)
Admission: RE | Admit: 2019-09-03 | Discharge: 2019-09-04 | DRG: 483 | Disposition: A | Discharge status: Home / Self Care | Payer: Medicare Other | Source: Other Acute Inpatient Hospital | Attending: Orthopedic Surgery | Admitting: Orthopedic Surgery

## 2019-09-03 ENCOUNTER — Encounter (HOSPITAL_COMMUNITY)
Admission: RE | Disposition: A | Discharge status: Home / Self Care | Payer: Self-pay | Source: Other Acute Inpatient Hospital | Attending: Orthopedic Surgery

## 2019-09-03 ENCOUNTER — Inpatient Hospital Stay (HOSPITAL_COMMUNITY): Payer: Medicare Other | Admitting: Anesthesiology

## 2019-09-03 DIAGNOSIS — Z881 Allergy status to other antibiotic agents status: Secondary | ICD-10-CM | POA: Diagnosis not present

## 2019-09-03 DIAGNOSIS — Z791 Long term (current) use of non-steroidal anti-inflammatories (NSAID): Secondary | ICD-10-CM | POA: Diagnosis not present

## 2019-09-03 DIAGNOSIS — Z888 Allergy status to other drugs, medicaments and biological substances status: Secondary | ICD-10-CM | POA: Diagnosis not present

## 2019-09-03 DIAGNOSIS — Z8546 Personal history of malignant neoplasm of prostate: Secondary | ICD-10-CM

## 2019-09-03 DIAGNOSIS — Z950 Presence of cardiac pacemaker: Secondary | ICD-10-CM | POA: Diagnosis not present

## 2019-09-03 DIAGNOSIS — Z471 Aftercare following joint replacement surgery: Secondary | ICD-10-CM | POA: Diagnosis not present

## 2019-09-03 DIAGNOSIS — Z79899 Other long term (current) drug therapy: Secondary | ICD-10-CM

## 2019-09-03 DIAGNOSIS — Z20828 Contact with and (suspected) exposure to other viral communicable diseases: Secondary | ICD-10-CM | POA: Diagnosis present

## 2019-09-03 DIAGNOSIS — Z96612 Presence of left artificial shoulder joint: Secondary | ICD-10-CM | POA: Diagnosis not present

## 2019-09-03 DIAGNOSIS — Z96611 Presence of right artificial shoulder joint: Secondary | ICD-10-CM | POA: Diagnosis present

## 2019-09-03 DIAGNOSIS — I442 Atrioventricular block, complete: Secondary | ICD-10-CM | POA: Diagnosis present

## 2019-09-03 DIAGNOSIS — M19012 Primary osteoarthritis, left shoulder: Secondary | ICD-10-CM | POA: Diagnosis present

## 2019-09-03 DIAGNOSIS — Z7982 Long term (current) use of aspirin: Secondary | ICD-10-CM

## 2019-09-03 DIAGNOSIS — Z91041 Radiographic dye allergy status: Secondary | ICD-10-CM

## 2019-09-03 DIAGNOSIS — G7000 Myasthenia gravis without (acute) exacerbation: Secondary | ICD-10-CM | POA: Diagnosis present

## 2019-09-03 DIAGNOSIS — G8918 Other acute postprocedural pain: Secondary | ICD-10-CM | POA: Diagnosis not present

## 2019-09-03 DIAGNOSIS — K219 Gastro-esophageal reflux disease without esophagitis: Secondary | ICD-10-CM | POA: Diagnosis present

## 2019-09-03 HISTORY — PX: TOTAL SHOULDER ARTHROPLASTY: SHX126

## 2019-09-03 SURGERY — ARTHROPLASTY, SHOULDER, TOTAL
Anesthesia: Regional | Site: Shoulder | Laterality: Left

## 2019-09-03 MED ORDER — LACTATED RINGERS IV SOLN
INTRAVENOUS | Status: DC
  Administered 2019-09-03 (×3): via INTRAVENOUS

## 2019-09-03 MED ORDER — FENTANYL CITRATE (PF) 100 MCG/2ML IJ SOLN
50.0000 ug | Freq: Once | INTRAMUSCULAR | Status: AC
  Administered 2019-09-03: 10:00:00 100 ug via INTRAVENOUS

## 2019-09-03 MED ORDER — STERILE WATER FOR IRRIGATION IR SOLN
Status: DC | PRN
  Administered 2019-09-03 (×2): 1000 mL

## 2019-09-03 MED ORDER — MIDAZOLAM HCL 2 MG/2ML IJ SOLN
1.0000 mg | Freq: Once | INTRAMUSCULAR | Status: AC
  Administered 2019-09-03: 10:00:00 2 mg via INTRAVENOUS

## 2019-09-03 MED ORDER — BUPIVACAINE-EPINEPHRINE (PF) 0.5% -1:200000 IJ SOLN
INTRAMUSCULAR | Status: DC | PRN
  Administered 2019-09-03: 10 mL

## 2019-09-03 MED ORDER — DEXAMETHASONE SODIUM PHOSPHATE 10 MG/ML IJ SOLN
INTRAMUSCULAR | Status: AC
  Filled 2019-09-03: qty 1

## 2019-09-03 MED ORDER — ACETAMINOPHEN 160 MG/5ML PO SOLN: 1000.0000 mg | Freq: Once | ORAL | Status: DC | PRN

## 2019-09-03 MED ORDER — ONDANSETRON HCL 4 MG PO TABS: 4.0000 mg | ORAL_TABLET | Freq: Four times a day (QID) | ORAL | Status: DC | PRN

## 2019-09-03 MED ORDER — SIMVASTATIN 20 MG PO TABS
20.0000 mg | ORAL_TABLET | Freq: Every day | ORAL | Status: DC
  Filled 2019-09-03: qty 1

## 2019-09-03 MED ORDER — DEXAMETHASONE SODIUM PHOSPHATE 10 MG/ML IJ SOLN
INTRAMUSCULAR | Status: DC | PRN
  Administered 2019-09-03: 8 mg via INTRAVENOUS

## 2019-09-03 MED ORDER — PHENYLEPHRINE 40 MCG/ML (10ML) SYRINGE FOR IV PUSH (FOR BLOOD PRESSURE SUPPORT)
PREFILLED_SYRINGE | INTRAVENOUS | Status: DC | PRN
  Administered 2019-09-03 (×2): 80 ug via INTRAVENOUS
  Administered 2019-09-03: 120 ug via INTRAVENOUS

## 2019-09-03 MED ORDER — MENTHOL 3 MG MT LOZG: 1.0000 | LOZENGE | OROMUCOSAL | Status: DC | PRN

## 2019-09-03 MED ORDER — OXYCODONE HCL 5 MG PO TABS: 5.0000 mg | ORAL_TABLET | Freq: Once | ORAL | Status: DC | PRN

## 2019-09-03 MED ORDER — METHOCARBAMOL 500 MG PO TABS: 500.0000 mg | ORAL_TABLET | Freq: Four times a day (QID) | ORAL | 1 refills | Status: DC | PRN

## 2019-09-03 MED ORDER — BUPIVACAINE LIPOSOME 1.3 % IJ SUSP
INTRAMUSCULAR | Status: DC | PRN
  Administered 2019-09-03: 133 mg via PERINEURAL

## 2019-09-03 MED ORDER — BUPIVACAINE-EPINEPHRINE 0.5% -1:200000 IJ SOLN
INTRAMUSCULAR | Status: AC
  Filled 2019-09-03: qty 1

## 2019-09-03 MED ORDER — FENTANYL CITRATE (PF) 100 MCG/2ML IJ SOLN
INTRAMUSCULAR | Status: AC
  Administered 2019-09-03: 10:00:00 100 ug via INTRAVENOUS
  Filled 2019-09-03: qty 2

## 2019-09-03 MED ORDER — BISACODYL 10 MG RE SUPP: 10.0000 mg | Freq: Every day | RECTAL | Status: DC | PRN

## 2019-09-03 MED ORDER — METHOCARBAMOL 500 MG PO TABS: 500.0000 mg | ORAL_TABLET | Freq: Four times a day (QID) | ORAL | Status: DC | PRN

## 2019-09-03 MED ORDER — ONDANSETRON HCL 4 MG/2ML IJ SOLN
INTRAMUSCULAR | Status: DC | PRN
  Administered 2019-09-03: 4 mg via INTRAVENOUS

## 2019-09-03 MED ORDER — METHOCARBAMOL 1000 MG/10ML IJ SOLN
500.0000 mg | Freq: Four times a day (QID) | INTRAVENOUS | Status: DC | PRN
  Filled 2019-09-03: qty 5

## 2019-09-03 MED ORDER — SODIUM CHLORIDE 0.9 % IV SOLN
INTRAVENOUS | Status: DC | PRN
  Administered 2019-09-03: 11:00:00 50 ug/min via INTRAVENOUS

## 2019-09-03 MED ORDER — ACETAMINOPHEN 500 MG PO TABS: 1000.0000 mg | ORAL_TABLET | Freq: Once | ORAL | Status: DC | PRN

## 2019-09-03 MED ORDER — FENTANYL CITRATE (PF) 100 MCG/2ML IJ SOLN
INTRAMUSCULAR | Status: AC
  Filled 2019-09-03: qty 2

## 2019-09-03 MED ORDER — OXYCODONE HCL 5 MG/5ML PO SOLN: 5.0000 mg | Freq: Once | ORAL | Status: DC | PRN

## 2019-09-03 MED ORDER — ACETAMINOPHEN 10 MG/ML IV SOLN
1000.0000 mg | Freq: Once | INTRAVENOUS | Status: AC
  Administered 2019-09-03: 1000 mg via INTRAVENOUS
  Filled 2019-09-03: qty 100

## 2019-09-03 MED ORDER — PHENOL 1.4 % MT LIQD: 1.0000 | OROMUCOSAL | Status: DC | PRN

## 2019-09-03 MED ORDER — SODIUM CHLORIDE 0.9 % IR SOLN
Status: DC | PRN
  Administered 2019-09-03: 1000 mL

## 2019-09-03 MED ORDER — MELOXICAM 15 MG PO TABS
15.0000 mg | ORAL_TABLET | Freq: Every day | ORAL | Status: DC | PRN
  Filled 2019-09-03: qty 1

## 2019-09-03 MED ORDER — POLYETHYLENE GLYCOL 3350 17 G PO PACK: 17.0000 g | PACK | Freq: Every day | ORAL | Status: DC | PRN

## 2019-09-03 MED ORDER — PROPOFOL 10 MG/ML IV BOLUS
INTRAVENOUS | Status: AC
  Filled 2019-09-03: qty 20

## 2019-09-03 MED ORDER — THROMBIN (RECOMBINANT) 5000 UNITS EX SOLR
CUTANEOUS | Status: AC
  Filled 2019-09-03: qty 5000

## 2019-09-03 MED ORDER — CLINDAMYCIN PHOSPHATE 900 MG/50ML IV SOLN
900.0000 mg | INTRAVENOUS | Status: AC
  Administered 2019-09-03: 900 mg via INTRAVENOUS
  Filled 2019-09-03: qty 50

## 2019-09-03 MED ORDER — METOCLOPRAMIDE HCL 5 MG PO TABS
5.0000 mg | ORAL_TABLET | Freq: Three times a day (TID) | ORAL | Status: DC | PRN
  Administered 2019-09-03: 22:00:00 10 mg via ORAL
  Filled 2019-09-03: qty 2

## 2019-09-03 MED ORDER — METOCLOPRAMIDE HCL 5 MG/ML IJ SOLN: 5.0000 mg | Freq: Three times a day (TID) | INTRAMUSCULAR | Status: DC | PRN

## 2019-09-03 MED ORDER — CLINDAMYCIN PHOSPHATE 600 MG/50ML IV SOLN
600.0000 mg | Freq: Four times a day (QID) | INTRAVENOUS | Status: AC
  Administered 2019-09-03 – 2019-09-04 (×3): 600 mg via INTRAVENOUS
  Filled 2019-09-03 (×3): qty 50

## 2019-09-03 MED ORDER — OXYCODONE HCL 5 MG PO TABS: 5.0000 mg | ORAL_TABLET | ORAL | Status: DC | PRN

## 2019-09-03 MED ORDER — LIDOCAINE HCL (CARDIAC) PF 100 MG/5ML IV SOSY
PREFILLED_SYRINGE | INTRAVENOUS | Status: DC | PRN
  Administered 2019-09-03: 100 mg via INTRAVENOUS

## 2019-09-03 MED ORDER — HYDROMORPHONE HCL 1 MG/ML IJ SOLN: 0.5000 mg | INTRAMUSCULAR | Status: DC | PRN

## 2019-09-03 MED ORDER — PROPOFOL 10 MG/ML IV BOLUS
INTRAVENOUS | Status: DC | PRN
  Administered 2019-09-03: 160 mg via INTRAVENOUS
  Administered 2019-09-03: 40 mg via INTRAVENOUS

## 2019-09-03 MED ORDER — ONDANSETRON HCL 4 MG/2ML IJ SOLN
4.0000 mg | Freq: Four times a day (QID) | INTRAMUSCULAR | Status: DC | PRN
  Administered 2019-09-04: 4 mg via INTRAVENOUS
  Filled 2019-09-03: qty 2

## 2019-09-03 MED ORDER — ACETAMINOPHEN 325 MG PO TABS: 325.0000 mg | ORAL_TABLET | Freq: Four times a day (QID) | ORAL | Status: DC | PRN

## 2019-09-03 MED ORDER — PYRIDOSTIGMINE BROMIDE 60 MG PO TABS
60.0000 mg | ORAL_TABLET | Freq: Every day | ORAL | Status: DC | PRN
  Filled 2019-09-03: qty 1

## 2019-09-03 MED ORDER — FENTANYL CITRATE (PF) 100 MCG/2ML IJ SOLN
INTRAMUSCULAR | Status: DC | PRN
  Administered 2019-09-03: 100 ug via INTRAVENOUS

## 2019-09-03 MED ORDER — ASPIRIN EC 81 MG PO TBEC
81.0000 mg | DELAYED_RELEASE_TABLET | Freq: Every day | ORAL | Status: DC
  Administered 2019-09-04: 81 mg via ORAL
  Filled 2019-09-03: qty 1

## 2019-09-03 MED ORDER — MIDAZOLAM HCL 2 MG/2ML IJ SOLN
INTRAMUSCULAR | Status: AC
  Administered 2019-09-03: 10:00:00 2 mg via INTRAVENOUS
  Filled 2019-09-03: qty 2

## 2019-09-03 MED ORDER — SODIUM CHLORIDE 0.9 % IV SOLN
INTRAVENOUS | Status: DC
  Administered 2019-09-03: 15:00:00 via INTRAVENOUS

## 2019-09-03 MED ORDER — CHLORHEXIDINE GLUCONATE 4 % EX LIQD: 60.0000 mL | Freq: Once | CUTANEOUS | Status: DC

## 2019-09-03 MED ORDER — OXYCODONE-ACETAMINOPHEN 5-325 MG PO TABS: 1.0000 | ORAL_TABLET | ORAL | 0 refills | Status: DC | PRN

## 2019-09-03 MED ORDER — FENTANYL CITRATE (PF) 100 MCG/2ML IJ SOLN: 25.0000 ug | INTRAMUSCULAR | Status: DC | PRN

## 2019-09-03 MED ORDER — LIDOCAINE 2% (20 MG/ML) 5 ML SYRINGE
INTRAMUSCULAR | Status: AC
  Filled 2019-09-03: qty 5

## 2019-09-03 MED ORDER — PHENYLEPHRINE HCL (PRESSORS) 10 MG/ML IV SOLN
INTRAVENOUS | Status: AC
  Filled 2019-09-03: qty 1

## 2019-09-03 MED ORDER — ONDANSETRON HCL 4 MG/2ML IJ SOLN
INTRAMUSCULAR | Status: AC
  Filled 2019-09-03: qty 2

## 2019-09-03 MED ORDER — ACETAMINOPHEN 10 MG/ML IV SOLN: 1000.0000 mg | Freq: Once | INTRAVENOUS | Status: DC | PRN

## 2019-09-03 MED ORDER — MYCOPHENOLATE MOFETIL 250 MG PO CAPS
1000.0000 mg | ORAL_CAPSULE | Freq: Two times a day (BID) | ORAL | Status: DC
  Administered 2019-09-03 – 2019-09-04 (×2): 1000 mg via ORAL
  Filled 2019-09-03 (×2): qty 4

## 2019-09-03 MED ORDER — DOCUSATE SODIUM 100 MG PO CAPS
100.0000 mg | ORAL_CAPSULE | Freq: Two times a day (BID) | ORAL | Status: DC
  Administered 2019-09-03 – 2019-09-04 (×2): 100 mg via ORAL
  Filled 2019-09-03 (×2): qty 1

## 2019-09-03 MED ORDER — BUPIVACAINE HCL (PF) 0.5 % IJ SOLN
INTRAMUSCULAR | Status: DC | PRN
  Administered 2019-09-03: 15 mL via PERINEURAL

## 2019-09-03 SURGICAL SUPPLY — 75 items
BAG ZIPLOCK 12X15 (MISCELLANEOUS) IMPLANT
BIT DRILL 1.6MX128 (BIT) ×2 IMPLANT
BIT DRILL 1.6MX128MM (BIT) ×1
BIT DRILL 170X2.5X (BIT) IMPLANT
BIT DRL 170X2.5X (BIT) ×1
BLADE SAG 18X100X1.27 (BLADE) ×3 IMPLANT
CEMENT HV SMART SET (Cement) ×3 IMPLANT
CLOSURE WOUND 1/2 X4 (GAUZE/BANDAGES/DRESSINGS) ×1
COVER BACK TABLE 60X90IN (DRAPES) ×3 IMPLANT
COVER SURGICAL LIGHT HANDLE (MISCELLANEOUS) ×3 IMPLANT
COVER WAND RF STERILE (DRAPES) IMPLANT
CUP HUMERAL 42 PLUS 3 (Orthopedic Implant) ×2 IMPLANT
DECANTER SPIKE VIAL GLASS SM (MISCELLANEOUS) ×3 IMPLANT
DRAPE ORTHO SPLIT 77X108 STRL (DRAPES) ×4
DRAPE SHEET LG 3/4 BI-LAMINATE (DRAPES) ×3 IMPLANT
DRAPE SURG ORHT 6 SPLT 77X108 (DRAPES) ×2 IMPLANT
DRAPE U-SHAPE 47X51 STRL (DRAPES) ×3 IMPLANT
DRILL 2.5 (BIT) ×2
DRSG ADAPTIC 3X8 NADH LF (GAUZE/BANDAGES/DRESSINGS) ×3 IMPLANT
DRSG PAD ABDOMINAL 8X10 ST (GAUZE/BANDAGES/DRESSINGS) ×3 IMPLANT
DURAPREP 26ML APPLICATOR (WOUND CARE) ×3 IMPLANT
ELECT BLADE TIP CTD 4 INCH (ELECTRODE) ×3 IMPLANT
ELECT NDL TIP 2.8 STRL (NEEDLE) ×1 IMPLANT
ELECT NEEDLE TIP 2.8 STRL (NEEDLE) ×3 IMPLANT
ELECT REM PT RETURN 15FT ADLT (MISCELLANEOUS) ×3 IMPLANT
EPIPHSYSI CENT SZ 2 (Orthopedic Implant) ×3 IMPLANT
EPIPHYSIS CENT SZ 2 (Orthopedic Implant) IMPLANT
GAUZE SPONGE 4X4 12PLY STRL (GAUZE/BANDAGES/DRESSINGS) ×3 IMPLANT
GLENOSPHERE XTEND RSA 42 SD +4 (Joint) ×2 IMPLANT
GLOVE BIOGEL PI ORTHO PRO 7.5 (GLOVE) ×2
GLOVE BIOGEL PI ORTHO PRO SZ8 (GLOVE) ×2
GLOVE ORTHO TXT STRL SZ7.5 (GLOVE) ×3 IMPLANT
GLOVE PI ORTHO PRO STRL 7.5 (GLOVE) ×1 IMPLANT
GLOVE PI ORTHO PRO STRL SZ8 (GLOVE) ×1 IMPLANT
GLOVE SURG ORTHO 8.5 STRL (GLOVE) ×3 IMPLANT
GOWN STRL REUS W/TWL XL LVL3 (GOWN DISPOSABLE) ×6 IMPLANT
KIT BASIN OR (CUSTOM PROCEDURE TRAY) ×3 IMPLANT
KIT TURNOVER KIT A (KITS) IMPLANT
MANIFOLD NEPTUNE II (INSTRUMENTS) ×3 IMPLANT
METAGLENE DELTA EXTEND (Trauma) IMPLANT
METAGLENE DXTEND (Trauma) ×3 IMPLANT
MODULAR STEM 14 (Trauma) ×3 IMPLANT
NDL MAYO 6 CRC TAPER PT (NEEDLE) IMPLANT
NDL MAYO CATGUT SZ4 TPR NDL (NEEDLE) ×1 IMPLANT
NEEDLE MAYO 6 CRC TAPER PT (NEEDLE) ×3 IMPLANT
NEEDLE MAYO CATGUT SZ4 (NEEDLE) ×3 IMPLANT
PACK SHOULDER (CUSTOM PROCEDURE TRAY) ×3 IMPLANT
PASSER SUT SWANSON 36MM LOOP (INSTRUMENTS) ×2 IMPLANT
PIN GUIDE 1.2 (PIN) ×2 IMPLANT
PIN GUIDE GLENOPHERE 1.5MX300M (PIN) ×2 IMPLANT
PIN METAGLENE 2.5 (PIN) ×2 IMPLANT
PROTECTOR NERVE ULNAR (MISCELLANEOUS) ×3 IMPLANT
RESTRAINT HEAD UNIVERSAL NS (MISCELLANEOUS) ×3 IMPLANT
SCREW 4.5X18MM (Screw) ×4 IMPLANT
SCREW 48L (Screw) ×2 IMPLANT
SCREW BN 18X4.5XSTRL SHLDR (Screw) IMPLANT
SCREW LOCK DELTA XTEND 4.5X30 (Screw) ×2 IMPLANT
SLING ARM FOAM STRAP LRG (SOFTGOODS) ×3 IMPLANT
SMARTMIX MINI TOWER (MISCELLANEOUS) ×3
SPONGE SURGIFOAM ABS GEL 12-7 (HEMOSTASIS) ×1 IMPLANT
STEM MODULAR 14 (Trauma) IMPLANT
STRIP CLOSURE SKIN 1/2X4 (GAUZE/BANDAGES/DRESSINGS) ×2 IMPLANT
SUCTION FRAZIER HANDLE 12FR (TUBING) ×2
SUCTION TUBE FRAZIER 12FR DISP (TUBING) ×1 IMPLANT
SUT FIBERWIRE #2 38 T-5 BLUE (SUTURE) ×12
SUT MNCRL AB 4-0 PS2 18 (SUTURE) ×3 IMPLANT
SUT VIC AB 0 CT1 36 (SUTURE) ×3 IMPLANT
SUT VIC AB 0 CT2 27 (SUTURE) ×3 IMPLANT
SUT VIC AB 2-0 CT1 27 (SUTURE) ×4
SUT VIC AB 2-0 CT1 TAPERPNT 27 (SUTURE) ×1 IMPLANT
SUTURE FIBERWR #2 38 T-5 BLUE (SUTURE) ×4 IMPLANT
TAPE CLOTH SURG 6X10 WHT LF (GAUZE/BANDAGES/DRESSINGS) ×2 IMPLANT
TOWEL OR 17X26 10 PK STRL BLUE (TOWEL DISPOSABLE) ×3 IMPLANT
TOWER SMARTMIX MINI (MISCELLANEOUS) ×1 IMPLANT
YANKAUER SUCT BULB TIP 10FT TU (MISCELLANEOUS) ×3 IMPLANT

## 2019-09-03 NOTE — Brief Op Note (Signed)
09/03/2019  12:20 PM  PATIENT:  Kyle Bowen  73 y.o. male  PRE-OPERATIVE DIAGNOSIS:  Osteoarthritis left shoulder, end stage  POST-OPERATIVE DIAGNOSIS:  Osteoarthritis left shoulder, end stage, rotator cuff insufficiency  PROCEDURE:  Left shoulder reverse total shoulder replacement,  DePuy Delta Xtend  SURGEON:  Surgeon(s) and Role:    Netta Cedars, MD - Primary  PHYSICIAN ASSISTANT:   ASSISTANTS: [place] Bruns, PA-C   ANESTHESIA:   regional and general  EBL:  300 mL   BLOOD ADMINISTERED:none  DRAINS: none   LOCAL MEDICATIONS USED:  MARCAINE     SPECIMEN:  No Specimen  DISPOSITION OF SPECIMEN:  None   COUNTS:  YES  TOURNIQUET:  * No tourniquets in log *  DICTATION: .Other Dictation: Dictation Number [id number]  PLAN OF CARE: Admit to inpatient   PATIENT DISPOSITION:  PACU - hemodynamically stable.   Delay start of Pharmacological VTE agent (>24hrs) due to surgical blood loss or risk of bleeding: not applicable

## 2019-09-03 NOTE — Anesthesia Procedure Notes (Signed)
Anesthesia Regional Block: Axillary brachial plexus block   Pre-Anesthetic Checklist: ,, timeout performed, Correct Patient, Correct Site, Correct Laterality, Correct Procedure, Correct Position, site marked, Risks and benefits discussed,  Surgical consent,  Pre-op evaluation,  At surgeon's request and post-op pain management  Laterality: Left and Upper  Prep: chloraprep       Needles:  Injection technique: Single-shot     Needle Length: 5cm  Needle Gauge: 22     Additional Needles: Arrow StimuQuik ECHO Echogenic Stimulating PNB Needle  Procedures:,,,, ultrasound used (permanent image in chart),,,,  Narrative:  Start time: 09/03/2019 10:22 AM End time: 09/03/2019 10:27 AM Injection made incrementally with aspirations every 5 mL.  Performed by: Personally  Anesthesiologist: Oleta Mouse, MD

## 2019-09-03 NOTE — Interval H&P Note (Signed)
History and Physical Interval Note:  09/03/2019 9:31 AM  Kyle Bowen  has presented today for surgery, with the diagnosis of Osteoarthritis left shoulder.  The various methods of treatment have been discussed with the patient and family. After consideration of risks, benefits and other options for treatment, the patient has consented to  Procedure(s) with comments: Anatomic TOTAL SHOULDER ARTHROPLASTY (Left) - with interscalene block as a surgical intervention.  The patient's history has been reviewed, patient examined, no change in status, stable for surgery.  I have reviewed the patient's chart and labs.  Questions were answered to the patient's satisfaction.     Augustin Schooling

## 2019-09-03 NOTE — Progress Notes (Signed)
Assisted Dr. Moser with left, ultrasound guided, interscalene  block. Side rails up, monitors on throughout procedure. See vital signs in flow sheet. Tolerated Procedure well. 

## 2019-09-03 NOTE — Anesthesia Postprocedure Evaluation (Signed)
Anesthesia Post Note  Patient: Kyle Bowen  Procedure(s) Performed: Anatomic TOTAL SHOULDER ARTHROPLASTY (Left Shoulder)     Patient location during evaluation: PACU Anesthesia Type: Regional and General Level of consciousness: awake and alert Pain management: pain level controlled Vital Signs Assessment: post-procedure vital signs reviewed and stable Respiratory status: spontaneous breathing, nonlabored ventilation, respiratory function stable and patient connected to nasal cannula oxygen Cardiovascular status: blood pressure returned to baseline and stable Postop Assessment: no apparent nausea or vomiting Anesthetic complications: no    Last Vitals:  Vitals:   09/03/19 1613 09/03/19 1619  BP: (!) 142/89   Pulse: 68   Resp: 17   Temp:  36.6 C  SpO2: 100%     Last Pain:  Vitals:   09/03/19 1619  TempSrc: Oral  PainSc:                  [name]

## 2019-09-03 NOTE — Discharge Instructions (Signed)
Ice to the shoulder constantly.  Keep the incision covered and clean and dry for one week, then ok to get it wet in the shower. ° °Do exercise as instructed several times per day. ° °DO NOT reach behind your back or push up out of a chair with the operative arm. ° °Use a sling while you are up and around for comfort, may remove while seated.  Keep pillow propped behind the operative elbow. ° °Follow up with Dr Cristoval Teall in two weeks in the office, call [phone] for appt °

## 2019-09-03 NOTE — Op Note (Signed)
NAME: Kyle Bowen, Kyle Bowen MEDICAL RECORD [id number] ACCOUNT [id number] DATE OF BIRTH:December 06, 1945 FACILITY: [facility] LOCATION: [facility] PHYSICIAN:STEVEN Orlena Sheldon, MD  OPERATIVE REPORT  DATE OF PROCEDURE:  09/03/2019  PREOPERATIVE DIAGNOSIS:  Left shoulder end-stage osteoarthritis.  POSTOPERATIVE DIAGNOSIS:  Left shoulder end-stage osteoarthritis with rotator cuff insufficiency.  PROCEDURE PERFORMED:  Left reverse total shoulder arthroplasty using DePuy Delta Xtend prosthesis with subscapularis repair.  ATTENDING SURGEON:  Esmond Plants, MD  ASSISTANT:  Darol Destine, [place], who was scrubbed during the entire procedure and necessary for satisfactory completion of surgery.  ANESTHESIA:  General anesthesia was used plus interscalene block.  ESTIMATED BLOOD LOSS:  Less than 100 mL.  FLUID REPLACEMENT:  1500 mL crystalloid.  INSTRUMENT COUNTS:  Correct.  COMPLICATIONS:  No complications.  ANTIBIOTICS:  Perioperative antibiotics were given.  INDICATIONS:  The patient is a 73 year old male with a history of worsening left shoulder pain and dysfunction secondary to end-stage osteoarthritis.  The patient had a preoperative CT scan indicating bone-on-bone arthritis with cystic degeneration and  what looked like a competent rotator cuff.  I discussed with the patient given his poor function, range of motion issues, and suspicion for rotator cuff insufficiency, I wanted to be backed up with a reverse shoulder placement, in addition to the planned  anatomic shoulder replacement.  The patient understands and agrees with either procedure as deemed necessary intraoperatively.  Informed consent obtained.  DESCRIPTION OF PROCEDURE:  After an adequate level of anesthesia was achieved, the patient was positioned in the modified beach chair position.  Left shoulder correctly identified and sterilely prepped and draped in the usual manner.  Timeout called,  verifying correct patient, correct site.  We  entered the shoulder using standard deltopectoral approach, starting the coracoid process, extending down to the anterior humerus, dissection down through subcutaneous tissues using Bovie.  Cephalic vein  identified, taken laterally with the deltoid pectoralis taken medially.  Conjoined tendon identified and retracted medially.  We tenodesed the biceps in situ with 0 Vicryl figure-of-eight suture, incorporating the pectoralis tendon as a part of that  tenodesis.  We then released the subscapularis subperiosteally off the lesser tuberosity.  It was noted that the subscapularis was extremely thin, but it did have some muscle on the underside of it and felt like it was likely repairable.  We tagged that  for repair with #2 FiberWire suture in a modified Mason-Allen suture technique.  We then assessed the superior rotator cuff, and the supraspinatus was very thin, perhaps 1-2 mm, with some attritional tearing.  At this point, I elected to proceed with  reverse shoulder replacement rather than anatomic, concerned for rotator cuff insufficiency and poor postoperative function and potentially compromise of his replacement.  Once we made that decision, we released the supraspinatus remnant and the  infraspinatus, leaving teres minor intact.  We then extended the shoulder, delivering the humeral head out of the wound.  We entered the proximal humerus with a 6 mm reamer, reaming up to a size 14.  We then placed our 14 intramedullary resection guide  in place and then resected our humeral head at 10 degrees of retroversion with the oscillating saw for the Delta reverse prosthesis.  We removed excess osteophytes with a rongeur.  We then subluxed the humerus posteriorly and then gained 360-degree  exposure of the glenoid, removed the capsule and the labrum, finding our center point for a guide pin and placed our guide pin and then reamed for the metaglene baseplate, centered inferiorly and seated  on good bone and tilted  slightly inferiorly.  Once  we had our metaglene preparation complete, we did our peripheral hand reaming to make sure that the glenosphere would seated.  We used the rongeur to remove peripheral bone around the edges.  We then went ahead and drilled our central peg hole, impacted  the metaglene baseplate into place, and then screwed this securely with a 48 lock screw inferiorly, a 30 lock screw superiorly, and 18 nonlock screws anteriorly and posteriorly.  We had great baseplate security.  We used a 42+4 standard glenosphere and  screwed that down on the baseplate.  We were pleased with that in terms of the re-creation of an appropriate center of rotation for the shoulder.  We did a finger sweep to make sure no soft tissue was caught up in that bearing.  We irrigated thoroughly  and then directed our attention back towards the humeral side.  We finished our preparation of the humerus with the size 2 centered reamer.  We then selected a 14 stem with the 2 centered and screwed that into place and then impacted that trial.  We were  pleased with that in terms of bony support for the humeral and the fit for the humeral implant.  We next went ahead and placed a 42+3 poly trial humeral tray and reduced the shoulder.  We were pleased with that soft tissue balancing, nice and snug but  not too snug, and good stability and no impingement.  We removed all trial components from the humeral side, irrigated thoroughly, drilled drill holes in the lesser tuberosity, and placed #2 FiberWire suture for repair of the subscap.  We then went ahead  and used available bone graft from the humeral head with impaction grafting technique.  We impacted the HA-coated press-fit stem, a 14 stem and a 2 centered, impacted in 10 degrees of retroversion.  We then selected a 42+3 poly, impacted on the humeral  side and reduced the shoulder again, a nice little pop as it seated, very stable.  No gapping with inferior pole or external  rotation.  We felt that we could definitely repair the subscapularis, although it was thin and may still have some function, and  we repaired that anatomically back to bone.  We irrigated again, did a final inspection of the shoulder.  We used Bovie electrocautery to control any bleeding and then closed the deltopectoral interval with 0 Vicryl suture, followed by 2-0 Vicryl for  subcutaneous closure and 4-0 Monocryl for skin.  Steri-Strips applied, followed by a sterile dressing.  The patient tolerated surgery well.  LN/NUANCE  D:09/03/2019 T:09/03/2019 JOB:[id number]

## 2019-09-03 NOTE — Transfer of Care (Signed)
Immediate Anesthesia Transfer of Care Note  Patient: Kyle Bowen  Procedure(s) Performed: Anatomic TOTAL SHOULDER ARTHROPLASTY (Left Shoulder)  Patient Location: PACU  Anesthesia Type:General  Level of Consciousness: drowsy, patient cooperative and responds to stimulation  Airway & Oxygen Therapy: Patient Spontanous Breathing and Patient connected to face mask oxygen  Post-op Assessment: Report given to RN and Post -op Vital signs reviewed and stable  Post vital signs: Reviewed and stable  Last Vitals:  Vitals Value Taken Time  BP    Temp    Pulse    Resp    SpO2      Last Pain:  Vitals:   09/03/19 0821  TempSrc:   PainSc: 0-No pain      Patients Stated Pain Goal: 3 ([phone] [id number])  Complications: No apparent anesthesia complications

## 2019-09-03 NOTE — Anesthesia Procedure Notes (Signed)
Procedure Name: Intubation Date/Time: 09/03/2019 10:42 AM Performed by: Claudia Desanctis, CRNA Pre-anesthesia Checklist: Patient identified, Emergency Drugs available, Suction available and Patient being monitored Patient Re-evaluated:Patient Re-evaluated prior to induction Oxygen Delivery Method: Circle system utilized Preoxygenation: Pre-oxygenation with 100% oxygen Induction Type: IV induction Ventilation: Mask ventilation without difficulty Laryngoscope Size: 2 and Miller Grade View: Grade I Tube type: Oral Tube size: 7.5 mm Number of attempts: 1 Airway Equipment and Method: Stylet Placement Confirmation: ETT inserted through vocal cords under direct vision,  positive ETCO2 and breath sounds checked- equal and bilateral Secured at: 23 cm Tube secured with: Tape Dental Injury: Teeth and Oropharynx as per pre-operative assessment

## 2019-09-04 LAB — BASIC METABOLIC PANEL
Anion gap: 8 (ref 5–15)
BUN: 17 mg/dL (ref 8–23)
CO2: 22 mmol/L (ref 22–32)
Calcium: 8.4 mg/dL — ABNORMAL LOW (ref 8.9–10.3)
Chloride: 106 mmol/L (ref 98–111)
Creatinine, Ser: 0.93 mg/dL (ref 0.61–1.24)
GFR calc Af Amer: >60 mL/min (ref >60)
GFR calc non Af Amer: >60 mL/min (ref >60)
Glucose, Bld: 131 mg/dL — ABNORMAL HIGH (ref 70–99)
Potassium: 3.9 mmol/L (ref 3.5–5.1)
Sodium: 136 mmol/L (ref 135–145)

## 2019-09-04 LAB — HEMOGLOBIN AND HEMATOCRIT, BLOOD
HCT: 36.5 % — ABNORMAL LOW (ref 39.0–52.0)
Hemoglobin: 11.9 g/dL — ABNORMAL LOW (ref 13.0–17.0)

## 2019-09-04 NOTE — Progress Notes (Signed)
   09/04/19 1000  OT Visit Information  Last OT Received On 09/04/19  Assistance Needed +1  History of Present Illness s/p L RTSA, h/o  R TSA, heart block and myasthenia gravis  Precautions  Precautions Shoulder  Type of Shoulder Precautions sling on except for bathing, dressing and exercises.  May move elbow, wrist and fingers, may gently bring hand to face  Shoulder Interventions Shoulder sling/immobilizer;Off for dressing/bathing/exercises  Required Braces or Orthoses Sling  Cognition  Arousal/Alertness Awake/alert  Behavior During Therapy WFL for tasks assessed/performed  Overall Cognitive Status Within Functional Limits for tasks assessed  ADL  General ADL Comments pt reports he got on/off commode without assistance with nursing. Able to get up from chair with min guard/supervison for safety  General Comments  General comments (skin integrity, edema, etc.) pt ambulated around unit with min A to min guard; more steady as time progressed. Pt with some big lateral sways but able to right self.  Recommend wife walk with him initially for safety.  Added soft velfoam extension onto thumb piece as it pulls on his thumb.  Pt stood to brush teeth wtih set up supervision  OT - End of Session  Activity Tolerance Patient tolerated treatment well  Patient left in chair;with call bell/phone within reach  OT Assessment/Plan  Follow Up Recommendations Follow surgeon's recommendation for DC plan and follow-up therapies  OT Equipment None recommended by OT  AM-PAC OT "6 Clicks" Daily Activity Outcome Measure (Version 2)  Help from another person eating meals? 4  Help from another person taking care of personal grooming? 3  Help from another person toileting, which includes using toliet, bedpan, or urinal? 2  Help from another person bathing (including washing, rinsing, drying)? 2  Help from another person to put on and taking off regular upper body clothing? 2  Help from another person to put on and  taking off regular lower body clothing? 2  6 Click Score 15  OT Goal Progression  Progress towards OT goals Progressing toward goals--did not see wife prior to d/c; left my number for them to page me if she had questions. Pt verbalizes all.  OT Time Calculation  OT Start Time (ACUTE ONLY) [id number]  OT Stop Time (ACUTE ONLY) 0956  OT Time Calculation (min) 14 min  OT General Charges  $OT Visit 1 Visit  OT Treatments  $Self Care/Home Management  8-22 mins  Lesle Chris, OTR/L Acute Rehabilitation Services [phone] [facility] pager [phone] office 09/04/2019

## 2019-09-04 NOTE — Progress Notes (Signed)
    Subjective:  Patient reports pain as mild.  Denies N/V/CP/SOB.   Objective:   VITALS:   Vitals:   09/03/19 2107 09/04/19 0114 09/04/19 0436 09/04/19 0930  BP: 122/73 (!) 143/75 140/84 126/68  Pulse: 71 73 75 74  Resp: 19 19 16 16   Temp: 97.9 F (36.6 C) 98 F (36.7 C) 98 F (36.7 C) 98 F (36.7 C)  TempSrc: Oral Oral Oral Oral  SpO2: 99% 100% 99% 99%  Weight:      Height:        NAD ABD soft Neurovascular intact Sling intact  Lab Results  Component Value Date   WBC 7.3 08/27/2019   HGB 11.9 (L) 09/04/2019   HCT 36.5 (L) 09/04/2019   MCV 93.3 08/27/2019   PLT 175 08/27/2019   BMET    Component Value Date/Time   NA 136 09/04/2019 0241   NA 141 08/04/2018 1450   K 3.9 09/04/2019 0241   CL 106 09/04/2019 0241   CO2 22 09/04/2019 0241   GLUCOSE 131 (H) 09/04/2019 0241   BUN 17 09/04/2019 0241   BUN 16 08/04/2018 1450   CREATININE 0.93 09/04/2019 0241   CALCIUM 8.4 (L) 09/04/2019 0241   GFRNONAA >60 09/04/2019 0241   GFRAA >60 09/04/2019 0241     Assessment/Plan: 1 Day Post-Op   Active Problems:   H/O total shoulder replacement, left   Sling, NWB LUE PO pain control OT Dispo: d/c home   Kyle Bowen 09/04/2019, 10:13 AM   Rod Can, MD Cell: [phone] [place] is now [facility]  [place] [address], [address], [place], [place] [address] Phone: [phone] [email or web address] Facebook  [employer]

## 2019-09-04 NOTE — Evaluation (Signed)
Occupational Therapy Evaluation Patient Details Name: Kyle Bowen MRN: [id number] DOB: [birth year] Today's Date: 09/04/2019    History of Present Illness s/p L RTSA, h/o  R TSA, heart block and myasthenia gravis   Clinical Impression   73 year old man was admitted for the above sx.  Performed UB bathing, transfer to chair and exercises. Pt needed min A for SPT and he does not use AD at baseline. He has a higher commode but nothing to push from on R side.  Educated on protocol.  Will return later to further assess commode and make sure he is steady when up walking.    Follow Up Recommendations  Follow surgeon's recommendation for DC plan and follow-up therapies    Equipment Recommendations  (tba later today:  has higher commode)    Recommendations for Other Services       Precautions / Restrictions Precautions Precautions: Shoulder Type of Shoulder Precautions: sling on except for bathing, dressing and exercises.  May move elbow, wrist and fingers, may gently bring hand to face Shoulder Interventions: Shoulder sling/immobilizer;Off for dressing/bathing/exercises Precaution Booklet Issued: Yes (comment) Required Braces or Orthoses: Sling Restrictions LUE Weight Bearing: Weight bearing as tolerated      Mobility Bed Mobility Overal bed mobility: Needs Assistance             General bed mobility comments: min A with HOB raised  Transfers   Equipment used: None             General transfer comment: min A to stand from elevated bed and for spt to chair.  This was pt's first time OOB    Balance                                           ADL either performed or assessed with clinical judgement   ADL Overall ADL's : Needs assistance/impaired Eating/Feeding: Independent   Grooming: Set up                   Toilet Transfer: Minimal assistance;Stand-pivot(to chair)             General ADL Comments: pt needs overall mod to max A  for ADLS     Vision         Perception     Praxis      Pertinent Vitals/Pain       Hand Dominance Right   Extremity/Trunk Assessment Upper Extremity Assessment Upper Extremity Assessment: LUE deficits/detail(able to move wrist and fingers)           Communication Communication Communication: No difficulties   Cognition Arousal/Alertness: Awake/alert Behavior During Therapy: WFL for tasks assessed/performed Overall Cognitive Status: Within Functional Limits for tasks assessed                                     General Comments       Exercises Exercises: (educated on allowed exercises: AA elbow)   Shoulder Instructions      Home Living Family/patient expects to be discharged to:: Private residence Living Arrangements: Spouse/significant other Available Help at Discharge: Family Type of Home: House Home Access: Stairs to [employer] entry     Home Layout: One level     Bathroom Shower/Tub: Occupational psychologist: Handicapped height  Additional Comments: has a cane at home, doesn't use it      Prior Functioning/Environment Level of Independence: Independent                 OT Problem List: Decreased strength;Decreased activity tolerance;Decreased knowledge of use of DME or AE;Impaired balance (sitting and/or standing)      OT Treatment/Interventions: Self-care/ADL training;Therapeutic exercise;Patient/family education;Balance training;Therapeutic activities    OT Goals(Current goals can be found in the care plan section) Acute Rehab OT Goals Patient Stated Goal: return to independence OT Goal Formulation: With patient Time For Goal Achievement: 09/05/19 Potential to Achieve Goals: Good ADL Goals Pt Will Transfer to Toilet: with min guard assist;ambulating(high commode vs 3:1 over it) Additional ADL Goal #1: wife will verbalize understanding of assist for adls, precautions and allowed exercises  OT  Frequency: Min 2X/week   Barriers to D/C:            Co-evaluation              AM-PAC OT "6 Clicks" Daily Activity     Outcome Measure Help from another person eating meals?: None Help from another person taking care of personal grooming?: A Little Help from another person toileting, which includes using toliet, bedpan, or urinal?: A Lot Help from another person bathing (including washing, rinsing, drying)?: A Lot Help from another person to put on and taking off regular upper body clothing?: A Lot Help from another person to put on and taking off regular lower body clothing?: A Lot 6 Click Score: 15   End of Session    Activity Tolerance: Patient tolerated treatment well Patient left: in chair;with call bell/phone within reach  OT Visit Diagnosis: Muscle weakness (generalized) (M62.81)                Time: [id number] OT Time Calculation (min): 31 min Charges:  OT General Charges $OT Visit: 1 Visit OT Evaluation $OT Eval Low Complexity: 1 Low OT Treatments $Self Care/Home Management : 8-22 mins  Kyle Bowen, OTR/L Acute Rehabilitation Services [phone] [facility] pager [phone] office 09/04/2019  Kyle Bowen 09/04/2019, 8:29 AM

## 2019-09-04 NOTE — Plan of Care (Signed)
Patient discharged home in stable condition 

## 2019-09-04 NOTE — Discharge Summary (Signed)
Physician Discharge Summary  Patient ID: Kyle Bowen MRN: [id number] DOB/AGE: 12/17/1945 73 y.o.  Admit date: 09/03/2019 Discharge date: 09/04/2019  Admission Diagnoses:  <principal problem not specified>  Discharge Diagnoses:  Active Problems:   H/O total shoulder replacement, left   Past Medical History:  Diagnosis Date  . Arthritis   . Cancer ([place]) 91   prostate   . Complete heart block ([place]) 07/31/2018  . Complication of anesthesia    occ bp drops after surgery whrn getting up   . GERD (gastroesophageal reflux disease)    occ  . Heart block   . History of myasthenia gravis   . History of prostate cancer 08/01/2018  . Myasthenia ([place])   . Myasthenia gravis without (acute) exacerbation ([place]) 03/06/2016  . Presence of permanent cardiac pacemaker   . S/P shoulder replacement 10/14/2014    Surgeries: Procedure(s): Anatomic TOTAL SHOULDER ARTHROPLASTY on 09/03/2019   Consultants (if any):   Discharged Condition: Improved  Hospital Course: Kyle Bowen is an 73 y.o. male who was admitted 09/03/2019 with a diagnosis of <principal problem not specified> and went to the operating room on 09/03/2019 and underwent the above named procedures.    He was given perioperative antibiotics:  Anti-infectives (From admission, onward)   Start     Dose/Rate Route Frequency Ordered Stop   09/03/19 1600  clindamycin (CLEOCIN) IVPB 600 mg     600 mg 100 mL/hr over 30 Minutes Intravenous Every 6 hours 09/03/19 1506 09/04/19 0334   09/03/19 0815  clindamycin (CLEOCIN) IVPB 900 mg     900 mg 100 mL/hr over 30 Minutes Intravenous On call to O.R. 09/03/19 0801 09/03/19 1112    .  He was given sequential compression devices, early ambulation for DVT prophylaxis.  He benefited maximally from the hospital stay and there were no complications.    Recent vital signs:  Vitals:   09/04/19 0436 09/04/19 0930  BP: 140/84 126/68  Pulse: 75 74  Resp: 16 16  Temp: 98 F (36.7 C) 98 F  (36.7 C)  SpO2: 99% 99%    Recent laboratory studies:  Lab Results  Component Value Date   HGB 11.9 (L) 09/04/2019   HGB 14.7 08/27/2019   HGB 13.9 08/04/2018   Lab Results  Component Value Date   WBC 7.3 08/27/2019   PLT 175 08/27/2019   No results found for: INR Lab Results  Component Value Date   NA 136 09/04/2019   K 3.9 09/04/2019   CL 106 09/04/2019   CO2 22 09/04/2019   BUN 17 09/04/2019   CREATININE 0.93 09/04/2019   GLUCOSE 131 (H) 09/04/2019    Discharge Medications:   Allergies as of 09/04/2019      Reactions   Aluminum-containing Compounds Other (See Comments)   Myasthenic patient    Aminoglycosides Other (See Comments)   Myasthenic patient    Azithromycin Other (See Comments)   Myasthenic patient    Botulinum Toxins Other (See Comments)   Myasthenic patient    Calcium Channel Blockers Other (See Comments)   Myasthenic patient    Ciprofloxacin Other (See Comments)   Myasthenic patient    Colistin Other (See Comments)   Myasthenic patient    D Tubocurarine [tubocurarine] Other (See Comments)   Myasthenic patient    Erythromycin Base Other (See Comments)   Myasthenic patient    Gentamicin Other (See Comments)   Myasthenic patient    Iodinated Diagnostic Agents Other (See Comments)   Myasthenic patient  Kanamycin Other (See Comments)   Myasthenic patient    Macrolides And Ketolides Other (See Comments)   Myasthenic patient    Magnesium-containing Compounds Other (See Comments)   Myasthenic patient    Moxifloxacin Other (See Comments)   Myasthenic patient    Neomycin Other (See Comments)   Myasthenic patient    Norfloxacin Other (See Comments)   Myasthenic patient    Ofloxacin Other (See Comments)   Myasthenic patient    Pefloxacin Other (See Comments)   Myasthenic patient    Penicillamine Other (See Comments)   Myasthenic patient    Procainamide Other (See Comments)   Myasthenic patient    Propranolol Other (See Comments)    Myasthenic patient    Quinidine Other (See Comments)   Myasthenic patient    Quinine Derivatives Other (See Comments)   Myasthenic patient    Simethicone Other (See Comments)   Myasthenic patient    Streptomycin Other (See Comments)   Myasthenic patient    Succinylcholine Other (See Comments)   Myasthenic patient    Telithromycin Other (See Comments)   Myasthenic patient    Timolol Other (See Comments)   Myasthenic patient    Tobramycin Other (See Comments)   Myasthenic patient    Vecuronium Other (See Comments)   Myasthenic patient       Medication List    TAKE these medications   aspirin 81 MG tablet Take 81 mg by mouth daily.   meloxicam 15 MG tablet Commonly known as: MOBIC Take 15 mg by mouth daily as needed for pain.   methocarbamol 500 MG tablet Commonly known as: Robaxin Take 1 tablet (500 mg total) by mouth every 6 (six) hours as needed.   mycophenolate 500 MG tablet Commonly known as: CELLCEPT Take 1,000 mg by mouth 2 (two) times daily.   oxyCODONE-acetaminophen 5-325 MG tablet Commonly known as: Percocet Take 1 tablet by mouth every 4 (four) hours as needed for moderate pain or severe pain.   pyridostigmine 60 MG tablet Commonly known as: MESTINON Take 60 mg by mouth daily as needed (difficulty with tongue).   simvastatin 20 MG tablet Commonly known as: ZOCOR Take 20 mg by mouth daily.       Diagnostic Studies: Ct Shoulder Left Wo Contrast  Result Date: 08/12/2019 CLINICAL DATA:  Chronic left shoulder pain, decreased range of motion EXAM: CT OF THE UPPER LEFT EXTREMITY WITHOUT CONTRAST TECHNIQUE: Multidetector CT imaging of the upper left extremity was performed according to the standard protocol. COMPARISON:  None. FINDINGS: Bones/Joint/Cartilage Severe osteoarthritis of the right shoulder with complete joint space loss, subchondral sclerosis/cystic change, and large marginal osteophyte formation. Mild superior glenoid retroversion. Glenoid  subchondral cyst formation predominates within the anterior and central aspects of the glenoid. Humeral head is appropriately located without dislocation. Mild AC joint arthropathy. Small to moderate-sized glenohumeral joint effusion. There is a large amount of fluid distending the biceps tendon sheath. No appreciable fluid within the subacromial-subdeltoid bursa. Advanced degenerative changes of the visualized lower cervical spine. Ligaments Suboptimally assessed by CT. Muscles and Tendons No appreciable atrophy or fatty infiltration of the rotator cuff musculature. Rotator cuff tendons grossly intact within the limitations of this exam. Soft tissues Left chest wall implanted cardiac device. No axillary lymphadenopathy. Visualized portion of the left lung is clear. IMPRESSION: Severe left shoulder osteoarthritis, as above. Electronically Signed   By: Davina Poke M.D.   On: 08/12/2019 10:18   Dg Shoulder Left Port  Result Date: 09/03/2019 CLINICAL DATA:  Left  shoulder replacement EXAM: LEFT SHOULDER - 1 VIEW COMPARISON:  None. FINDINGS: Interval left total reverse shoulder arthroplasty without failure or complication. No acute fracture or dislocation. Mild arthropathy of the acromioclavicular joint. Postsurgical changes in the soft tissues around the left shoulder. IMPRESSION: Interval left total reverse shoulder arthroplasty. Electronically Signed   By: Kathreen Devoid   On: 09/03/2019 13:47    Disposition: Discharge disposition: 01-Home or Self Care       Discharge Instructions    Call MD / Call 911   Complete by: As directed    If you experience chest pain or shortness of breath, CALL 911 and be transported to the hospital emergency room.  If you develope a fever above 101 F, pus (white drainage) or increased drainage or redness at the wound, or calf pain, call your surgeon's office.   Constipation Prevention   Complete by: As directed    Drink plenty of fluids.  Prune juice may be helpful.   You may use a stool softener, such as Colace (over the counter) 100 mg twice a day.  Use MiraLax (over the counter) for constipation as needed.   Diet - low sodium heart healthy   Complete by: As directed    Discharge instructions   Complete by: As directed    See Dr. Veverly Fells' instructions   Increase activity slowly as tolerated   Complete by: As directed       Follow-up Information    Netta Cedars, MD. Call in 2 weeks.   Specialty: Orthopedic Surgery Why: [id number] Contact information: [address] [place] [address] [id number]            Signed: Hilton Cork Avion Patella 09/04/2019, 12:14 PM

## 2019-09-06 ENCOUNTER — Encounter (HOSPITAL_COMMUNITY): Payer: Self-pay | Admitting: Orthopedic Surgery

## 2019-09-15 ENCOUNTER — Encounter: Payer: Medicare Other | Admitting: Internal Medicine

## 2019-09-21 DIAGNOSIS — Z471 Aftercare following joint replacement surgery: Secondary | ICD-10-CM | POA: Diagnosis not present

## 2019-09-21 DIAGNOSIS — Z4789 Encounter for other orthopedic aftercare: Secondary | ICD-10-CM | POA: Diagnosis not present

## 2019-10-04 DIAGNOSIS — G7000 Myasthenia gravis without (acute) exacerbation: Secondary | ICD-10-CM | POA: Diagnosis not present

## 2019-10-04 DIAGNOSIS — Z79899 Other long term (current) drug therapy: Secondary | ICD-10-CM | POA: Diagnosis not present

## 2019-10-18 ENCOUNTER — Ambulatory Visit (INDEPENDENT_AMBULATORY_CARE_PROVIDER_SITE_OTHER): Payer: Medicare Other | Admitting: Internal Medicine

## 2019-10-18 ENCOUNTER — Other Ambulatory Visit: Payer: Self-pay

## 2019-10-18 ENCOUNTER — Encounter: Payer: Self-pay | Admitting: Internal Medicine

## 2019-10-18 VITALS — BP 122/82 | HR 90 | Ht 67.0 in | Wt 168.0 lb

## 2019-10-18 DIAGNOSIS — I442 Atrioventricular block, complete: Secondary | ICD-10-CM | POA: Diagnosis not present

## 2019-10-18 DIAGNOSIS — Z950 Presence of cardiac pacemaker: Secondary | ICD-10-CM

## 2019-10-18 NOTE — Patient Instructions (Signed)
Medication Instructions:  The current medical regimen is effective;  continue present plan and medications.  *If you need a refill on your cardiac medications before your next appointment, please call your pharmacy*  Follow-Up: At [facility], you and your health needs are our priority.  As part of our continuing mission to provide you with exceptional heart care, we have created designated Provider Care Teams.  These Care Teams include your primary Cardiologist (physician) and Advanced Practice Providers (APPs -  Physician Assistants and Nurse Practitioners) who all work together to provide you with the care you need, when you need it.  Your next appointment:   12 months  The format for your next appointment:   In Person  Provider:   Virl Axe, MD  Thank you for choosing [facility]!!

## 2019-10-18 NOTE — Progress Notes (Signed)
Patient Care Team: Street, Sharon Mt, MD as PCP - General (Family Medicine) Deboraha Sprang, MD as PCP - Cardiology (Cardiology)   HPI  Kyle Bowen is a 73 y.o. male Seen in follow-up for pacemaker implanted 9/19 for complete heart block.  RV apical pacing with both passive and active leads were associate with complex ventricular ectopy; hence, the lead was moved to the HIS Position     Myasthenia gravis; and discussed with his doctors at [facility] prior to device implantation.     The patient denies chest pain, shortness of breath, nocturnal dyspnea, orthopnea or peripheral edema.  There have been no palpitations, lightheadedness or syncope.   Myasthenia has acted up a little bit  Past Medical History:  Diagnosis Date  . Arthritis   . Cancer ([place]) 91   prostate   . Complete heart block ([place]) 07/31/2018  . Complication of anesthesia    occ bp drops after surgery whrn getting up   . GERD (gastroesophageal reflux disease)    occ  . Heart block   . History of myasthenia gravis   . History of prostate cancer 08/01/2018  . Myasthenia ([place])   . Myasthenia gravis without (acute) exacerbation ([place]) 03/06/2016  . Presence of permanent cardiac pacemaker   . S/P shoulder replacement 10/14/2014    Past Surgical History:  Procedure Laterality Date  . CATARACT EXTRACTION    . HERNIA REPAIR Left 05   inguinal  . JOINT REPLACEMENT Left 2016   Surgery was in [place]  . PACEMAKER IMPLANT N/A 08/05/2018   Procedure: PACEMAKER IMPLANT - Dual Chamber;  Surgeon: Deboraha Sprang, MD;  Location: [facility];  Service: Cardiovascular;  Laterality: N/A;  . PENILE PROSTHESIS IMPLANT  93   00.08 replaced  . PROSTATE SURGERY  91   ca  . TONSILLECTOMY    . TOTAL SHOULDER ARTHROPLASTY Right 10/14/2014   Procedure: RIGHT TOTAL SHOULDER ARTHROPLASTY;  Surgeon: Augustin Schooling, MD;  Location: [facility];  Service: Orthopedics;  Laterality: Right;  . TOTAL SHOULDER ARTHROPLASTY  Left 09/03/2019   Procedure: Anatomic TOTAL SHOULDER ARTHROPLASTY;  Surgeon: Netta Cedars, MD;  Location: [facility];  Service: Orthopedics;  Laterality: Left;  with interscalene block    Current Meds  Medication Sig  . aspirin 81 MG tablet Take 81 mg by mouth daily.  . meloxicam (MOBIC) 15 MG tablet Take 15 mg by mouth daily as needed for pain.   . methocarbamol (ROBAXIN) 500 MG tablet Take 1 tablet (500 mg total) by mouth every 6 (six) hours as needed.  . mycophenolate (CELLCEPT) 500 MG tablet Take 1,000 mg by mouth 2 (two) times daily.  Marland Kitchen pyridostigmine (MESTINON) 60 MG tablet Take 60 mg by mouth daily as needed (difficulty with tongue).   . simvastatin (ZOCOR) 20 MG tablet Take 20 mg by mouth daily.     Allergies  Allergen Reactions  . Aluminum-Containing Compounds Other (See Comments)    Myasthenic patient   . Aminoglycosides Other (See Comments)    Myasthenic patient   . Azithromycin Other (See Comments)    Myasthenic patient   . Botulinum Toxins Other (See Comments)    Myasthenic patient   . Calcium Channel Blockers Other (See Comments)    Myasthenic patient   . Ciprofloxacin Other (See Comments)    Myasthenic patient   . Colistin Other (See Comments)    Myasthenic patient   . D Tubocurarine [Tubocurarine] Other (See Comments)  Myasthenic patient   . Erythromycin Base Other (See Comments)    Myasthenic patient   . Gentamicin Other (See Comments)    Myasthenic patient   . Iodinated Diagnostic Agents Other (See Comments)    Myasthenic patient   . Kanamycin Other (See Comments)    Myasthenic patient   . Macrolides And Ketolides Other (See Comments)    Myasthenic patient   . Magnesium-Containing Compounds Other (See Comments)    Myasthenic patient   . Moxifloxacin Other (See Comments)    Myasthenic patient   . Neomycin Other (See Comments)    Myasthenic patient   . Norfloxacin Other (See Comments)    Myasthenic patient   . Ofloxacin Other (See Comments)     Myasthenic patient   . Pefloxacin Other (See Comments)    Myasthenic patient   . Penicillamine Other (See Comments)    Myasthenic patient   . Procainamide Other (See Comments)    Myasthenic patient   . Propranolol Other (See Comments)    Myasthenic patient   . Quinidine Other (See Comments)    Myasthenic patient   . Quinine Derivatives Other (See Comments)    Myasthenic patient   . Simethicone Other (See Comments)    Myasthenic patient   . Streptomycin Other (See Comments)    Myasthenic patient   . Succinylcholine Other (See Comments)    Myasthenic patient   . Telithromycin Other (See Comments)    Myasthenic patient   . Timolol Other (See Comments)    Myasthenic patient   . Tobramycin Other (See Comments)    Myasthenic patient   . Vecuronium Other (See Comments)    Myasthenic patient       Review of Systems negative except from HPI and PMH  Physical Exam BP 122/82   Pulse 90   Ht 5\' 7"  (1.702 m)   Wt 168 lb (76.2 kg)   SpO2 97%   BMI 26.31 kg/m  Well developed and well nourished in no acute distress HENT normal Neck supple with JVP-flat Clear Device pocket well healed; without hematoma or erythema.  There is no tethering  Regular rate and rhythm, no murmur Abd-soft with active BS No Clubbing cyanosis edema Skin-warm and dry A & Oriented  Grossly normal sensory and motor function    Assessment and  Plan   Complete heart block  Atrial tachycardia  Pacemaker-[employer]-His  Device function normal  Infrequent atrial tach

## 2019-11-10 DIAGNOSIS — Z961 Presence of intraocular lens: Secondary | ICD-10-CM | POA: Diagnosis not present

## 2019-11-17 ENCOUNTER — Ambulatory Visit (INDEPENDENT_AMBULATORY_CARE_PROVIDER_SITE_OTHER): Payer: Medicare Other | Admitting: *Deleted

## 2019-11-17 DIAGNOSIS — I442 Atrioventricular block, complete: Secondary | ICD-10-CM

## 2019-11-18 LAB — CUP PACEART REMOTE DEVICE CHECK
Battery Remaining Longevity: 62 mo
Battery Remaining Percentage: 95.5 %
Battery Voltage: 2.98 V
Brady Statistic AP VP Percent: 25 %
Brady Statistic AP VS Percent: 1 %
Brady Statistic AS VP Percent: 74 %
Brady Statistic AS VS Percent: 1 %
Brady Statistic RA Percent Paced: 25 %
Brady Statistic RV Percent Paced: 99 %
Date Time Interrogation Session: 20201216020013
Implantable Lead Implant Date: 20190904
Implantable Lead Implant Date: 20190904
Implantable Lead Location: 753859
Implantable Lead Location: 753860
Implantable Lead Model: 3830
Implantable Lead Model: 5076
Implantable Pulse Generator Implant Date: 20190904
Lead Channel Impedance Value: 400 Ohm
Lead Channel Impedance Value: 530 Ohm
Lead Channel Pacing Threshold Amplitude: 0.5 V
Lead Channel Pacing Threshold Amplitude: 1.25 V
Lead Channel Pacing Threshold Pulse Width: 0.5 ms
Lead Channel Pacing Threshold Pulse Width: 1 ms
Lead Channel Sensing Intrinsic Amplitude: 10.4 mV
Lead Channel Sensing Intrinsic Amplitude: 4.8 mV
Lead Channel Setting Pacing Amplitude: 2 V
Lead Channel Setting Pacing Amplitude: 3 V
Lead Channel Setting Pacing Pulse Width: 1 ms
Lead Channel Setting Sensing Sensitivity: 2 mV
Pulse Gen Model: 2272
Pulse Gen Serial Number: 9058775

## 2019-11-25 LAB — CUP PACEART INCLINIC DEVICE CHECK
Battery Remaining Longevity: 61 mo
Battery Voltage: 2.98 V
Brady Statistic RA Percent Paced: 18 %
Brady Statistic RV Percent Paced: 99.46 %
Date Time Interrogation Session: 20201116143000
Implantable Lead Implant Date: 20190904
Implantable Lead Implant Date: 20190904
Implantable Lead Location: 753859
Implantable Lead Location: 753860
Implantable Lead Model: 3830
Implantable Lead Model: 5076
Implantable Pulse Generator Implant Date: 20190904
Lead Channel Impedance Value: 425 Ohm
Lead Channel Impedance Value: 550 Ohm
Lead Channel Pacing Threshold Amplitude: 0.5 V
Lead Channel Pacing Threshold Amplitude: 0.5 V
Lead Channel Pacing Threshold Amplitude: 1.25 V
Lead Channel Pacing Threshold Amplitude: 1.25 V
Lead Channel Pacing Threshold Pulse Width: 0.5 ms
Lead Channel Pacing Threshold Pulse Width: 0.5 ms
Lead Channel Pacing Threshold Pulse Width: 1 ms
Lead Channel Pacing Threshold Pulse Width: 1 ms
Lead Channel Sensing Intrinsic Amplitude: 3.1 mV
Lead Channel Sensing Intrinsic Amplitude: 5 mV
Lead Channel Setting Pacing Amplitude: 2 V
Lead Channel Setting Pacing Amplitude: 3 V
Lead Channel Setting Pacing Pulse Width: 1 ms
Lead Channel Setting Sensing Sensitivity: 2 mV
Pulse Gen Model: 2272
Pulse Gen Serial Number: 9058775

## 2019-11-29 ENCOUNTER — Other Ambulatory Visit: Payer: Self-pay | Admitting: Internal Medicine

## 2020-02-07 DIAGNOSIS — Z79899 Other long term (current) drug therapy: Secondary | ICD-10-CM | POA: Diagnosis not present

## 2020-02-07 DIAGNOSIS — R531 Weakness: Secondary | ICD-10-CM | POA: Diagnosis not present

## 2020-02-07 DIAGNOSIS — G7000 Myasthenia gravis without (acute) exacerbation: Secondary | ICD-10-CM | POA: Diagnosis not present

## 2020-02-16 ENCOUNTER — Ambulatory Visit (INDEPENDENT_AMBULATORY_CARE_PROVIDER_SITE_OTHER): Payer: Medicare Other | Admitting: *Deleted

## 2020-02-16 DIAGNOSIS — I442 Atrioventricular block, complete: Secondary | ICD-10-CM | POA: Diagnosis not present

## 2020-02-16 LAB — CUP PACEART REMOTE DEVICE CHECK
Battery Remaining Longevity: 65 mo
Battery Remaining Percentage: 95.5 %
Battery Voltage: 2.98 V
Brady Statistic AP VP Percent: 25 %
Brady Statistic AP VS Percent: 1 %
Brady Statistic AS VP Percent: 74 %
Brady Statistic AS VS Percent: 1 %
Brady Statistic RA Percent Paced: 25 %
Brady Statistic RV Percent Paced: 99 %
Date Time Interrogation Session: 20210317020023
Implantable Lead Implant Date: 20190904
Implantable Lead Implant Date: 20190904
Implantable Lead Location: 753859
Implantable Lead Location: 753860
Implantable Lead Model: 3830
Implantable Lead Model: 5076
Implantable Pulse Generator Implant Date: 20190904
Lead Channel Impedance Value: 440 Ohm
Lead Channel Impedance Value: 580 Ohm
Lead Channel Pacing Threshold Amplitude: 0.5 V
Lead Channel Pacing Threshold Amplitude: 1.25 V
Lead Channel Pacing Threshold Pulse Width: 0.5 ms
Lead Channel Pacing Threshold Pulse Width: 1 ms
Lead Channel Sensing Intrinsic Amplitude: 5 mV
Lead Channel Sensing Intrinsic Amplitude: 9.2 mV
Lead Channel Setting Pacing Amplitude: 2 V
Lead Channel Setting Pacing Amplitude: 3 V
Lead Channel Setting Pacing Pulse Width: 1 ms
Lead Channel Setting Sensing Sensitivity: 2 mV
Pulse Gen Model: 2272
Pulse Gen Serial Number: 9058775

## 2020-02-17 NOTE — Progress Notes (Signed)
PPM Remote  

## 2020-05-17 ENCOUNTER — Ambulatory Visit (INDEPENDENT_AMBULATORY_CARE_PROVIDER_SITE_OTHER): Payer: Medicare Other | Admitting: *Deleted

## 2020-05-17 DIAGNOSIS — I442 Atrioventricular block, complete: Secondary | ICD-10-CM

## 2020-05-17 LAB — CUP PACEART REMOTE DEVICE CHECK
Battery Remaining Longevity: 65 mo
Battery Remaining Percentage: 95.5 %
Battery Voltage: 2.98 V
Brady Statistic AP VP Percent: 27 %
Brady Statistic AP VS Percent: 1 %
Brady Statistic AS VP Percent: 72 %
Brady Statistic AS VS Percent: 1 %
Brady Statistic RA Percent Paced: 27 %
Brady Statistic RV Percent Paced: 99 %
Date Time Interrogation Session: 20210616020012
Implantable Lead Implant Date: 20190904
Implantable Lead Implant Date: 20190904
Implantable Lead Location: 753859
Implantable Lead Location: 753860
Implantable Lead Model: 3830
Implantable Lead Model: 5076
Implantable Pulse Generator Implant Date: 20190904
Lead Channel Impedance Value: 430 Ohm
Lead Channel Impedance Value: 580 Ohm
Lead Channel Pacing Threshold Amplitude: 0.5 V
Lead Channel Pacing Threshold Amplitude: 1.25 V
Lead Channel Pacing Threshold Pulse Width: 0.5 ms
Lead Channel Pacing Threshold Pulse Width: 1 ms
Lead Channel Sensing Intrinsic Amplitude: 5 mV
Lead Channel Sensing Intrinsic Amplitude: 8.4 mV
Lead Channel Setting Pacing Amplitude: 2 V
Lead Channel Setting Pacing Amplitude: 3 V
Lead Channel Setting Pacing Pulse Width: 1 ms
Lead Channel Setting Sensing Sensitivity: 2 mV
Pulse Gen Model: 2272
Pulse Gen Serial Number: 9058775

## 2020-05-18 NOTE — Progress Notes (Signed)
Remote pacemaker transmission.   

## 2020-07-18 DIAGNOSIS — Z961 Presence of intraocular lens: Secondary | ICD-10-CM | POA: Diagnosis not present

## 2020-08-03 DIAGNOSIS — H26493 Other secondary cataract, bilateral: Secondary | ICD-10-CM | POA: Diagnosis not present

## 2020-08-03 DIAGNOSIS — R1310 Dysphagia, unspecified: Secondary | ICD-10-CM | POA: Diagnosis not present

## 2020-08-16 ENCOUNTER — Ambulatory Visit (INDEPENDENT_AMBULATORY_CARE_PROVIDER_SITE_OTHER): Payer: Medicare Other | Admitting: *Deleted

## 2020-08-16 DIAGNOSIS — I442 Atrioventricular block, complete: Secondary | ICD-10-CM | POA: Diagnosis not present

## 2020-08-16 LAB — CUP PACEART REMOTE DEVICE CHECK
Battery Remaining Longevity: 62 mo
Battery Remaining Percentage: 95.5 %
Battery Voltage: 2.98 V
Brady Statistic AP VP Percent: 28 %
Brady Statistic AP VS Percent: 1 %
Brady Statistic AS VP Percent: 71 %
Brady Statistic AS VS Percent: 1 %
Brady Statistic RA Percent Paced: 28 %
Brady Statistic RV Percent Paced: 99 %
Date Time Interrogation Session: 20210915020014
Implantable Lead Implant Date: 20190904
Implantable Lead Implant Date: 20190904
Implantable Lead Location: 753859
Implantable Lead Location: 753860
Implantable Lead Model: 3830
Implantable Lead Model: 5076
Implantable Pulse Generator Implant Date: 20190904
Lead Channel Impedance Value: 410 Ohm
Lead Channel Impedance Value: 510 Ohm
Lead Channel Pacing Threshold Amplitude: 0.5 V
Lead Channel Pacing Threshold Amplitude: 1.25 V
Lead Channel Pacing Threshold Pulse Width: 0.5 ms
Lead Channel Pacing Threshold Pulse Width: 1 ms
Lead Channel Sensing Intrinsic Amplitude: 5 mV
Lead Channel Sensing Intrinsic Amplitude: 5.5 mV
Lead Channel Setting Pacing Amplitude: 2 V
Lead Channel Setting Pacing Amplitude: 3 V
Lead Channel Setting Pacing Pulse Width: 1 ms
Lead Channel Setting Sensing Sensitivity: 2 mV
Pulse Gen Model: 2272
Pulse Gen Serial Number: 9058775

## 2020-08-18 NOTE — Progress Notes (Signed)
Remote pacemaker transmission.   

## 2020-08-23 DIAGNOSIS — K222 Esophageal obstruction: Secondary | ICD-10-CM | POA: Diagnosis not present

## 2020-08-23 DIAGNOSIS — K219 Gastro-esophageal reflux disease without esophagitis: Secondary | ICD-10-CM | POA: Diagnosis not present

## 2020-08-23 DIAGNOSIS — K2210 Ulcer of esophagus without bleeding: Secondary | ICD-10-CM | POA: Diagnosis not present

## 2020-08-23 DIAGNOSIS — R1310 Dysphagia, unspecified: Secondary | ICD-10-CM | POA: Diagnosis not present

## 2020-08-26 DIAGNOSIS — Z23 Encounter for immunization: Secondary | ICD-10-CM | POA: Diagnosis not present

## 2020-09-01 DIAGNOSIS — E785 Hyperlipidemia, unspecified: Secondary | ICD-10-CM | POA: Diagnosis not present

## 2020-09-01 DIAGNOSIS — M170 Bilateral primary osteoarthritis of knee: Secondary | ICD-10-CM | POA: Diagnosis not present

## 2020-09-01 DIAGNOSIS — Z79899 Other long term (current) drug therapy: Secondary | ICD-10-CM | POA: Diagnosis not present

## 2020-09-01 DIAGNOSIS — Z0000 Encounter for general adult medical examination without abnormal findings: Secondary | ICD-10-CM | POA: Diagnosis not present

## 2020-09-01 DIAGNOSIS — E8881 Metabolic syndrome: Secondary | ICD-10-CM | POA: Diagnosis not present

## 2020-09-01 DIAGNOSIS — G7000 Myasthenia gravis without (acute) exacerbation: Secondary | ICD-10-CM | POA: Diagnosis not present

## 2020-09-04 DIAGNOSIS — C61 Malignant neoplasm of prostate: Secondary | ICD-10-CM | POA: Diagnosis not present

## 2020-09-19 DIAGNOSIS — M25512 Pain in left shoulder: Secondary | ICD-10-CM | POA: Diagnosis not present

## 2020-09-20 DIAGNOSIS — K222 Esophageal obstruction: Secondary | ICD-10-CM | POA: Diagnosis not present

## 2020-10-11 DIAGNOSIS — H903 Sensorineural hearing loss, bilateral: Secondary | ICD-10-CM | POA: Diagnosis not present

## 2020-10-11 DIAGNOSIS — H6121 Impacted cerumen, right ear: Secondary | ICD-10-CM | POA: Diagnosis not present

## 2020-10-11 DIAGNOSIS — H838X3 Other specified diseases of inner ear, bilateral: Secondary | ICD-10-CM | POA: Diagnosis not present

## 2020-11-02 DIAGNOSIS — G7000 Myasthenia gravis without (acute) exacerbation: Secondary | ICD-10-CM | POA: Diagnosis not present

## 2020-11-02 DIAGNOSIS — Z79899 Other long term (current) drug therapy: Secondary | ICD-10-CM | POA: Diagnosis not present

## 2020-11-15 ENCOUNTER — Ambulatory Visit (INDEPENDENT_AMBULATORY_CARE_PROVIDER_SITE_OTHER): Payer: Medicare Other

## 2020-11-15 DIAGNOSIS — I442 Atrioventricular block, complete: Secondary | ICD-10-CM | POA: Diagnosis not present

## 2020-11-15 LAB — CUP PACEART REMOTE DEVICE CHECK
Battery Remaining Longevity: 59 mo
Battery Remaining Percentage: 95.5 %
Battery Voltage: 2.96 V
Brady Statistic AP VP Percent: 28 %
Brady Statistic AP VS Percent: 1 %
Brady Statistic AS VP Percent: 71 %
Brady Statistic AS VS Percent: 1 %
Brady Statistic RA Percent Paced: 28 %
Brady Statistic RV Percent Paced: 99 %
Date Time Interrogation Session: 20211215020014
Implantable Lead Implant Date: 20190904
Implantable Lead Implant Date: 20190904
Implantable Lead Location: 753859
Implantable Lead Location: 753860
Implantable Lead Model: 3830
Implantable Lead Model: 5076
Implantable Pulse Generator Implant Date: 20190904
Lead Channel Impedance Value: 400 Ohm
Lead Channel Impedance Value: 530 Ohm
Lead Channel Pacing Threshold Amplitude: 0.5 V
Lead Channel Pacing Threshold Amplitude: 1.25 V
Lead Channel Pacing Threshold Pulse Width: 0.5 ms
Lead Channel Pacing Threshold Pulse Width: 1 ms
Lead Channel Sensing Intrinsic Amplitude: 5 mV
Lead Channel Sensing Intrinsic Amplitude: 5.9 mV
Lead Channel Setting Pacing Amplitude: 2 V
Lead Channel Setting Pacing Amplitude: 3 V
Lead Channel Setting Pacing Pulse Width: 1 ms
Lead Channel Setting Sensing Sensitivity: 2 mV
Pulse Gen Model: 2272
Pulse Gen Serial Number: 9058775

## 2020-11-18 DIAGNOSIS — Z1152 Encounter for screening for COVID-19: Secondary | ICD-10-CM | POA: Diagnosis not present

## 2020-11-18 DIAGNOSIS — Z20828 Contact with and (suspected) exposure to other viral communicable diseases: Secondary | ICD-10-CM | POA: Diagnosis not present

## 2020-11-29 NOTE — Progress Notes (Signed)
Remote pacemaker transmission.   

## 2021-01-02 DIAGNOSIS — Z950 Presence of cardiac pacemaker: Secondary | ICD-10-CM | POA: Insufficient documentation

## 2021-01-05 ENCOUNTER — Encounter: Payer: Self-pay | Admitting: Internal Medicine

## 2021-01-05 ENCOUNTER — Other Ambulatory Visit: Payer: Self-pay

## 2021-01-05 ENCOUNTER — Ambulatory Visit (INDEPENDENT_AMBULATORY_CARE_PROVIDER_SITE_OTHER): Payer: Medicare Other | Admitting: Internal Medicine

## 2021-01-05 VITALS — BP 140/80 | HR 69 | Ht 67.5 in | Wt 163.0 lb

## 2021-01-05 DIAGNOSIS — Z950 Presence of cardiac pacemaker: Secondary | ICD-10-CM

## 2021-01-05 DIAGNOSIS — I442 Atrioventricular block, complete: Secondary | ICD-10-CM

## 2021-01-05 NOTE — Patient Instructions (Signed)

## 2021-01-05 NOTE — Progress Notes (Signed)
Patient Care Team: Street, Sharon Mt, MD as PCP - General (Family Medicine) Deboraha Sprang, MD as PCP - Cardiology (Cardiology)   HPI  Kyle Bowen is a 75 y.o. male Seen in follow-up for pacemaker implanted 9/19 for complete heart block.  RV apical pacing with both passive and active leads were associate with complex ventricular ectopy; hence, the lead was moved to the HIS Position     Has Myasthenia gravis;   The patient denies chest pain, shortness of breath, nocturnal dyspnea, orthopnea or peripheral edema.  There have been no palpitations, lightheadedness or syncope.    Past Medical History:  Diagnosis Date  . Arthritis   . Cancer ([place]) 89   prostate   . Complete heart block ([place]) 07/31/2018  . Complication of anesthesia    occ bp drops after surgery whrn getting up   . GERD (gastroesophageal reflux disease)    occ  . Heart block   . History of myasthenia gravis   . History of prostate cancer 08/01/2018  . Myasthenia ([place])   . Myasthenia gravis without (acute) exacerbation ([place]) 03/06/2016  . Presence of permanent cardiac pacemaker   . S/P shoulder replacement 10/14/2014    Past Surgical History:  Procedure Laterality Date  . CATARACT EXTRACTION    . HERNIA REPAIR Left 05   inguinal  . JOINT REPLACEMENT Left 2016   Surgery was in [place]  . PACEMAKER IMPLANT N/A 08/05/2018   Procedure: PACEMAKER IMPLANT - Dual Chamber;  Surgeon: Deboraha Sprang, MD;  Location: [facility];  Service: Cardiovascular;  Laterality: N/A;  . PENILE PROSTHESIS IMPLANT  93   00.08 replaced  . PROSTATE SURGERY  89   ca  . TONSILLECTOMY    . TOTAL SHOULDER ARTHROPLASTY Right 10/14/2014   Procedure: RIGHT TOTAL SHOULDER ARTHROPLASTY;  Surgeon: Augustin Schooling, MD;  Location: [facility];  Service: Orthopedics;  Laterality: Right;  . TOTAL SHOULDER ARTHROPLASTY Left 09/03/2019   Procedure: Anatomic TOTAL SHOULDER ARTHROPLASTY;  Surgeon: Netta Cedars, MD;  Location: [facility];   Service: Orthopedics;  Laterality: Left;  with interscalene block    Current Meds  Medication Sig  . amoxicillin (AMOXIL) 500 MG capsule as needed. Dental work  . aspirin 81 MG tablet Take 81 mg by mouth daily.  . meloxicam (MOBIC) 15 MG tablet Take 15 mg by mouth daily as needed for pain.   . mycophenolate (CELLCEPT) 500 MG tablet Take 1,500 mg by mouth 2 (two) times daily. 1000mg  tab am, 500mg  tab pm  . pantoprazole (PROTONIX) 40 MG tablet daily in the afternoon.  . pyridostigmine (MESTINON) 60 MG tablet Take 60 mg by mouth daily as needed (difficulty with tongue).   . simvastatin (ZOCOR) 40 MG tablet Take 20 mg by mouth daily.    Allergies  Allergen Reactions  . Aluminum-Containing Compounds Other (See Comments)    Myasthenic patient   . Aminoglycosides Other (See Comments)    Myasthenic patient   . Azithromycin Other (See Comments)    Myasthenic patient   . Botulinum Toxins Other (See Comments)    Myasthenic patient   . Calcium Channel Blockers Other (See Comments)    Myasthenic patient   . Ciprofloxacin Other (See Comments)    Myasthenic patient   . Colistin Other (See Comments)    Myasthenic patient   . D Tubocurarine [Tubocurarine] Other (See Comments)    Myasthenic patient   . Erythromycin Base Other (See Comments)  Myasthenic patient   . Gentamicin Other (See Comments)    Myasthenic patient   . Iodinated Diagnostic Agents Other (See Comments)    Myasthenic patient   . Kanamycin Other (See Comments)    Myasthenic patient   . Macrolides And Ketolides Other (See Comments)    Myasthenic patient   . Magnesium-Containing Compounds Other (See Comments)    Myasthenic patient   . Moxifloxacin Other (See Comments)    Myasthenic patient   . Neomycin Other (See Comments)    Myasthenic patient   . Norfloxacin Other (See Comments)    Myasthenic patient   . Ofloxacin Other (See Comments)    Myasthenic patient   . Pefloxacin Other (See Comments)    Myasthenic patient    . Penicillamine Other (See Comments)    Myasthenic patient   . Procainamide Other (See Comments)    Myasthenic patient   . Propranolol Other (See Comments)    Myasthenic patient   . Quinidine Other (See Comments)    Myasthenic patient   . Quinine Derivatives Other (See Comments)    Myasthenic patient   . Simethicone Other (See Comments)    Myasthenic patient   . Streptomycin Other (See Comments)    Myasthenic patient   . Succinylcholine Other (See Comments)    Myasthenic patient   . Telithromycin Other (See Comments)    Myasthenic patient   . Timolol Other (See Comments)    Myasthenic patient   . Tobramycin Other (See Comments)    Myasthenic patient   . Vecuronium Other (See Comments)    Myasthenic patient       Review of Systems negative except from HPI and PMH  Physical Exam BP 140/80   Pulse 69   Ht 5' 7.5" (1.715 m)   Wt 163 lb (73.9 kg)   SpO2 97%   BMI 25.15 kg/m  Well developed and well nourished in no acute distress HENT normal Neck supple with JVP-flat Clear Device pocket well healed; without hematoma or erythema.  There is no tethering  Regular rate and rhythm, no  murmur Abd-soft with active BS No Clubbing cyanosis  edema Skin-warm and dry A & Oriented  Grossly normal sensory and motor function  ECG   Sinus @ 69 19/16/44   Assessment and  Plan   Complete heart block  Atrial tachycardia  Pacemaker-[employer]-His    No interval atrial tachycardia.  Complete heart block is intermittent.  Today with Wenckebach second-degree block  Have reprogrammed his outputs and activated auto capture with the intention of prolonging battery.  The estimate increased by about 3.5 years.

## 2021-02-14 ENCOUNTER — Ambulatory Visit (INDEPENDENT_AMBULATORY_CARE_PROVIDER_SITE_OTHER): Payer: Medicare Other

## 2021-02-14 DIAGNOSIS — I442 Atrioventricular block, complete: Secondary | ICD-10-CM

## 2021-02-14 LAB — CUP PACEART INCLINIC DEVICE CHECK
Battery Remaining Longevity: 144 mo
Battery Voltage: 3.04 V
Brady Statistic RA Percent Paced: 0 %
Brady Statistic RV Percent Paced: 3.7 %
Date Time Interrogation Session: 20220204141500
Implantable Lead Implant Date: 20190904
Implantable Lead Implant Date: 20190904
Implantable Lead Location: 753859
Implantable Lead Location: 753860
Implantable Lead Model: 3830
Implantable Lead Model: 5076
Implantable Pulse Generator Implant Date: 20190904
Lead Channel Setting Pacing Amplitude: 2 V
Lead Channel Setting Pacing Amplitude: 2.5 V
Lead Channel Setting Pacing Pulse Width: 0.4 ms
Lead Channel Setting Sensing Sensitivity: 2 mV
Pulse Gen Model: 2272
Pulse Gen Serial Number: 3873687

## 2021-02-15 LAB — CUP PACEART REMOTE DEVICE CHECK
Battery Remaining Longevity: 111 mo
Battery Remaining Percentage: 95.5 %
Battery Voltage: 2.99 V
Brady Statistic AP VP Percent: 12 %
Brady Statistic AP VS Percent: 16 %
Brady Statistic AS VP Percent: 45 %
Brady Statistic AS VS Percent: 27 %
Brady Statistic RA Percent Paced: 27 %
Brady Statistic RV Percent Paced: 57 %
Date Time Interrogation Session: 20220316020021
Implantable Lead Implant Date: 20190904
Implantable Lead Implant Date: 20190904
Implantable Lead Location: 753859
Implantable Lead Location: 753860
Implantable Lead Model: 3830
Implantable Lead Model: 5076
Implantable Pulse Generator Implant Date: 20190904
Lead Channel Impedance Value: 410 Ohm
Lead Channel Impedance Value: 530 Ohm
Lead Channel Pacing Threshold Amplitude: 0.5 V
Lead Channel Pacing Threshold Amplitude: 1.125 V
Lead Channel Pacing Threshold Pulse Width: 0.5 ms
Lead Channel Pacing Threshold Pulse Width: 1 ms
Lead Channel Sensing Intrinsic Amplitude: 5 mV
Lead Channel Sensing Intrinsic Amplitude: 8.7 mV
Lead Channel Setting Pacing Amplitude: 1.375
Lead Channel Setting Pacing Amplitude: 2 V
Lead Channel Setting Pacing Pulse Width: 1 ms
Lead Channel Setting Sensing Sensitivity: 2 mV
Pulse Gen Model: 2272
Pulse Gen Serial Number: 3873687

## 2021-02-22 NOTE — Progress Notes (Signed)
Remote pacemaker transmission.   

## 2021-05-03 DIAGNOSIS — H903 Sensorineural hearing loss, bilateral: Secondary | ICD-10-CM | POA: Diagnosis not present

## 2021-05-03 DIAGNOSIS — H838X3 Other specified diseases of inner ear, bilateral: Secondary | ICD-10-CM | POA: Diagnosis not present

## 2021-05-16 ENCOUNTER — Ambulatory Visit (INDEPENDENT_AMBULATORY_CARE_PROVIDER_SITE_OTHER): Payer: Medicare Other

## 2021-05-16 DIAGNOSIS — I442 Atrioventricular block, complete: Secondary | ICD-10-CM | POA: Diagnosis not present

## 2021-05-18 LAB — CUP PACEART REMOTE DEVICE CHECK
Battery Remaining Longevity: 109 mo
Battery Remaining Percentage: 95.5 %
Battery Voltage: 2.99 V
Brady Statistic AP VP Percent: 12 %
Brady Statistic AP VS Percent: 22 %
Brady Statistic AS VP Percent: 35 %
Brady Statistic AS VS Percent: 30 %
Brady Statistic RA Percent Paced: 33 %
Brady Statistic RV Percent Paced: 47 %
Date Time Interrogation Session: 20220615020013
Implantable Lead Implant Date: 20190904
Implantable Lead Implant Date: 20190904
Implantable Lead Location: 753859
Implantable Lead Location: 753860
Implantable Lead Model: 3830
Implantable Lead Model: 5076
Implantable Pulse Generator Implant Date: 20190904
Lead Channel Impedance Value: 380 Ohm
Lead Channel Impedance Value: 480 Ohm
Lead Channel Pacing Threshold Amplitude: 0.5 V
Lead Channel Pacing Threshold Amplitude: 1.125 V
Lead Channel Pacing Threshold Pulse Width: 0.5 ms
Lead Channel Pacing Threshold Pulse Width: 1 ms
Lead Channel Sensing Intrinsic Amplitude: 4.2 mV
Lead Channel Sensing Intrinsic Amplitude: 5 mV
Lead Channel Setting Pacing Amplitude: 1.375
Lead Channel Setting Pacing Amplitude: 2 V
Lead Channel Setting Pacing Pulse Width: 1 ms
Lead Channel Setting Sensing Sensitivity: 2 mV
Pulse Gen Model: 2272
Pulse Gen Serial Number: 3873687

## 2021-06-07 NOTE — Progress Notes (Signed)
Remote pacemaker transmission.   

## 2021-08-09 DIAGNOSIS — R509 Fever, unspecified: Secondary | ICD-10-CM | POA: Diagnosis not present

## 2021-08-09 DIAGNOSIS — R300 Dysuria: Secondary | ICD-10-CM | POA: Diagnosis not present

## 2021-08-09 DIAGNOSIS — G7000 Myasthenia gravis without (acute) exacerbation: Secondary | ICD-10-CM | POA: Diagnosis not present

## 2021-08-09 DIAGNOSIS — Z20828 Contact with and (suspected) exposure to other viral communicable diseases: Secondary | ICD-10-CM | POA: Diagnosis not present

## 2021-08-15 ENCOUNTER — Ambulatory Visit (INDEPENDENT_AMBULATORY_CARE_PROVIDER_SITE_OTHER): Payer: Medicare Other

## 2021-08-15 DIAGNOSIS — I442 Atrioventricular block, complete: Secondary | ICD-10-CM

## 2021-08-15 LAB — CUP PACEART REMOTE DEVICE CHECK
Battery Remaining Longevity: 59 mo
Battery Remaining Percentage: 53 %
Battery Voltage: 3.01 V
Brady Statistic AP VP Percent: 12 %
Brady Statistic AP VS Percent: 23 %
Brady Statistic AS VP Percent: 25 %
Brady Statistic AS VS Percent: 40 %
Brady Statistic RA Percent Paced: 34 %
Brady Statistic RV Percent Paced: 36 %
Date Time Interrogation Session: 20220914020015
Implantable Lead Implant Date: 20190904
Implantable Lead Implant Date: 20190904
Implantable Lead Location: 753859
Implantable Lead Location: 753860
Implantable Lead Model: 3830
Implantable Lead Model: 5076
Implantable Pulse Generator Implant Date: 20190904
Lead Channel Impedance Value: 410 Ohm
Lead Channel Impedance Value: 490 Ohm
Lead Channel Pacing Threshold Amplitude: 0.5 V
Lead Channel Pacing Threshold Amplitude: 0.875 V
Lead Channel Pacing Threshold Pulse Width: 0.5 ms
Lead Channel Pacing Threshold Pulse Width: 1 ms
Lead Channel Sensing Intrinsic Amplitude: 3.6 mV
Lead Channel Sensing Intrinsic Amplitude: 5 mV
Lead Channel Setting Pacing Amplitude: 1.125
Lead Channel Setting Pacing Amplitude: 2 V
Lead Channel Setting Pacing Pulse Width: 1 ms
Lead Channel Setting Sensing Sensitivity: 2 mV
Pulse Gen Model: 2272
Pulse Gen Serial Number: 3873687

## 2021-08-16 DIAGNOSIS — N390 Urinary tract infection, site not specified: Secondary | ICD-10-CM | POA: Diagnosis not present

## 2021-08-17 ENCOUNTER — Emergency Department (HOSPITAL_COMMUNITY)
Admission: EM | Admit: 2021-08-17 | Discharge: 2021-08-18 | Disposition: A | Discharge status: Home / Self Care | Payer: Medicare Other | Attending: Emergency Medicine | Admitting: Emergency Medicine

## 2021-08-17 ENCOUNTER — Emergency Department (HOSPITAL_COMMUNITY): Payer: Medicare Other

## 2021-08-17 ENCOUNTER — Other Ambulatory Visit: Payer: Self-pay

## 2021-08-17 ENCOUNTER — Encounter (HOSPITAL_COMMUNITY): Payer: Self-pay

## 2021-08-17 DIAGNOSIS — Z791 Long term (current) use of non-steroidal anti-inflammatories (NSAID): Secondary | ICD-10-CM | POA: Insufficient documentation

## 2021-08-17 DIAGNOSIS — Z7982 Long term (current) use of aspirin: Secondary | ICD-10-CM | POA: Diagnosis not present

## 2021-08-17 DIAGNOSIS — Z8546 Personal history of malignant neoplasm of prostate: Secondary | ICD-10-CM | POA: Insufficient documentation

## 2021-08-17 DIAGNOSIS — Z950 Presence of cardiac pacemaker: Secondary | ICD-10-CM | POA: Diagnosis not present

## 2021-08-17 DIAGNOSIS — Z79899 Other long term (current) drug therapy: Secondary | ICD-10-CM | POA: Insufficient documentation

## 2021-08-17 DIAGNOSIS — R5383 Other fatigue: Secondary | ICD-10-CM | POA: Insufficient documentation

## 2021-08-17 DIAGNOSIS — Z9079 Acquired absence of other genital organ(s): Secondary | ICD-10-CM | POA: Diagnosis not present

## 2021-08-17 DIAGNOSIS — M791 Myalgia, unspecified site: Secondary | ICD-10-CM | POA: Insufficient documentation

## 2021-08-17 DIAGNOSIS — Z96612 Presence of left artificial shoulder joint: Secondary | ICD-10-CM | POA: Diagnosis not present

## 2021-08-17 DIAGNOSIS — R21 Rash and other nonspecific skin eruption: Secondary | ICD-10-CM | POA: Insufficient documentation

## 2021-08-17 DIAGNOSIS — R509 Fever, unspecified: Secondary | ICD-10-CM | POA: Insufficient documentation

## 2021-08-17 DIAGNOSIS — R7401 Elevation of levels of liver transaminase levels: Secondary | ICD-10-CM | POA: Insufficient documentation

## 2021-08-17 DIAGNOSIS — Z96611 Presence of right artificial shoulder joint: Secondary | ICD-10-CM | POA: Diagnosis not present

## 2021-08-17 DIAGNOSIS — I700 Atherosclerosis of aorta: Secondary | ICD-10-CM | POA: Diagnosis not present

## 2021-08-17 DIAGNOSIS — K5730 Diverticulosis of large intestine without perforation or abscess without bleeding: Secondary | ICD-10-CM | POA: Diagnosis not present

## 2021-08-17 DIAGNOSIS — R519 Headache, unspecified: Secondary | ICD-10-CM | POA: Diagnosis not present

## 2021-08-17 DIAGNOSIS — R109 Unspecified abdominal pain: Secondary | ICD-10-CM | POA: Diagnosis not present

## 2021-08-17 DIAGNOSIS — R9431 Abnormal electrocardiogram [ECG] [EKG]: Secondary | ICD-10-CM | POA: Diagnosis not present

## 2021-08-17 DIAGNOSIS — R0602 Shortness of breath: Secondary | ICD-10-CM | POA: Diagnosis not present

## 2021-08-17 LAB — COMPREHENSIVE METABOLIC PANEL
ALT: 122 U/L — ABNORMAL HIGH (ref 0–44)
AST: 53 U/L — ABNORMAL HIGH (ref 15–41)
Albumin: 3.2 g/dL — ABNORMAL LOW (ref 3.5–5.0)
Alkaline Phosphatase: 212 U/L — ABNORMAL HIGH (ref 38–126)
Anion gap: 9 (ref 5–15)
BUN: 12 mg/dL (ref 8–23)
CO2: 26 mmol/L (ref 22–32)
Calcium: 8.6 mg/dL — ABNORMAL LOW (ref 8.9–10.3)
Chloride: 98 mmol/L (ref 98–111)
Creatinine, Ser: 1.03 mg/dL (ref 0.61–1.24)
GFR, Estimated: >60 mL/min (ref >60)
Glucose, Bld: 105 mg/dL — ABNORMAL HIGH (ref 70–99)
Potassium: 3.9 mmol/L (ref 3.5–5.1)
Sodium: 133 mmol/L — ABNORMAL LOW (ref 135–145)
Total Bilirubin: 1.1 mg/dL (ref 0.3–1.2)
Total Protein: 6.5 g/dL (ref 6.5–8.1)

## 2021-08-17 LAB — CBC WITH DIFFERENTIAL/PLATELET
Abs Immature Granulocytes: 0.06 10*3/uL (ref 0.00–0.07)
Basophils Absolute: 0.1 10*3/uL (ref 0.0–0.1)
Basophils Relative: 1 %
Eosinophils Absolute: 0 10*3/uL (ref 0.0–0.5)
Eosinophils Relative: 0 %
HCT: 43 % (ref 39.0–52.0)
Hemoglobin: 14.4 g/dL (ref 13.0–17.0)
Immature Granulocytes: 1 %
Lymphocytes Relative: 24 %
Lymphs Abs: 2.4 10*3/uL (ref 0.7–4.0)
MCH: 30.4 pg (ref 26.0–34.0)
MCHC: 33.5 g/dL (ref 30.0–36.0)
MCV: 90.7 fL (ref 80.0–100.0)
Monocytes Absolute: 0.8 10*3/uL (ref 0.1–1.0)
Monocytes Relative: 8 %
Neutro Abs: 6.5 10*3/uL (ref 1.7–7.7)
Neutrophils Relative %: 66 %
Platelets: 203 10*3/uL (ref 150–400)
RBC: 4.74 MIL/uL (ref 4.22–5.81)
RDW: 12.6 % (ref 11.5–15.5)
WBC: 9.8 10*3/uL (ref 4.0–10.5)
nRBC: 0 % (ref 0.0–0.2)

## 2021-08-17 LAB — URINALYSIS, ROUTINE W REFLEX MICROSCOPIC
Bacteria, UA: NONE SEEN
Bilirubin Urine: NEGATIVE
Glucose, UA: NEGATIVE mg/dL
Hgb urine dipstick: NEGATIVE
Ketones, ur: NEGATIVE mg/dL
Leukocytes,Ua: NEGATIVE
Nitrite: NEGATIVE
Protein, ur: 30 mg/dL — AB
Specific Gravity, Urine: 1.017 (ref 1.005–1.030)
pH: 5 (ref 5.0–8.0)

## 2021-08-17 LAB — LACTIC ACID, PLASMA: Lactic Acid, Venous: 1.3 mmol/L (ref 0.5–1.9)

## 2021-08-17 NOTE — ED Triage Notes (Signed)
Pt reports intermittent fever for the past 10 days, hx of MG. Fever as high as 103. Pt reports some sinus tenderness. Pt denies chest pain, abd or nausea but some sob. Resp e.u

## 2021-08-17 NOTE — ED Provider Notes (Signed)
Emergency Medicine Provider Triage Evaluation Note  Kyle Bowen , a 75 y.o. male  was evaluated in triage.  Pt complains of intermittent fever x10 days.  Patient is also reporting feeling short of breath.  Denies any pain anywhere, denies any recent changes medicine.  No known sick contacts..  Review of Systems  Positive: Fever, shortness of breath Negative: Chest pain, fatigue, abdominal pain, nausea, vomiting  Physical Exam  BP 137/88 (BP Location: Right Arm)   Pulse 78   Temp 98.5 F (36.9 C) (Oral)   Resp 16   SpO2 100%  Gen:   Awake, no distress   Resp:  Normal effort  MSK:   Moves extremities without difficulty  Other:  Lungs clear to auscultation bilaterally, no edema to the LE  Medical Decision Making  Medically screening exam initiated at 4:59 PM.  Appropriate orders placed.  Kyle Bowen was informed that the remainder of the evaluation will be completed by another provider, this initial triage assessment does not replace that evaluation, and the importance of remaining in the ED until their evaluation is complete.  Patient is not febrile in triage, no tachycardia concerning for sepsis.  Will check a lactic and cast a broad net for fever work-up in an elderly patient.   Sherrill Raring, PA-C 08/17/21 1700    Godfrey Pick, MD 08/19/21 1536

## 2021-08-18 ENCOUNTER — Emergency Department (HOSPITAL_COMMUNITY): Payer: Medicare Other

## 2021-08-18 DIAGNOSIS — Z9079 Acquired absence of other genital organ(s): Secondary | ICD-10-CM | POA: Diagnosis not present

## 2021-08-18 DIAGNOSIS — R509 Fever, unspecified: Secondary | ICD-10-CM | POA: Diagnosis not present

## 2021-08-18 DIAGNOSIS — K5730 Diverticulosis of large intestine without perforation or abscess without bleeding: Secondary | ICD-10-CM | POA: Diagnosis not present

## 2021-08-18 DIAGNOSIS — I700 Atherosclerosis of aorta: Secondary | ICD-10-CM | POA: Diagnosis not present

## 2021-08-18 DIAGNOSIS — R9431 Abnormal electrocardiogram [ECG] [EKG]: Secondary | ICD-10-CM | POA: Diagnosis not present

## 2021-08-18 DIAGNOSIS — R7401 Elevation of levels of liver transaminase levels: Secondary | ICD-10-CM | POA: Diagnosis not present

## 2021-08-18 LAB — HEPATITIS PANEL, ACUTE
HCV Ab: NONREACTIVE
Hep A IgM: NONREACTIVE
Hep B C IgM: NONREACTIVE
Hepatitis B Surface Ag: NONREACTIVE

## 2021-08-18 LAB — ACETAMINOPHEN LEVEL: Acetaminophen (Tylenol), Serum: <10 ug/mL — ABNORMAL LOW (ref 10–30)

## 2021-08-18 MED ORDER — DOXYCYCLINE HYCLATE 100 MG PO CAPS: 100.0000 mg | ORAL_CAPSULE | Freq: Two times a day (BID) | ORAL | 0 refills | Status: AC

## 2021-08-18 NOTE — ED Notes (Signed)
Pt to CT

## 2021-08-18 NOTE — ED Provider Notes (Signed)
[facility] EMERGENCY DEPARTMENT Provider Note  CSN: [id number] Arrival date & time: 08/17/21 1605  Chief Complaint(s) Fever  HPI Kyle Bowen is a 75 y.o. male with a past medical history listed below who presents to the emergency department with 2 weeks of persistent fevers.  He was initially seen and diagnosed with urinary tract infection initially treated with Augmentin but then switched to Keflex.  Currently has 3 days left of Keflex course.  He was referred to the emergency department due to persistent fevers despite treatment.  Fevers are relieved with Tylenol and Motrin. He has associated fatigue, myalgias, slight sinus headache. No chest pain.  He does have a mild cough.  No nausea or vomiting.  No abdominal pain.  No diarrhea. Patient also developed rash a week ago. No known insect or tick bites.  No recent travel.  No camping.   Fever  Past Medical History Past Medical History:  Diagnosis Date   Arthritis    Cancer ([place]) 89   prostate    Complete heart block ([place]) [id number]   Complication of anesthesia    occ bp drops after surgery whrn getting up    GERD (gastroesophageal reflux disease)    occ   Heart block    History of myasthenia gravis    History of prostate cancer 08/01/2018   Myasthenia ([place])    Myasthenia gravis without (acute) exacerbation ([place]) 03/06/2016   Presence of permanent cardiac pacemaker    S/P shoulder replacement 10/14/2014   Patient Active Problem List   Diagnosis Date Noted   Pacemaker - STJ 01/02/2021   H/O total shoulder replacement, left 09/03/2019   History of prostate cancer 08/01/2018   Complete heart block ([place]) 07/31/2018   History of myasthenia gravis    Myasthenia gravis without (acute) exacerbation ([place]) 03/06/2016   S/P shoulder replacement 10/14/2014   Home Medication(s) Prior to Admission medications   Medication Sig Start Date End Date Taking? Authorizing Provider  aspirin 81 MG tablet Take 81 mg  by mouth at bedtime.   Yes [provider]  cephALEXin (KEFLEX) 500 MG capsule Take 500 mg by mouth See admin instructions. Tid x 7 days 08/13/21  Yes [provider]  doxycycline (VIBRAMYCIN) 100 MG capsule Take 1 capsule (100 mg total) by mouth 2 (two) times daily for 7 days. 08/18/21 08/25/21 Yes Ninoska Goswick, Grayce Sessions, MD  meloxicam (MOBIC) 15 MG tablet Take 15 mg by mouth daily.   Yes [provider]  mycophenolate (CELLCEPT) 500 MG tablet Take 1,500 mg by mouth See admin instructions. 2000 mg in the morning 1000 mg at bedtime 07/22/18  Yes [provider]  pantoprazole (PROTONIX) 40 MG tablet Take 40 mg by mouth 2 (two) times daily. 08/23/20  Yes [provider]  pyridostigmine (MESTINON) 60 MG tablet Take 60 mg by mouth daily as needed (difficulty with tongue).  07/08/18  Yes [provider]  simvastatin (ZOCOR) 40 MG tablet Take 20 mg by mouth daily. 12/27/20  Yes [provider]  Past Surgical History Past Surgical History:  Procedure Laterality Date   CATARACT EXTRACTION     HERNIA REPAIR Left 05   inguinal   JOINT REPLACEMENT Left 2016   Surgery was in [place] N/A 08/05/2018   Procedure: PACEMAKER IMPLANT - Dual Chamber;  Surgeon: Deboraha Sprang, MD;  Location: [facility];  Service: Cardiovascular;  Laterality: N/A;   PENILE PROSTHESIS IMPLANT  93   00.08 replaced   PROSTATE SURGERY  89   ca   TONSILLECTOMY     TOTAL SHOULDER ARTHROPLASTY Right 10/14/2014   Procedure: RIGHT TOTAL SHOULDER ARTHROPLASTY;  Surgeon: Augustin Schooling, MD;  Location: [facility];  Service: Orthopedics;  Laterality: Right;   TOTAL SHOULDER ARTHROPLASTY Left 09/03/2019   Procedure: Anatomic TOTAL SHOULDER ARTHROPLASTY;  Surgeon: Netta Cedars, MD;  [facility];  Service: Orthopedics;  Laterality:  Left;  with interscalene block   Family History Family History  Problem Relation Age of Onset   Aneurysm Mother    Alzheimer's disease Father    Multiple sclerosis Brother     Social History Social History   Tobacco Use   Smoking status: Never   Smokeless tobacco: Never  Vaping Use   Vaping Use: Never used  Substance Use Topics   Alcohol use: Yes    Comment: occ   Drug use: No   Allergies Aluminum-containing compounds, Aminoglycosides, Azithromycin, Botulinum toxins, Calcium channel blockers, Ciprofloxacin, Colistin, D tubocurarine [tubocurarine], Erythromycin base, Gentamicin, Iodinated diagnostic agents, Kanamycin, Macrolides and ketolides, Magnesium-containing compounds, Moxifloxacin, Neomycin, Norfloxacin, Ofloxacin, Pefloxacin, Penicillamine, Procainamide, Propranolol, Quinidine, Quinine derivatives, Simethicone, Streptomycin, Succinylcholine, Telithromycin, Timolol, Tobramycin, and Vecuronium  Review of Systems Review of Systems  Constitutional:  Positive for fever.  All other systems are reviewed and are negative for acute change except as noted in the HPI  Physical Exam Vital Signs  I have reviewed the triage vital signs BP (!) 158/94   Pulse 73   Temp 98.6 F (37 C) (Oral)   Resp 13   SpO2 97%   Physical Exam Vitals reviewed.  Constitutional:      General: He is not in acute distress.    Appearance: He is well-developed. He is not diaphoretic.  HENT:     Head: Normocephalic and atraumatic.     Right Ear: Tympanic membrane normal.     Left Ear: Tympanic membrane normal.     Nose:     Right Sinus: Maxillary sinus tenderness present.     Left Sinus: Maxillary sinus tenderness present.     Mouth/Throat:     Pharynx: Oropharynx is clear.  Eyes:     General: No scleral icterus.       Right eye: No discharge.        Left eye: No discharge.     Conjunctiva/sclera: Conjunctivae normal.     Pupils: Pupils are equal, round, and reactive to light.   Cardiovascular:     Rate and Rhythm: Normal rate and regular rhythm.     Heart sounds: No murmur heard.   No friction rub. No gallop.  Pulmonary:     Effort: Pulmonary effort is normal. No respiratory distress.     Breath sounds: Normal breath sounds. No stridor. No rales.  Abdominal:     General: There is no distension.     Palpations: Abdomen is soft.     Tenderness: There is no abdominal tenderness.  Musculoskeletal:        General: No tenderness.  Cervical back: Normal range of motion and neck supple.  Skin:    General: Skin is warm and dry.     Findings: Rash present. No erythema.     Comments: Dispersed petechial rash throughout entire body involves the palms of the hands  Neurological:     Mental Status: He is alert and oriented to person, place, and time.    ED Results and Treatments Labs (all labs ordered are listed, but only abnormal results are displayed) Labs Reviewed  COMPREHENSIVE METABOLIC PANEL - Abnormal; Notable for the following components:      Result Value   Sodium 133 (*)    Glucose, Bld 105 (*)    Calcium 8.6 (*)    Albumin 3.2 (*)    AST 53 (*)    ALT 122 (*)    Alkaline Phosphatase 212 (*)    All other components within normal limits  URINALYSIS, ROUTINE W REFLEX MICROSCOPIC - Abnormal; Notable for the following components:   Protein, ur 30 (*)    All other components within normal limits  ACETAMINOPHEN LEVEL - Abnormal; Notable for the following components:   Acetaminophen (Tylenol), Serum <10 (*)    All other components within normal limits  CBC WITH DIFFERENTIAL/PLATELET  LACTIC ACID, PLASMA  HEPATITIS PANEL, ACUTE  ROCKY MTN SPOTTED FVR ABS PNL(IGG+IGM)                                                                                                                         EKG  EKG Interpretation  Date/Time:  Saturday August 18 2021 04:02:36 EDT Ventricular Rate:  78 PR Interval:  172 QRS Duration: 97 QT Interval:  397 QTC  Calculation: 453 R Axis:   -62 Text Interpretation: Sinus rhythm Left anterior fascicular block Abnormal R-wave progression, late transition Confirmed by Addison Lank [phone]) on 08/18/2021 5:08:13 AM       Radiology CT ABDOMEN PELVIS WO CONTRAST  Result Date: 08/18/2021 CLINICAL DATA:  Fever of unknown origin. EXAM: CT ABDOMEN AND PELVIS WITHOUT CONTRAST TECHNIQUE: Multidetector CT imaging of the abdomen and pelvis was performed following the standard protocol without IV contrast. COMPARISON:  None. FINDINGS: Lower chest: Subsegmental atelectasis noted dependent lung bases. Hepatobiliary: No focal abnormality in the liver on this study without intravenous contrast. There is no evidence for gallstones, gallbladder wall thickening, or pericholecystic fluid. No intrahepatic or extrahepatic biliary dilation. Pancreas: No focal mass lesion. No dilatation of the main duct. No intraparenchymal cyst. No peripancreatic edema. Spleen: No splenomegaly. No focal mass lesion. Adrenals/Urinary Tract: No adrenal nodule or mass. Kidneys unremarkable. No evidence for hydroureter. The urinary bladder appears normal for the degree of distention. Stomach/Bowel: Stomach is nondistended. Duodenum is normally positioned as is the ligament of Treitz. No small bowel wall thickening. No small bowel dilatation. The terminal ileum is normal. The appendix is best seen on coronal images and is unremarkable. No gross colonic mass. No colonic wall thickening. Diverticular changes are noted in the left colon  without evidence of diverticulitis. Vascular/Lymphatic: There is moderate atherosclerotic calcification of the abdominal aorta without aneurysm. There is no gastrohepatic or hepatoduodenal ligament lymphadenopathy. No retroperitoneal or mesenteric lymphadenopathy. No pelvic sidewall lymphadenopathy. Reproductive: Prostate gland surgically absent. Penile prosthetic device evident. Other: No intraperitoneal free fluid. Musculoskeletal:  No worrisome lytic or sclerotic osseous abnormality. IMPRESSION: 1. Study with no acute findings in the abdomen or pelvis. Specifically, no findings to explain the patient's history of fever. 2. Left colonic diverticulosis without diverticulitis. 3. Aortic Atherosclerosis (ICD10-I70.0). Motion degraded Electronically Signed   By: Misty Stanley M.D.   On: 08/18/2021 05:59   US Abdomen Limited RUQ (LIVER/GB)  Result Date: 08/18/2021 CLINICAL DATA:  Transaminitis EXAM: ULTRASOUND ABDOMEN LIMITED RIGHT UPPER QUADRANT COMPARISON:  None. FINDINGS: Gallbladder: No gallstones or wall thickening visualized. No sonographic Murphy sign noted by sonographer. Common bile duct: Diameter: 5 mm Liver: No focal lesion identified. Within normal limits in parenchymal echogenicity. Portal vein is patent on color Doppler imaging with normal direction of blood flow towards the liver. IMPRESSION: Normal right upper quadrant ultrasound. Electronically Signed   By: Jorje Guild M.D.   On: 08/18/2021 05:03    Pertinent labs & imaging results that were available during my care of the patient were reviewed by me and considered in my medical decision making (see MDM for details).  Medications Ordered in ED Medications - No data to display                                                                                                                                   Procedures Procedures  (including critical care time)  Medical Decision Making / ED Course I have reviewed the nursing notes for this encounter and the patient's prior records (if available in EHR or on provided paperwork).  ELOI EASTES was evaluated in Emergency Department on 08/18/2021 for the symptoms described in the history of present illness. He was evaluated in the context of the global COVID-19 pandemic, which necessitated consideration that the patient might be at risk for infection with the SARS-CoV-2 virus that causes COVID-19. Institutional  protocols and algorithms that pertain to the evaluation of patients at risk for COVID-19 are in a state of rapid change based on information released by regulatory bodies including the CDC and federal and state organizations. These policies and algorithms were followed during the patient's care in the ED.     Patient presents with 2 weeks of persistent fevers. Currently being treated for urinary tract infection. They brought the urinalysis from outside facility which is consistent with a urinary tract infection. Urinalysis here is clean.  Screening labs obtain. Reassuring without leukocytosis, anemia or thrombocytopenia.. We did note evidence of transaminitis.  No priors for comparison. Metabolic panel notable for mild hyponatremia.  Chest x-ray without evidence of pneumonia. No evidence of acute otitis media, pharyngitis.  Not consistent with meningitis.   Work-up expanded given the transaminitis.  Hepatitis panel, acetaminophen level, right upper quadrant and CT scan of the abdomen obtained. Hepatitis panel pending. Tylenol level negative. Right upper quadrant and CT of the abdomen pelvis negative.  Given the rash and hyponatremia, RMSF titer was ordered.  Pending.   Pertinent labs & imaging results that were available during my care of the patient were reviewed by me and considered in my medical decision making:  Since being in the emergency department for over 12 hours, the patient has not had any fevers.  He has not required any antipyretics.  This appears to be the longest time he has gone without a fever.  Patient instructed to complete his Keflex course. Recommend he follow-up the results of the hepatitis panel as well as the RMSF panel. If RMSF is positive he was instructed to start taking course of doxycycline.  Prescription provided to patient upon discharge.  Recommended to follow-up closely with primary care provider  Final Clinical Impression(s) / ED Diagnoses Final  diagnoses:  Transaminitis  Fever, unspecified fever cause  Rash   The patient appears reasonably screened and/or stabilized for discharge and I doubt any other medical condition or other [facility] requiring further screening, evaluation, or treatment in the ED at this time prior to discharge. Safe for discharge with strict return precautions.  Disposition: Discharge  Condition: Good  I have discussed the results, Dx and Tx plan with the patient/family who expressed understanding and agree(s) with the plan. Discharge instructions discussed at length. The patient/family was given strict return precautions who verbalized understanding of the instructions. No further questions at time of discharge.    ED Discharge Orders          Ordered    doxycycline (VIBRAMYCIN) 100 MG capsule  2 times daily        08/18/21 0745             Follow Up: Street, Sharon Mt, MD [address] [place] [address] [phone]  Call  to schedule an appointment for close follow up     This chart was dictated using voice recognition software.  Despite best efforts to proofread,  errors can occur which can change the documentation meaning.    Fatima Blank, MD 08/18/21 2015

## 2021-08-18 NOTE — Discharge Instructions (Addendum)
Please follow-up on the pending lab test. If the RMSF titer is positive please begin taking the doxycycline as prescribed. Regardless please follow-up with your primary care provider to go over results and discuss additional options.

## 2021-08-18 NOTE — ED Notes (Signed)
ED Provider at bedside. 

## 2021-08-18 NOTE — ED Notes (Signed)
Ultrasound at bedside

## 2021-08-22 LAB — ROCKY MTN SPOTTED FVR ABS PNL(IGG+IGM)
RMSF IgG: NEGATIVE
RMSF IgM: 0.35 index (ref 0.00–0.89)

## 2021-08-23 NOTE — Progress Notes (Signed)
Remote pacemaker transmission.   

## 2021-09-03 DIAGNOSIS — E785 Hyperlipidemia, unspecified: Secondary | ICD-10-CM | POA: Diagnosis not present

## 2021-09-03 DIAGNOSIS — Z79899 Other long term (current) drug therapy: Secondary | ICD-10-CM | POA: Diagnosis not present

## 2021-09-05 DIAGNOSIS — Z96612 Presence of left artificial shoulder joint: Secondary | ICD-10-CM | POA: Diagnosis not present

## 2021-09-07 DIAGNOSIS — E785 Hyperlipidemia, unspecified: Secondary | ICD-10-CM | POA: Diagnosis not present

## 2021-09-07 DIAGNOSIS — M170 Bilateral primary osteoarthritis of knee: Secondary | ICD-10-CM | POA: Diagnosis not present

## 2021-09-07 DIAGNOSIS — Z0000 Encounter for general adult medical examination without abnormal findings: Secondary | ICD-10-CM | POA: Diagnosis not present

## 2021-09-07 DIAGNOSIS — Z23 Encounter for immunization: Secondary | ICD-10-CM | POA: Diagnosis not present

## 2021-09-07 DIAGNOSIS — E8881 Metabolic syndrome: Secondary | ICD-10-CM | POA: Diagnosis not present

## 2021-09-07 DIAGNOSIS — G7000 Myasthenia gravis without (acute) exacerbation: Secondary | ICD-10-CM | POA: Diagnosis not present

## 2021-09-13 DIAGNOSIS — C61 Malignant neoplasm of prostate: Secondary | ICD-10-CM | POA: Diagnosis not present

## 2021-09-19 IMAGING — DX DG CHEST 2V
2 series · 2 of 2 positions shown · non-contrast
Comparison: 08/06/2018

CLINICAL DATA: Shortness of breath, fever

EXAM:
CHEST - 2 VIEW

[chest pa]
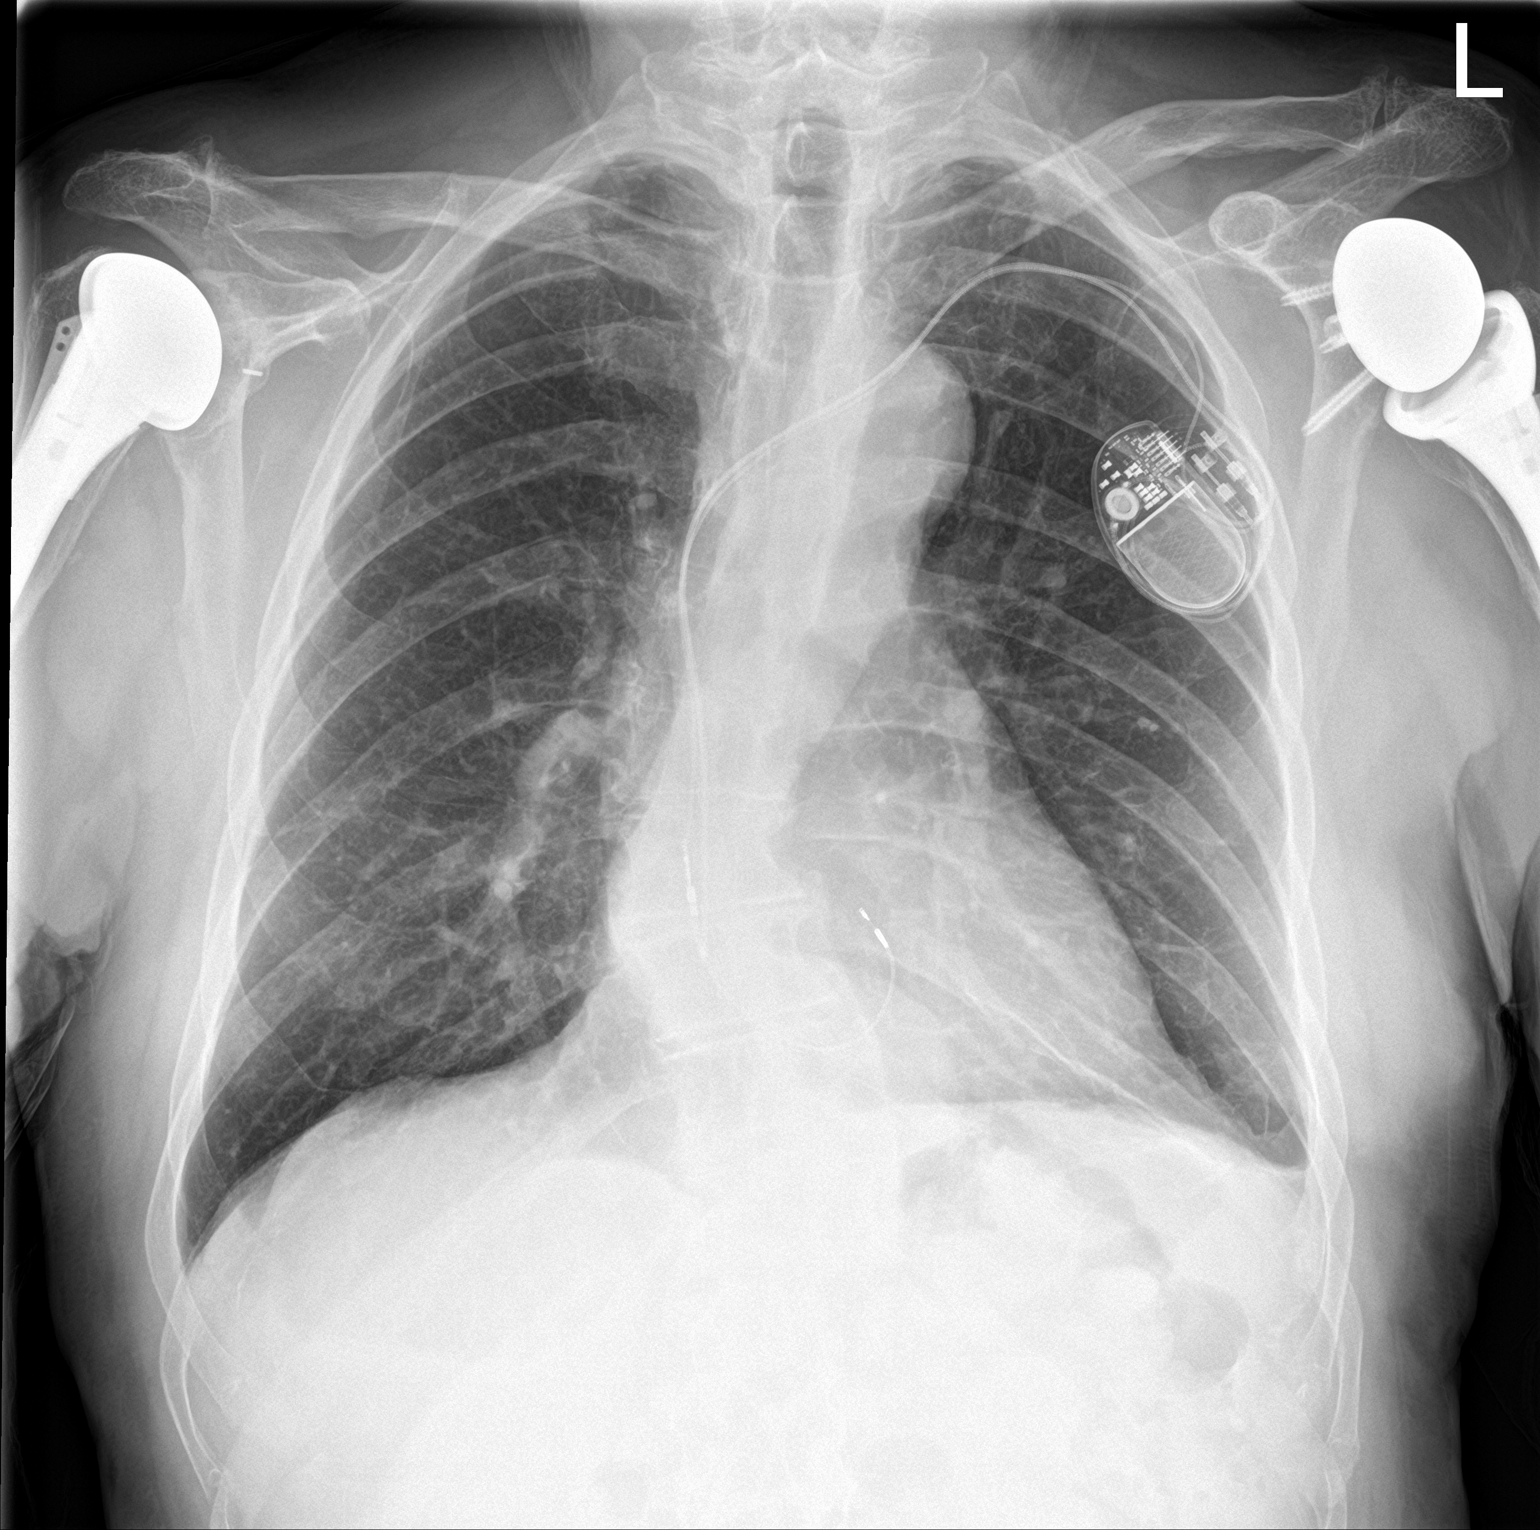

[chest lat]
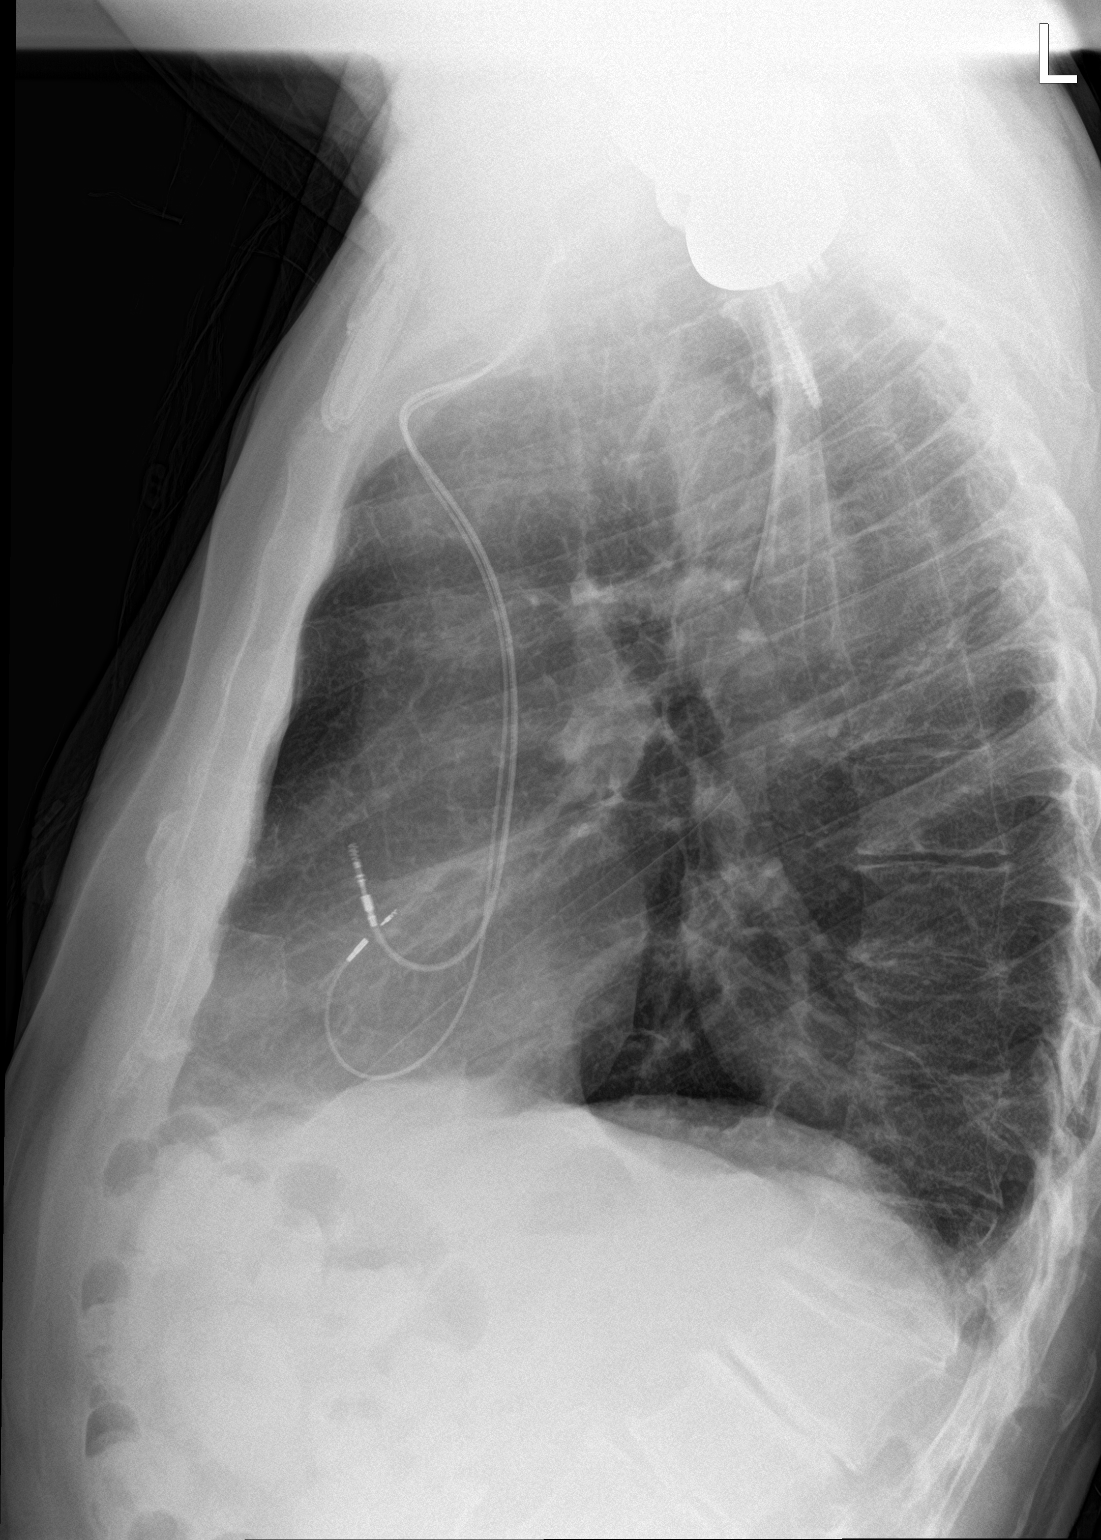

[2 of 2 positions shown; findings below may reference images not displayed]

FINDINGS: The heart size and mediastinal contours are within normal limits.
Left chest multi lead pacer. Both lungs are clear. Status post
bilateral shoulder arthroplasty. Disc degenerative disease of the
thoracic spine.
IMPRESSION: No acute abnormality of the lungs.

## 2021-09-20 IMAGING — US US ABDOMEN LIMITED
1 series · 14 of 25 positions shown · non-contrast
Comparison: None.

CLINICAL DATA: Transaminitis

EXAM:
ULTRASOUND ABDOMEN LIMITED RIGHT UPPER QUADRANT

[Series 1: us abdomen limited ruq (liver/gb) · 14 of 50 slices shown]
[im 1/50]
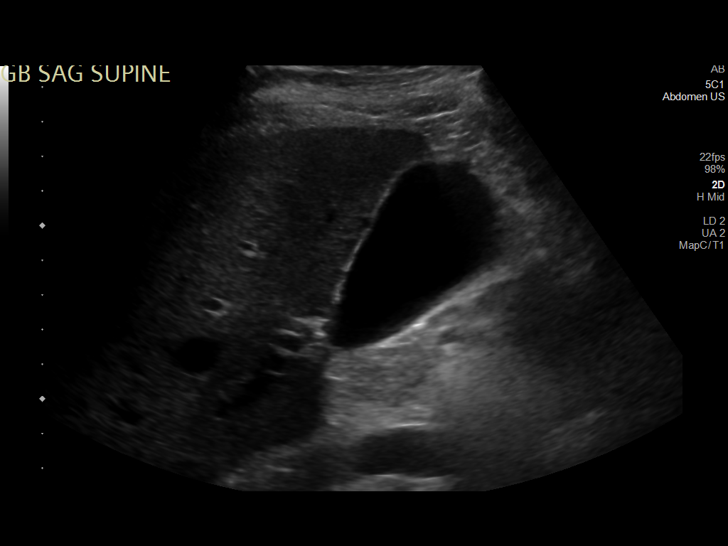
[im 5/50]
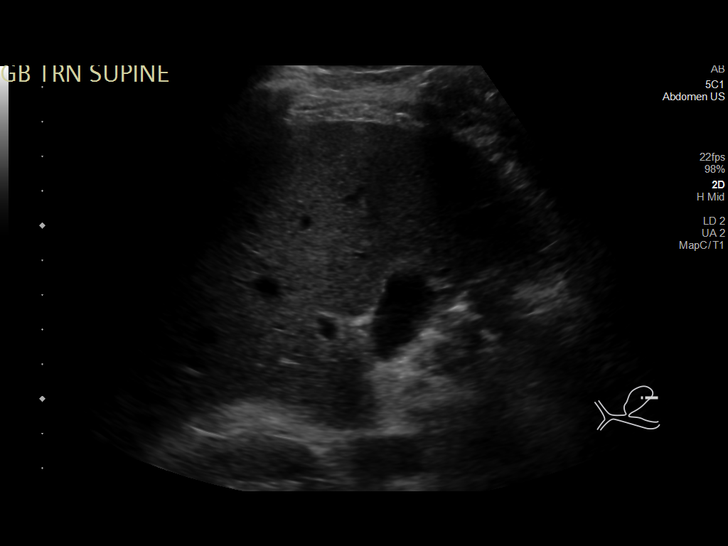
[im 9/50]
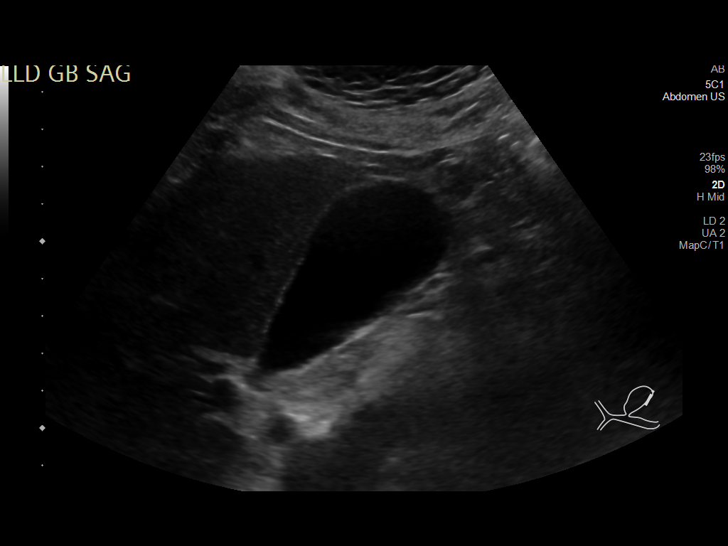
[im 13/50]
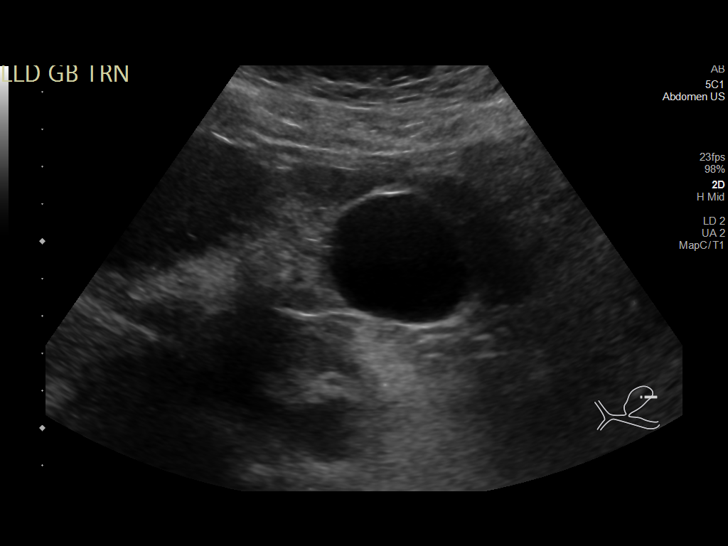
[im 17/50]
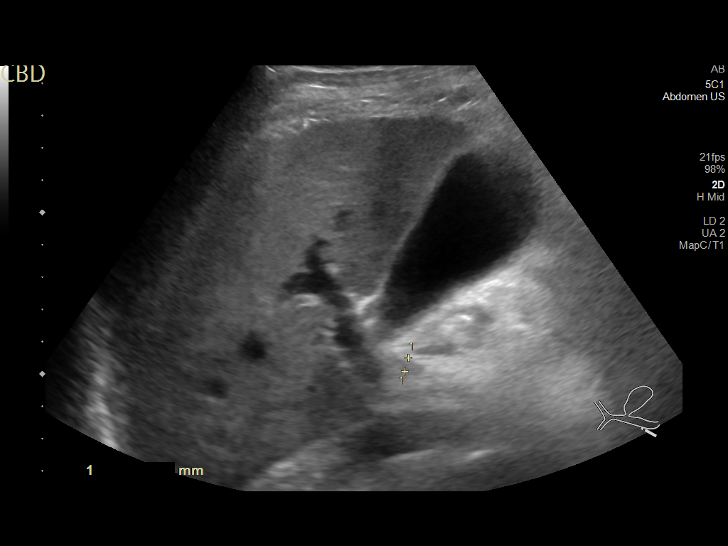
[im 19/50]
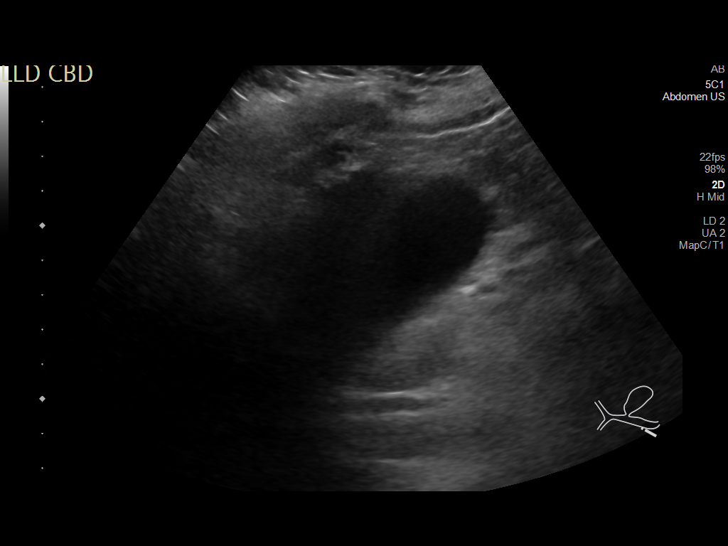
[im 23/50]
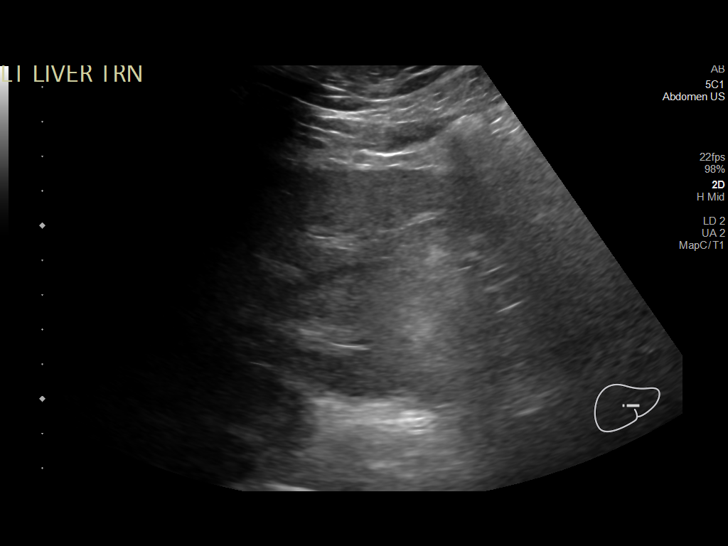
[im 27/50]
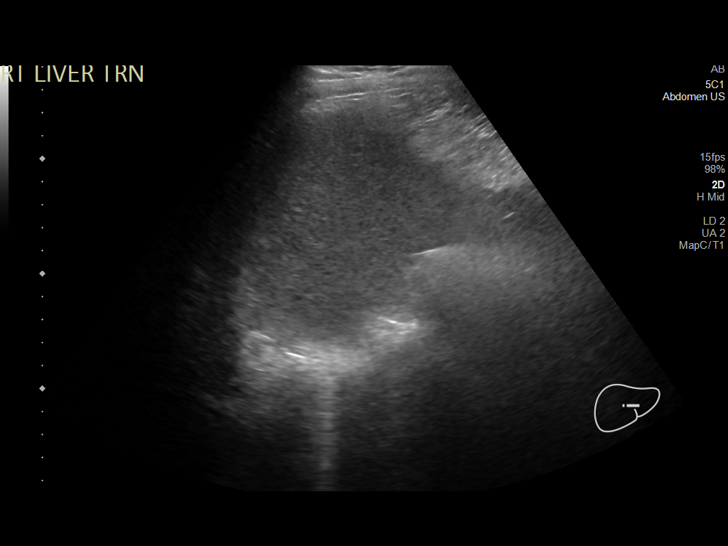
[im 31/50]
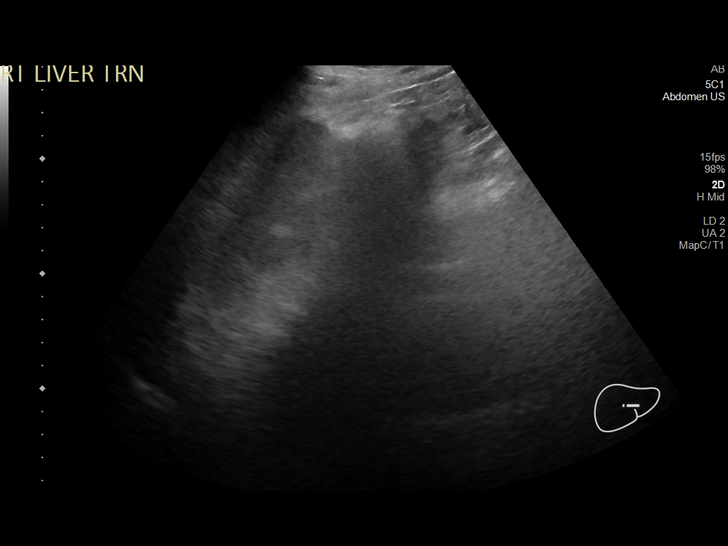
[im 33/50]
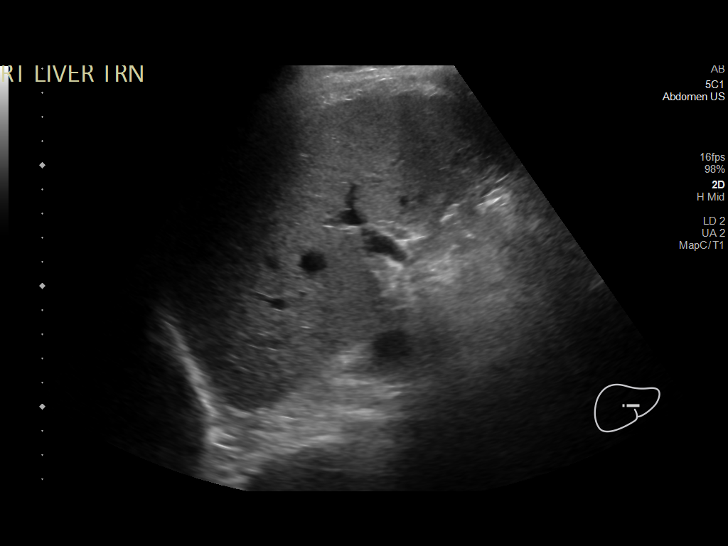
[im 37/50]
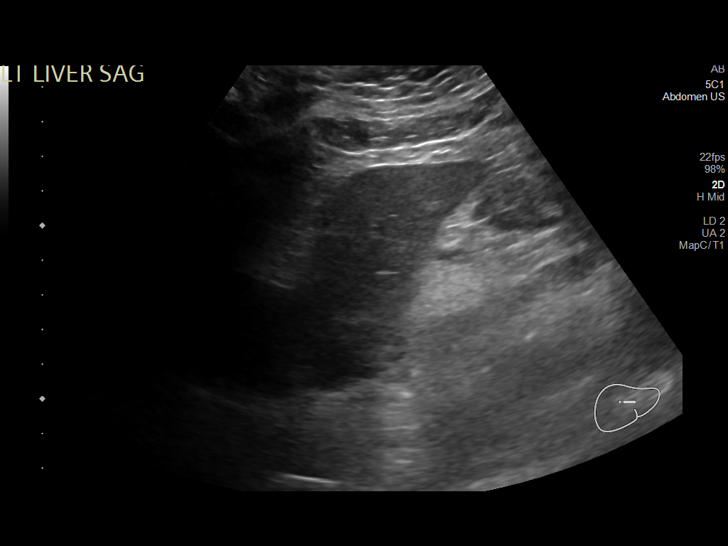
[im 41/50]
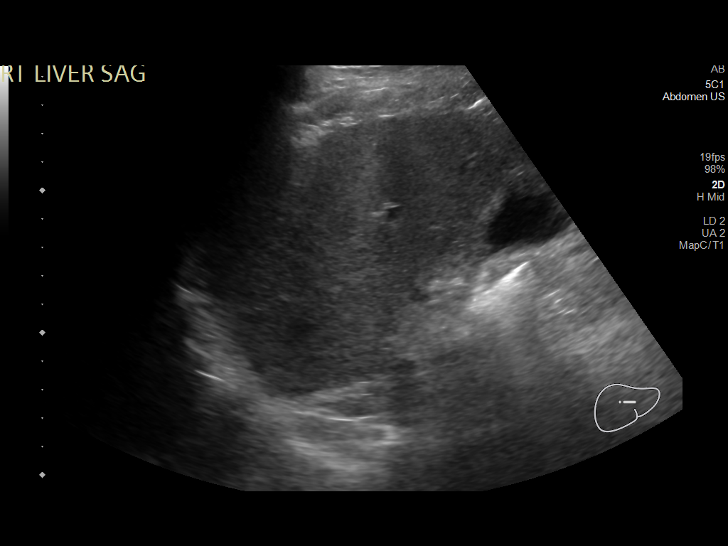
[im 45/50]
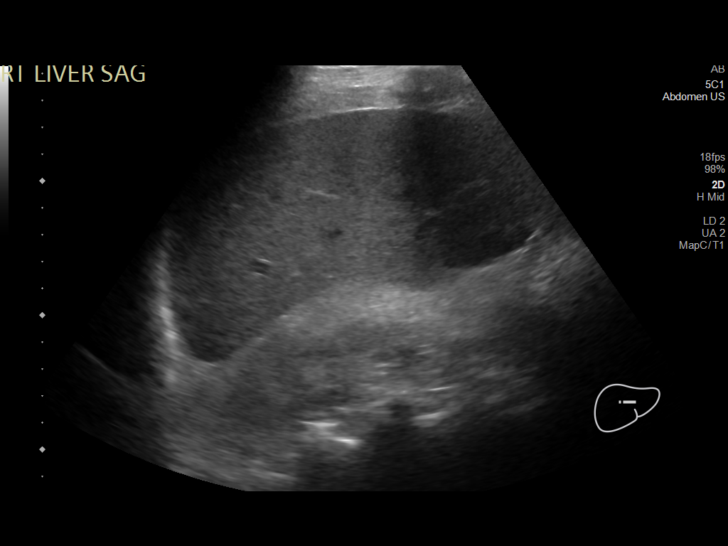
[im 50/50]
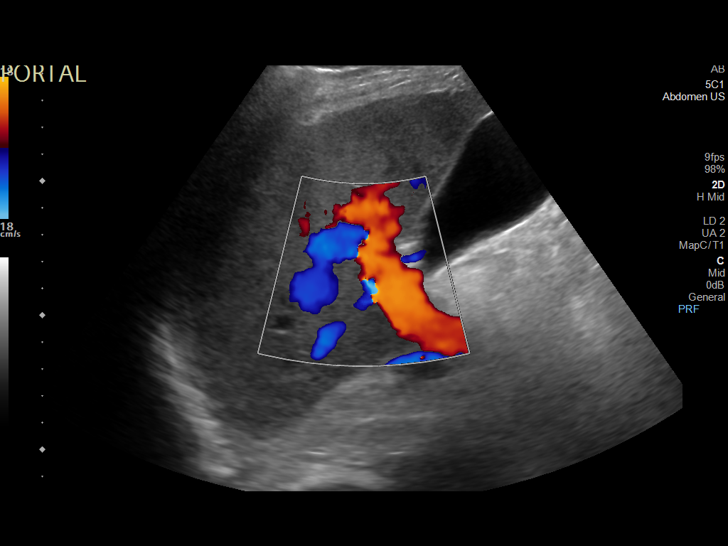

[14 of 25 positions shown; findings below may reference images not displayed]

FINDINGS: Gallbladder:

No gallstones or wall thickening visualized. No sonographic Murphy
sign noted by sonographer.

Common bile duct:

Diameter: 5 mm

Liver:

No focal lesion identified. Within normal limits in parenchymal
echogenicity. Portal vein is patent on color Doppler imaging with
normal direction of blood flow towards the liver.
IMPRESSION: Normal right upper quadrant ultrasound.

## 2021-09-20 IMAGING — CT CT ABD-PELV W/O CM
2 of 4 series · 16 of 46 positions shown, 18 images · non-contrast
Comparison: None.

CLINICAL DATA: Fever of unknown origin.

EXAM:
CT ABDOMEN AND PELVIS WITHOUT CONTRAST
TECHNIQUE: Multidetector CT imaging of the abdomen and pelvis was performed
following the standard protocol without IV contrast.

[Series 3: ap without · axial · non-contrast · 0.79mm/px · z∈[-416,-16]mm · 13 of 92 slices shown, 15 images]
[im 6/92  soft-tissue]
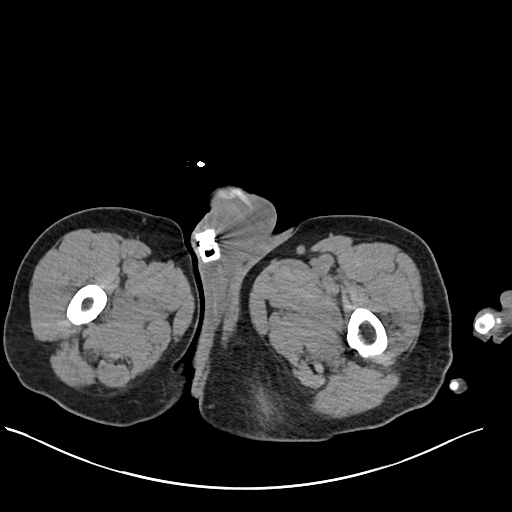
[im 6/92  bone]
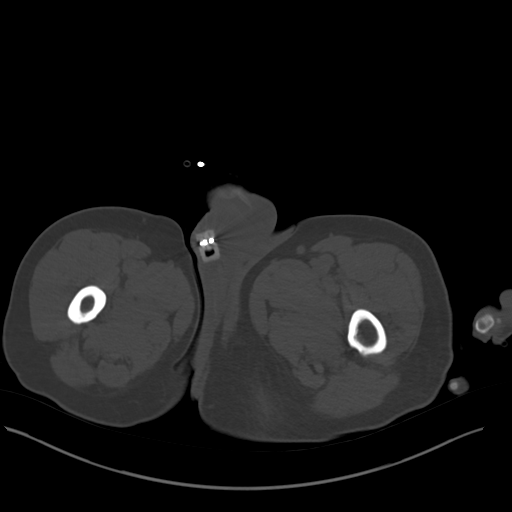
[im 11/92  soft-tissue]
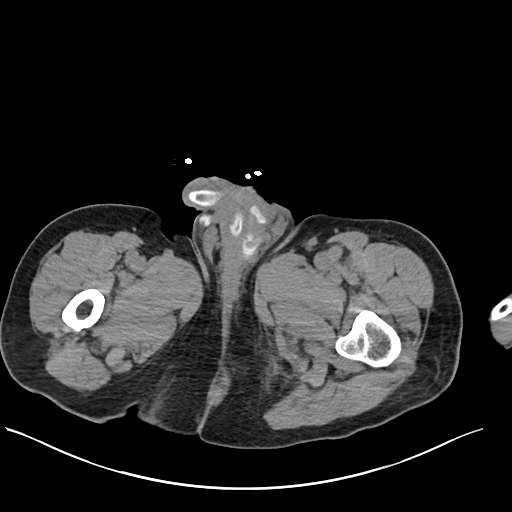
[im 22/92  soft-tissue]
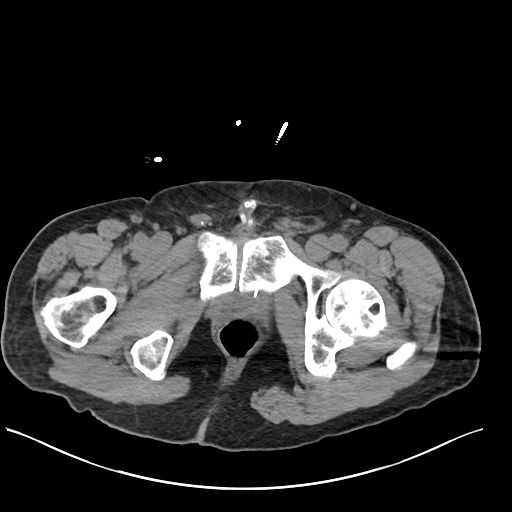
[im 27/92  soft-tissue]
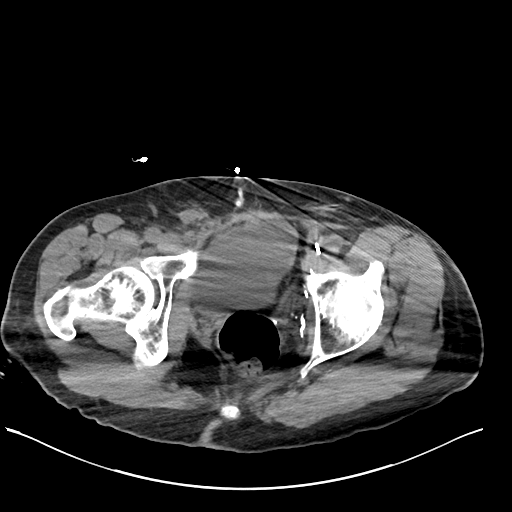
[im 33/92  soft-tissue]
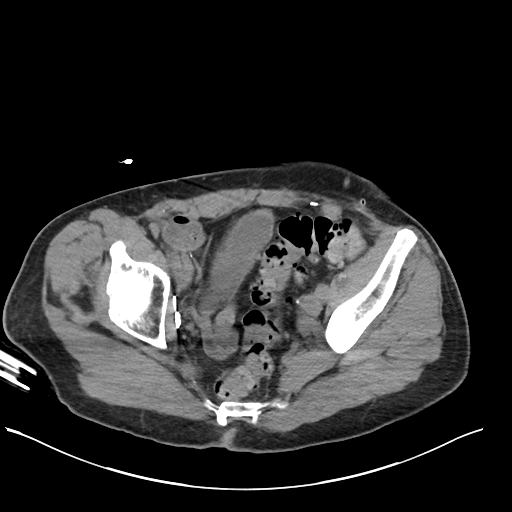
[im 38/92  soft-tissue]
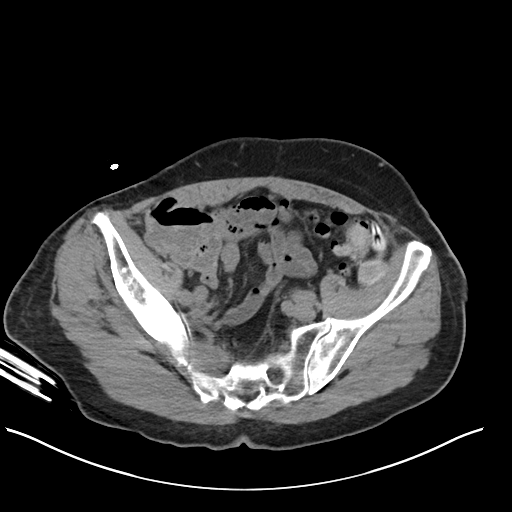
[im 49/92  soft-tissue]
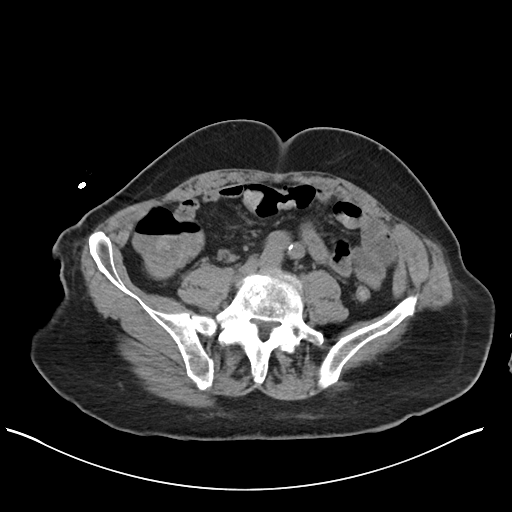
[im 54/92  soft-tissue]
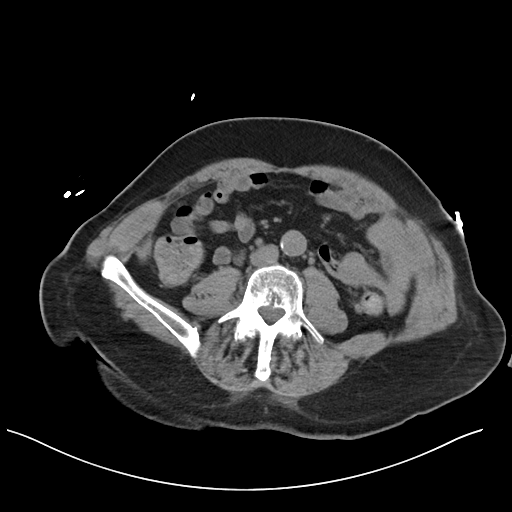
[im 59/92  soft-tissue]
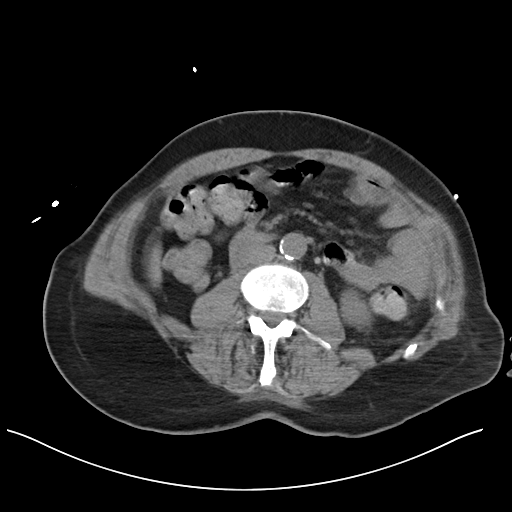
[im 59/92  bone]
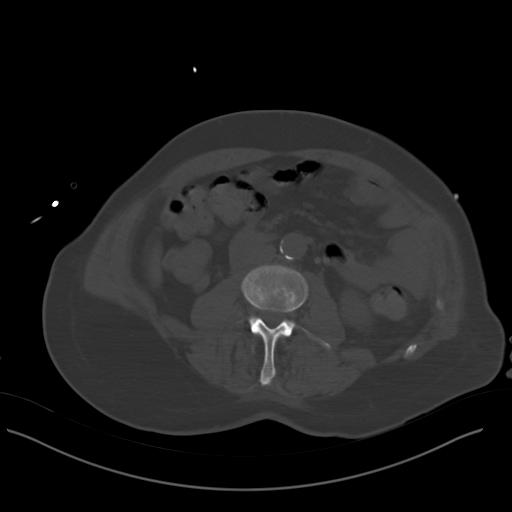
[im 65/92  soft-tissue]
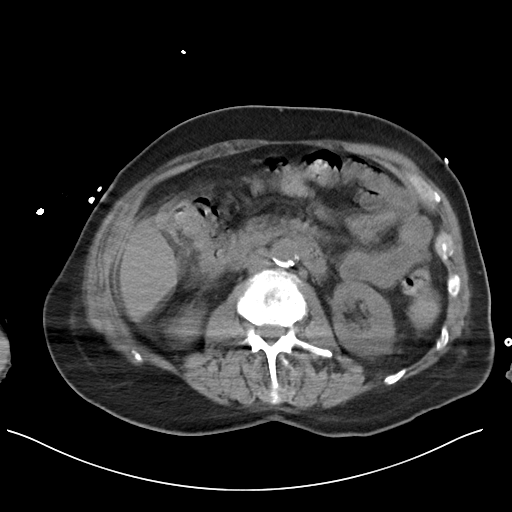
[im 70/92  soft-tissue]
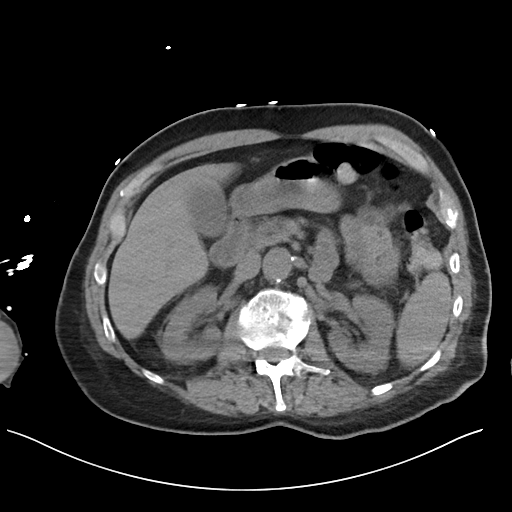
[im 81/92  soft-tissue]
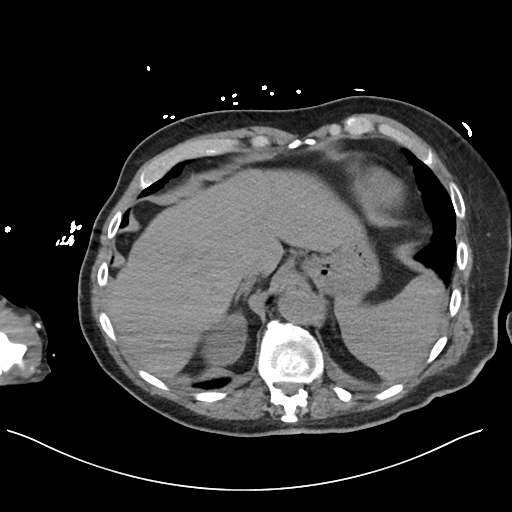
[im 86/92  soft-tissue]
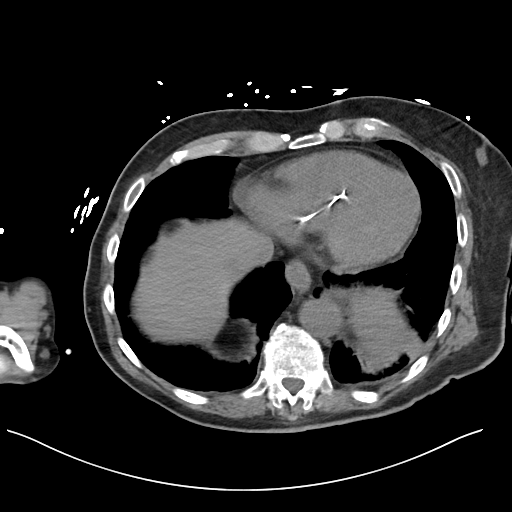

[Series 6: cor · coronal · 0.76mm/px · 3 of 97 slices shown]
[im 33/97  soft-tissue]
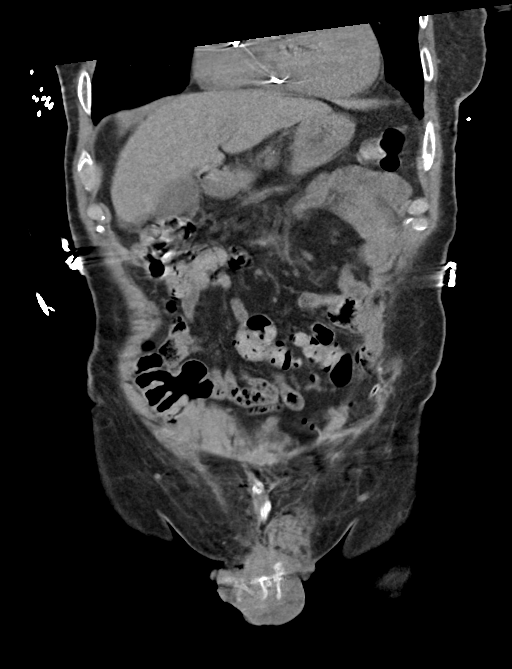
[im 43/97  soft-tissue]
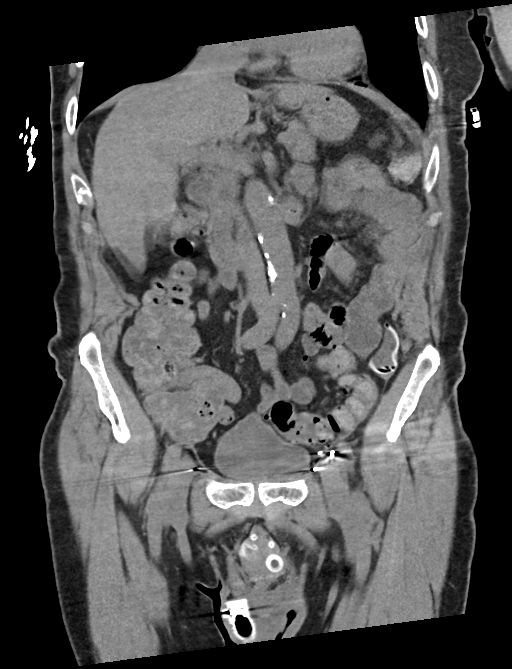
[im 54/97  soft-tissue]
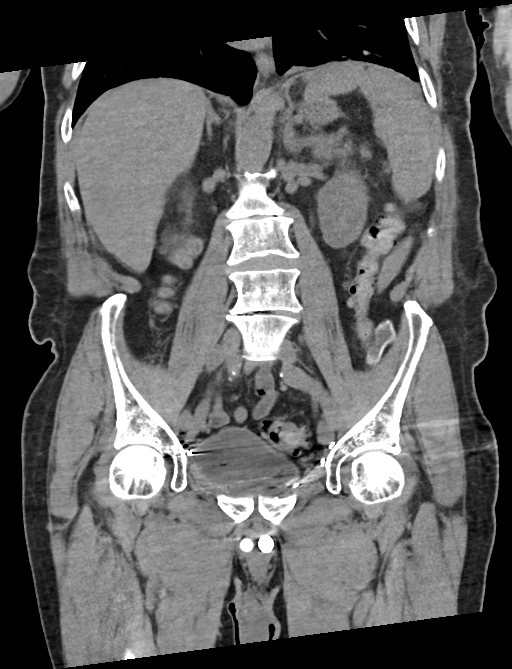

[16 of 46 positions shown; findings below may reference images not displayed]

FINDINGS: Lower chest: Subsegmental atelectasis noted dependent lung bases.

Hepatobiliary: No focal abnormality in the liver on this study
without intravenous contrast. There is no evidence for gallstones,
gallbladder wall thickening, or pericholecystic fluid. No
intrahepatic or extrahepatic biliary dilation.

Pancreas: No focal mass lesion. No dilatation of the main duct. No
intraparenchymal cyst. No peripancreatic edema.

Spleen: No splenomegaly. No focal mass lesion.

Adrenals/Urinary Tract: No adrenal nodule or mass. Kidneys
unremarkable. No evidence for hydroureter. The urinary bladder
appears normal for the degree of distention.

Stomach/Bowel: Stomach is nondistended. Duodenum is normally
positioned as is the ligament of Treitz. No small bowel wall
thickening. No small bowel dilatation. The terminal ileum is normal.
The appendix is best seen on coronal images and is unremarkable. No
gross colonic mass. No colonic wall thickening. Diverticular changes
are noted in the left colon without evidence of diverticulitis.

Vascular/Lymphatic: There is moderate atherosclerotic calcification
of the abdominal aorta without aneurysm. There is no gastrohepatic
or hepatoduodenal ligament lymphadenopathy. No retroperitoneal or
mesenteric lymphadenopathy. No pelvic sidewall lymphadenopathy.

Reproductive: Prostate gland surgically absent. Penile prosthetic
device evident.

Other: No intraperitoneal free fluid.

Musculoskeletal: No worrisome lytic or sclerotic osseous
abnormality.
IMPRESSION: 1. Study with no acute findings in the abdomen or pelvis.
Specifically, no findings to explain the patient's history of fever.
2. Left colonic diverticulosis without diverticulitis.
3. Aortic Atherosclerosis (RRDS5-OEC.C).

Motion degraded

## 2021-10-22 DIAGNOSIS — Z79899 Other long term (current) drug therapy: Secondary | ICD-10-CM | POA: Diagnosis not present

## 2021-10-22 DIAGNOSIS — Z950 Presence of cardiac pacemaker: Secondary | ICD-10-CM | POA: Diagnosis not present

## 2021-10-22 DIAGNOSIS — Z5181 Encounter for therapeutic drug level monitoring: Secondary | ICD-10-CM | POA: Diagnosis not present

## 2021-10-22 DIAGNOSIS — G7000 Myasthenia gravis without (acute) exacerbation: Secondary | ICD-10-CM | POA: Diagnosis not present

## 2021-11-14 ENCOUNTER — Ambulatory Visit (INDEPENDENT_AMBULATORY_CARE_PROVIDER_SITE_OTHER): Payer: Medicare Other

## 2021-11-14 DIAGNOSIS — I442 Atrioventricular block, complete: Secondary | ICD-10-CM

## 2021-11-14 LAB — CUP PACEART REMOTE DEVICE CHECK
Battery Remaining Longevity: 56 mo
Battery Remaining Percentage: 50 %
Battery Voltage: 2.99 V
Brady Statistic AP VP Percent: 9.8 %
Brady Statistic AP VS Percent: 22 %
Brady Statistic AS VP Percent: 20 %
Brady Statistic AS VS Percent: 47 %
Brady Statistic RA Percent Paced: 31 %
Brady Statistic RV Percent Paced: 30 %
Date Time Interrogation Session: 20221214020013
Implantable Lead Implant Date: 20190904
Implantable Lead Implant Date: 20190904
Implantable Lead Location: 753859
Implantable Lead Location: 753860
Implantable Lead Model: 3830
Implantable Lead Model: 5076
Implantable Pulse Generator Implant Date: 20190904
Lead Channel Impedance Value: 390 Ohm
Lead Channel Impedance Value: 480 Ohm
Lead Channel Pacing Threshold Amplitude: 0.5 V
Lead Channel Pacing Threshold Amplitude: 0.875 V
Lead Channel Pacing Threshold Pulse Width: 0.5 ms
Lead Channel Pacing Threshold Pulse Width: 1 ms
Lead Channel Sensing Intrinsic Amplitude: 2.1 mV
Lead Channel Sensing Intrinsic Amplitude: 5 mV
Lead Channel Setting Pacing Amplitude: 1.125
Lead Channel Setting Pacing Amplitude: 2 V
Lead Channel Setting Pacing Pulse Width: 1 ms
Lead Channel Setting Sensing Sensitivity: 2 mV
Pulse Gen Model: 2272
Pulse Gen Serial Number: 3873687

## 2021-11-27 NOTE — Progress Notes (Signed)
Remote pacemaker transmission.   

## 2022-01-01 DIAGNOSIS — Z8719 Personal history of other diseases of the digestive system: Secondary | ICD-10-CM | POA: Diagnosis not present

## 2022-01-01 DIAGNOSIS — Z800 Family history of malignant neoplasm of digestive organs: Secondary | ICD-10-CM | POA: Diagnosis not present

## 2022-01-10 DIAGNOSIS — M48061 Spinal stenosis, lumbar region without neurogenic claudication: Secondary | ICD-10-CM | POA: Diagnosis not present

## 2022-01-10 DIAGNOSIS — M25551 Pain in right hip: Secondary | ICD-10-CM | POA: Diagnosis not present

## 2022-01-10 DIAGNOSIS — M545 Low back pain, unspecified: Secondary | ICD-10-CM | POA: Diagnosis not present

## 2022-01-14 DIAGNOSIS — K5730 Diverticulosis of large intestine without perforation or abscess without bleeding: Secondary | ICD-10-CM | POA: Diagnosis not present

## 2022-01-14 DIAGNOSIS — Z86010 Personal history of colonic polyps: Secondary | ICD-10-CM | POA: Diagnosis not present

## 2022-01-14 DIAGNOSIS — Z800 Family history of malignant neoplasm of digestive organs: Secondary | ICD-10-CM | POA: Diagnosis not present

## 2022-01-14 DIAGNOSIS — Z1211 Encounter for screening for malignant neoplasm of colon: Secondary | ICD-10-CM | POA: Diagnosis not present

## 2022-01-15 DIAGNOSIS — R2689 Other abnormalities of gait and mobility: Secondary | ICD-10-CM | POA: Diagnosis not present

## 2022-01-15 DIAGNOSIS — M25551 Pain in right hip: Secondary | ICD-10-CM | POA: Diagnosis not present

## 2022-01-15 DIAGNOSIS — M545 Low back pain, unspecified: Secondary | ICD-10-CM | POA: Diagnosis not present

## 2022-01-15 DIAGNOSIS — I471 Supraventricular tachycardia: Secondary | ICD-10-CM | POA: Insufficient documentation

## 2022-01-17 ENCOUNTER — Other Ambulatory Visit: Payer: Self-pay

## 2022-01-17 ENCOUNTER — Encounter: Payer: Self-pay | Admitting: Internal Medicine

## 2022-01-17 ENCOUNTER — Ambulatory Visit (INDEPENDENT_AMBULATORY_CARE_PROVIDER_SITE_OTHER): Payer: Medicare Other | Admitting: Internal Medicine

## 2022-01-17 VITALS — BP 172/94 | HR 70 | Ht 67.5 in | Wt 172.4 lb

## 2022-01-17 DIAGNOSIS — I4719 Other supraventricular tachycardia: Secondary | ICD-10-CM

## 2022-01-17 DIAGNOSIS — I471 Supraventricular tachycardia: Secondary | ICD-10-CM | POA: Diagnosis not present

## 2022-01-17 DIAGNOSIS — Z950 Presence of cardiac pacemaker: Secondary | ICD-10-CM

## 2022-01-17 DIAGNOSIS — M25551 Pain in right hip: Secondary | ICD-10-CM | POA: Diagnosis not present

## 2022-01-17 DIAGNOSIS — I442 Atrioventricular block, complete: Secondary | ICD-10-CM | POA: Diagnosis not present

## 2022-01-17 DIAGNOSIS — M545 Low back pain, unspecified: Secondary | ICD-10-CM | POA: Diagnosis not present

## 2022-01-17 DIAGNOSIS — R2689 Other abnormalities of gait and mobility: Secondary | ICD-10-CM | POA: Diagnosis not present

## 2022-01-17 NOTE — Progress Notes (Signed)
Patient Care Team: Street, Sharon Mt, MD as PCP - General (Family Medicine) Deboraha Sprang, MD as PCP - Cardiology (Cardiology)   HPI  Kyle Bowen is a 76 y.o. male Seen in follow-up for pacemaker implanted 9/19 for complete heart block.  RV apical pacing with both passive and active leads were associate with complex ventricular ectopy; hence, the lead was moved to the HIS Position     Has Myasthenia gravis; in remission i.e. does not know he has not  The patient denies chest pain , nocturnal dyspnea, orthopnea or peripheral edema.  There have been no palpitations, lightheadedness or syncope.  Complains of some shortness of breath with bending and also notes shortness of breath with climbing stairs not withstanding the fact that he is much more limited because of hip pain that he had no..    Date Cr K Hgb  9/22 1.03 3.9 14.4  11/22    14.0     Past Medical History:  Diagnosis Date   Arthritis    Cancer ([place]) 91   prostate    Complete heart block ([place]) 5/57/3220   Complication of anesthesia    occ bp drops after surgery whrn getting up    GERD (gastroesophageal reflux disease)    occ   Heart block    History of myasthenia gravis    History of prostate cancer 08/01/2018   Myasthenia ([place])    Myasthenia gravis without (acute) exacerbation ([place]) 03/06/2016   Presence of permanent cardiac pacemaker    S/P shoulder replacement 10/14/2014    Past Surgical History:  Procedure Laterality Date   CATARACT EXTRACTION     HERNIA REPAIR Left 05   inguinal   JOINT REPLACEMENT Left 2016   Surgery was in [place] N/A 08/05/2018   Procedure: PACEMAKER IMPLANT - Dual Chamber;  Surgeon: Deboraha Sprang, MD;  Location: [facility];  Service: Cardiovascular;  Laterality: N/A;   PENILE PROSTHESIS IMPLANT  93   00.08 replaced   PROSTATE SURGERY  91   ca   TONSILLECTOMY     TOTAL SHOULDER ARTHROPLASTY Right 10/14/2014   Procedure: RIGHT TOTAL  SHOULDER ARTHROPLASTY;  Surgeon: Augustin Schooling, MD;  Location: [facility];  Service: Orthopedics;  Laterality: Right;   TOTAL SHOULDER ARTHROPLASTY Left 09/03/2019   Procedure: Anatomic TOTAL SHOULDER ARTHROPLASTY;  Surgeon: Netta Cedars, MD;  Location: [facility];  Service: Orthopedics;  Laterality: Left;  with interscalene block    Current Meds  Medication Sig   aspirin 81 MG tablet Take 81 mg by mouth at bedtime.   meloxicam (MOBIC) 15 MG tablet Take 15 mg by mouth daily.   mycophenolate (CELLCEPT) 500 MG tablet Take 1,500 mg by mouth See admin instructions. 2000 mg in the morning 1000 mg at bedtime   pantoprazole (PROTONIX) 40 MG tablet Take 40 mg by mouth 2 (two) times daily.   pyridostigmine (MESTINON) 60 MG tablet Take 60 mg by mouth daily as needed (difficulty with tongue).    simvastatin (ZOCOR) 40 MG tablet Take 20 mg by mouth daily.    Allergies  Allergen Reactions   Aluminum-Containing Compounds Other (See Comments)    Myasthenic patient    Aminoglycosides Other (See Comments)    Myasthenic patient    Azithromycin Other (See Comments)    Myasthenic patient    Botulinum Toxins Other (See Comments)    Myasthenic patient    Calcium Channel Blockers Other (See Comments)  Myasthenic patient    Ciprofloxacin Other (See Comments)    Myasthenic patient    Colistin Other (See Comments)    Myasthenic patient    D Tubocurarine [Tubocurarine] Other (See Comments)    Myasthenic patient    Erythromycin Base Other (See Comments)    Myasthenic patient    Gentamicin Other (See Comments)    Myasthenic patient    Iodinated Contrast Media Other (See Comments)    Myasthenic patient    Kanamycin Other (See Comments)    Myasthenic patient    Macrolides And Ketolides Other (See Comments)    Myasthenic patient    Magnesium-Containing Compounds Other (See Comments)    Myasthenic patient    Moxifloxacin Other (See Comments)    Myasthenic patient    Neomycin Other (See Comments)     Myasthenic patient    Norfloxacin Other (See Comments)    Myasthenic patient    Ofloxacin Other (See Comments)    Myasthenic patient    Pefloxacin Other (See Comments)    Myasthenic patient    Penicillamine Other (See Comments)    Myasthenic patient    Procainamide Other (See Comments)    Myasthenic patient    Propranolol Other (See Comments)    Myasthenic patient    Quinidine Other (See Comments)    Myasthenic patient    Quinine Derivatives Other (See Comments)    Myasthenic patient    Simethicone Other (See Comments)    Myasthenic patient    Streptomycin Other (See Comments)    Myasthenic patient    Succinylcholine Other (See Comments)    Myasthenic patient    Telithromycin Other (See Comments)    Myasthenic patient    Timolol Other (See Comments)    Myasthenic patient    Tobramycin Other (See Comments)    Myasthenic patient    Vecuronium Other (See Comments)    Myasthenic patient       Review of Systems negative except from HPI and PMH  Physical Exam BP (!) 172/94    Pulse 70    Ht 5' 7.5" (1.715 m)    Wt 172 lb 6.4 oz (78.2 kg)    SpO2 96%    BMI 26.60 kg/m  Well developed and well nourished in no acute distress HENT normal Neck supple with JVP-flat Clear Device pocket well healed; without hematoma or erythema.  There is no tethering  Regular rate and rhythm, no murmur Abd-soft with active BS No Clubbing cyanosis  edema Skin-warm and dry A & Oriented  Grossly normal sensory and motor function  ECG sinus at 70 Interval 17/11/39 Left axis deviation of -64   Assessment and  Plan   Complete heart block-intermittent  Atrial tachycardia  Pacemaker-Saint Jude-His  Blood pressure is elevated today.  Not normal.  We will follow this up at home.  On aspirin, for 20 years.  No known cardiovascular disease.  We will discontinue.  He does take a statin.  We will continue on Zocor 40.  At this juncture he is not interested in pursuing further evaluation of  his dyspnea.  This not withstanding the fact that his dyspnea has emerged even if he has had increasing orthopedic limitations.

## 2022-01-17 NOTE — Patient Instructions (Signed)
Medication Instructions:  Your physician has recommended you make the following change in your medication:   ** Stop taking your Aspirin  *If you need a refill on your cardiac medications before your next appointment, please call your pharmacy*   Lab Work: None ordered.  If you have labs (blood work) drawn today and your tests are completely normal, you will receive your results only by: [place] (if you have MyChart) OR A paper copy in the mail If you have any lab test that is abnormal or we need to change your treatment, we will call you to review the results.   Testing/Procedures: None ordered.    Follow-Up: At [facility], you and your health needs are our priority.  As part of our continuing mission to provide you with exceptional heart care, we have created designated Provider Care Teams.  These Care Teams include your primary Cardiologist (physician) and Advanced Practice Providers (APPs -  Physician Assistants and Nurse Practitioners) who all work together to provide you with the care you need, when you need it.  We recommend signing up for the patient portal called "MyChart".  Sign up information is provided on this After Visit Summary.  MyChart is used to connect with patients for Virtual Visits (Telemedicine).  Patients are able to view lab/test results, encounter notes, upcoming appointments, etc.  Non-urgent messages can be sent to your provider as well.   To learn more about what you can do with MyChart, go to [email or web address].    Your next appointment:   12 months with Dr Olin Pia PA

## 2022-01-22 DIAGNOSIS — M25551 Pain in right hip: Secondary | ICD-10-CM | POA: Diagnosis not present

## 2022-01-22 DIAGNOSIS — R2689 Other abnormalities of gait and mobility: Secondary | ICD-10-CM | POA: Diagnosis not present

## 2022-01-22 DIAGNOSIS — M545 Low back pain, unspecified: Secondary | ICD-10-CM | POA: Diagnosis not present

## 2022-01-23 DIAGNOSIS — M25551 Pain in right hip: Secondary | ICD-10-CM | POA: Diagnosis not present

## 2022-01-25 DIAGNOSIS — R2689 Other abnormalities of gait and mobility: Secondary | ICD-10-CM | POA: Diagnosis not present

## 2022-01-25 DIAGNOSIS — M545 Low back pain, unspecified: Secondary | ICD-10-CM | POA: Diagnosis not present

## 2022-01-25 DIAGNOSIS — M25551 Pain in right hip: Secondary | ICD-10-CM | POA: Diagnosis not present

## 2022-01-29 DIAGNOSIS — M25551 Pain in right hip: Secondary | ICD-10-CM | POA: Diagnosis not present

## 2022-01-29 DIAGNOSIS — R2689 Other abnormalities of gait and mobility: Secondary | ICD-10-CM | POA: Diagnosis not present

## 2022-01-29 DIAGNOSIS — M545 Low back pain, unspecified: Secondary | ICD-10-CM | POA: Diagnosis not present

## 2022-02-01 DIAGNOSIS — M25551 Pain in right hip: Secondary | ICD-10-CM | POA: Diagnosis not present

## 2022-02-01 DIAGNOSIS — R2689 Other abnormalities of gait and mobility: Secondary | ICD-10-CM | POA: Diagnosis not present

## 2022-02-01 DIAGNOSIS — M545 Low back pain, unspecified: Secondary | ICD-10-CM | POA: Diagnosis not present

## 2022-02-04 DIAGNOSIS — R2689 Other abnormalities of gait and mobility: Secondary | ICD-10-CM | POA: Diagnosis not present

## 2022-02-04 DIAGNOSIS — M545 Low back pain, unspecified: Secondary | ICD-10-CM | POA: Diagnosis not present

## 2022-02-04 DIAGNOSIS — M25551 Pain in right hip: Secondary | ICD-10-CM | POA: Diagnosis not present

## 2022-02-06 DIAGNOSIS — R2689 Other abnormalities of gait and mobility: Secondary | ICD-10-CM | POA: Diagnosis not present

## 2022-02-06 DIAGNOSIS — M545 Low back pain, unspecified: Secondary | ICD-10-CM | POA: Diagnosis not present

## 2022-02-06 DIAGNOSIS — M25551 Pain in right hip: Secondary | ICD-10-CM | POA: Diagnosis not present

## 2022-02-07 DIAGNOSIS — M25551 Pain in right hip: Secondary | ICD-10-CM | POA: Diagnosis not present

## 2022-02-11 DIAGNOSIS — M25551 Pain in right hip: Secondary | ICD-10-CM | POA: Diagnosis not present

## 2022-02-11 DIAGNOSIS — R2689 Other abnormalities of gait and mobility: Secondary | ICD-10-CM | POA: Diagnosis not present

## 2022-02-11 DIAGNOSIS — M545 Low back pain, unspecified: Secondary | ICD-10-CM | POA: Diagnosis not present

## 2022-02-13 ENCOUNTER — Ambulatory Visit (INDEPENDENT_AMBULATORY_CARE_PROVIDER_SITE_OTHER): Payer: Medicare Other

## 2022-02-13 DIAGNOSIS — I442 Atrioventricular block, complete: Secondary | ICD-10-CM | POA: Diagnosis not present

## 2022-02-13 LAB — CUP PACEART REMOTE DEVICE CHECK
Battery Remaining Longevity: 57 mo
Battery Remaining Percentage: 48 %
Battery Voltage: 2.99 V
Brady Statistic AP VP Percent: 1.1 %
Brady Statistic AP VS Percent: 22 %
Brady Statistic AS VP Percent: 2.1 %
Brady Statistic AS VS Percent: 74 %
Brady Statistic RA Percent Paced: 22 %
Brady Statistic RV Percent Paced: 3.2 %
Date Time Interrogation Session: 20230315020014
Implantable Lead Implant Date: 20190904
Implantable Lead Implant Date: 20190904
Implantable Lead Location: 753859
Implantable Lead Location: 753860
Implantable Lead Model: 3830
Implantable Lead Model: 5076
Implantable Pulse Generator Implant Date: 20190904
Lead Channel Impedance Value: 400 Ohm
Lead Channel Impedance Value: 490 Ohm
Lead Channel Pacing Threshold Amplitude: 0.5 V
Lead Channel Pacing Threshold Amplitude: 0.875 V
Lead Channel Pacing Threshold Pulse Width: 0.5 ms
Lead Channel Pacing Threshold Pulse Width: 1 ms
Lead Channel Sensing Intrinsic Amplitude: 3.6 mV
Lead Channel Sensing Intrinsic Amplitude: 5 mV
Lead Channel Setting Pacing Amplitude: 1.125
Lead Channel Setting Pacing Amplitude: 2 V
Lead Channel Setting Pacing Pulse Width: 1 ms
Lead Channel Setting Sensing Sensitivity: 2 mV
Pulse Gen Model: 2272
Pulse Gen Serial Number: 3873687

## 2022-02-14 DIAGNOSIS — M25551 Pain in right hip: Secondary | ICD-10-CM | POA: Diagnosis not present

## 2022-02-14 DIAGNOSIS — R2689 Other abnormalities of gait and mobility: Secondary | ICD-10-CM | POA: Diagnosis not present

## 2022-02-14 DIAGNOSIS — M545 Low back pain, unspecified: Secondary | ICD-10-CM | POA: Diagnosis not present

## 2022-02-18 DIAGNOSIS — M25551 Pain in right hip: Secondary | ICD-10-CM | POA: Diagnosis not present

## 2022-02-18 DIAGNOSIS — M545 Low back pain, unspecified: Secondary | ICD-10-CM | POA: Diagnosis not present

## 2022-02-18 DIAGNOSIS — R2689 Other abnormalities of gait and mobility: Secondary | ICD-10-CM | POA: Diagnosis not present

## 2022-02-19 DIAGNOSIS — R0989 Other specified symptoms and signs involving the circulatory and respiratory systems: Secondary | ICD-10-CM | POA: Diagnosis not present

## 2022-02-19 DIAGNOSIS — E663 Overweight: Secondary | ICD-10-CM | POA: Diagnosis not present

## 2022-02-19 DIAGNOSIS — I442 Atrioventricular block, complete: Secondary | ICD-10-CM | POA: Diagnosis not present

## 2022-02-19 DIAGNOSIS — G7000 Myasthenia gravis without (acute) exacerbation: Secondary | ICD-10-CM | POA: Diagnosis not present

## 2022-02-21 DIAGNOSIS — M25551 Pain in right hip: Secondary | ICD-10-CM | POA: Diagnosis not present

## 2022-02-21 DIAGNOSIS — R2689 Other abnormalities of gait and mobility: Secondary | ICD-10-CM | POA: Diagnosis not present

## 2022-02-21 DIAGNOSIS — M545 Low back pain, unspecified: Secondary | ICD-10-CM | POA: Diagnosis not present

## 2022-02-25 DIAGNOSIS — M545 Low back pain, unspecified: Secondary | ICD-10-CM | POA: Diagnosis not present

## 2022-02-25 DIAGNOSIS — R2689 Other abnormalities of gait and mobility: Secondary | ICD-10-CM | POA: Diagnosis not present

## 2022-02-25 DIAGNOSIS — M25551 Pain in right hip: Secondary | ICD-10-CM | POA: Diagnosis not present

## 2022-02-25 NOTE — Progress Notes (Signed)
Remote pacemaker transmission.   

## 2022-02-28 DIAGNOSIS — R2689 Other abnormalities of gait and mobility: Secondary | ICD-10-CM | POA: Diagnosis not present

## 2022-02-28 DIAGNOSIS — M545 Low back pain, unspecified: Secondary | ICD-10-CM | POA: Diagnosis not present

## 2022-02-28 DIAGNOSIS — M25551 Pain in right hip: Secondary | ICD-10-CM | POA: Diagnosis not present

## 2022-03-04 DIAGNOSIS — M545 Low back pain, unspecified: Secondary | ICD-10-CM | POA: Diagnosis not present

## 2022-03-04 DIAGNOSIS — R2689 Other abnormalities of gait and mobility: Secondary | ICD-10-CM | POA: Diagnosis not present

## 2022-03-04 DIAGNOSIS — M25551 Pain in right hip: Secondary | ICD-10-CM | POA: Diagnosis not present

## 2022-03-11 DIAGNOSIS — R2689 Other abnormalities of gait and mobility: Secondary | ICD-10-CM | POA: Diagnosis not present

## 2022-03-11 DIAGNOSIS — M545 Low back pain, unspecified: Secondary | ICD-10-CM | POA: Diagnosis not present

## 2022-03-11 DIAGNOSIS — M25551 Pain in right hip: Secondary | ICD-10-CM | POA: Diagnosis not present

## 2022-04-01 DIAGNOSIS — Z6826 Body mass index (BMI) 26.0-26.9, adult: Secondary | ICD-10-CM | POA: Diagnosis not present

## 2022-04-01 DIAGNOSIS — E663 Overweight: Secondary | ICD-10-CM | POA: Diagnosis not present

## 2022-04-01 DIAGNOSIS — E785 Hyperlipidemia, unspecified: Secondary | ICD-10-CM | POA: Diagnosis not present

## 2022-04-01 DIAGNOSIS — R0989 Other specified symptoms and signs involving the circulatory and respiratory systems: Secondary | ICD-10-CM | POA: Diagnosis not present

## 2022-05-15 ENCOUNTER — Ambulatory Visit (INDEPENDENT_AMBULATORY_CARE_PROVIDER_SITE_OTHER): Payer: Medicare Other

## 2022-05-15 DIAGNOSIS — I442 Atrioventricular block, complete: Secondary | ICD-10-CM | POA: Diagnosis not present

## 2022-05-17 LAB — CUP PACEART REMOTE DEVICE CHECK
Battery Remaining Longevity: 50 mo
Battery Remaining Percentage: 45 %
Battery Voltage: 2.99 V
Brady Statistic AP VP Percent: 10 %
Brady Statistic AP VS Percent: 19 %
Brady Statistic AS VP Percent: 8 %
Brady Statistic AS VS Percent: 63 %
Brady Statistic RA Percent Paced: 28 %
Brady Statistic RV Percent Paced: 18 %
Date Time Interrogation Session: 20230614020013
Implantable Lead Implant Date: 20190904
Implantable Lead Implant Date: 20190904
Implantable Lead Location: 753859
Implantable Lead Location: 753860
Implantable Lead Model: 3830
Implantable Lead Model: 5076
Implantable Pulse Generator Implant Date: 20190904
Lead Channel Impedance Value: 380 Ohm
Lead Channel Impedance Value: 460 Ohm
Lead Channel Pacing Threshold Amplitude: 0.5 V
Lead Channel Pacing Threshold Amplitude: 1 V
Lead Channel Pacing Threshold Pulse Width: 0.5 ms
Lead Channel Pacing Threshold Pulse Width: 1 ms
Lead Channel Sensing Intrinsic Amplitude: 2.7 mV
Lead Channel Sensing Intrinsic Amplitude: 5 mV
Lead Channel Setting Pacing Amplitude: 1.25 V
Lead Channel Setting Pacing Amplitude: 2 V
Lead Channel Setting Pacing Pulse Width: 1 ms
Lead Channel Setting Sensing Sensitivity: 2 mV
Pulse Gen Model: 2272
Pulse Gen Serial Number: 3873687

## 2022-06-05 DIAGNOSIS — L821 Other seborrheic keratosis: Secondary | ICD-10-CM | POA: Diagnosis not present

## 2022-06-05 DIAGNOSIS — L578 Other skin changes due to chronic exposure to nonionizing radiation: Secondary | ICD-10-CM | POA: Diagnosis not present

## 2022-06-05 DIAGNOSIS — C44219 Basal cell carcinoma of skin of left ear and external auricular canal: Secondary | ICD-10-CM | POA: Diagnosis not present

## 2022-06-05 DIAGNOSIS — L814 Other melanin hyperpigmentation: Secondary | ICD-10-CM | POA: Diagnosis not present

## 2022-06-05 DIAGNOSIS — L570 Actinic keratosis: Secondary | ICD-10-CM | POA: Diagnosis not present

## 2022-07-13 DIAGNOSIS — C44219 Basal cell carcinoma of skin of left ear and external auricular canal: Secondary | ICD-10-CM | POA: Diagnosis not present

## 2022-08-14 ENCOUNTER — Ambulatory Visit (INDEPENDENT_AMBULATORY_CARE_PROVIDER_SITE_OTHER): Payer: Medicare Other

## 2022-08-14 DIAGNOSIS — I442 Atrioventricular block, complete: Secondary | ICD-10-CM | POA: Diagnosis not present

## 2022-08-15 LAB — CUP PACEART REMOTE DEVICE CHECK
Battery Remaining Longevity: 47 mo
Battery Remaining Percentage: 43 %
Battery Voltage: 2.98 V
Brady Statistic AP VP Percent: 14 %
Brady Statistic AP VS Percent: 16 %
Brady Statistic AS VP Percent: 9.6 %
Brady Statistic AS VS Percent: 59 %
Brady Statistic RA Percent Paced: 29 %
Brady Statistic RV Percent Paced: 24 %
Date Time Interrogation Session: 20230913020016
Implantable Lead Implant Date: 20190904
Implantable Lead Implant Date: 20190904
Implantable Lead Location: 753859
Implantable Lead Location: 753860
Implantable Lead Model: 3830
Implantable Lead Model: 5076
Implantable Pulse Generator Implant Date: 20190904
Lead Channel Impedance Value: 390 Ohm
Lead Channel Impedance Value: 460 Ohm
Lead Channel Pacing Threshold Amplitude: 0.5 V
Lead Channel Pacing Threshold Amplitude: 1 V
Lead Channel Pacing Threshold Pulse Width: 0.5 ms
Lead Channel Pacing Threshold Pulse Width: 1 ms
Lead Channel Sensing Intrinsic Amplitude: 2.7 mV
Lead Channel Sensing Intrinsic Amplitude: 5 mV
Lead Channel Setting Pacing Amplitude: 1.25 V
Lead Channel Setting Pacing Amplitude: 2 V
Lead Channel Setting Pacing Pulse Width: 1 ms
Lead Channel Setting Sensing Sensitivity: 2 mV
Pulse Gen Model: 2272
Pulse Gen Serial Number: 3873687

## 2022-08-27 DIAGNOSIS — Z23 Encounter for immunization: Secondary | ICD-10-CM | POA: Diagnosis not present

## 2022-08-29 NOTE — Progress Notes (Signed)
Remote pacemaker transmission.   

## 2022-09-02 DIAGNOSIS — Z79899 Other long term (current) drug therapy: Secondary | ICD-10-CM | POA: Diagnosis not present

## 2022-09-02 DIAGNOSIS — E785 Hyperlipidemia, unspecified: Secondary | ICD-10-CM | POA: Diagnosis not present

## 2022-09-09 DIAGNOSIS — I442 Atrioventricular block, complete: Secondary | ICD-10-CM | POA: Diagnosis not present

## 2022-09-09 DIAGNOSIS — R0989 Other specified symptoms and signs involving the circulatory and respiratory systems: Secondary | ICD-10-CM | POA: Diagnosis not present

## 2022-09-09 DIAGNOSIS — Z0000 Encounter for general adult medical examination without abnormal findings: Secondary | ICD-10-CM | POA: Diagnosis not present

## 2022-09-09 DIAGNOSIS — E785 Hyperlipidemia, unspecified: Secondary | ICD-10-CM | POA: Diagnosis not present

## 2022-09-09 DIAGNOSIS — Z23 Encounter for immunization: Secondary | ICD-10-CM | POA: Diagnosis not present

## 2022-09-09 DIAGNOSIS — G7000 Myasthenia gravis without (acute) exacerbation: Secondary | ICD-10-CM | POA: Diagnosis not present

## 2022-09-26 DIAGNOSIS — C61 Malignant neoplasm of prostate: Secondary | ICD-10-CM | POA: Diagnosis not present

## 2022-10-15 DIAGNOSIS — C44219 Basal cell carcinoma of skin of left ear and external auricular canal: Secondary | ICD-10-CM | POA: Diagnosis not present

## 2022-10-28 DIAGNOSIS — G7000 Myasthenia gravis without (acute) exacerbation: Secondary | ICD-10-CM | POA: Diagnosis not present

## 2022-10-28 DIAGNOSIS — Z79899 Other long term (current) drug therapy: Secondary | ICD-10-CM | POA: Diagnosis not present

## 2022-11-13 ENCOUNTER — Ambulatory Visit (INDEPENDENT_AMBULATORY_CARE_PROVIDER_SITE_OTHER): Payer: Medicare Other

## 2022-11-13 DIAGNOSIS — I442 Atrioventricular block, complete: Secondary | ICD-10-CM | POA: Diagnosis not present

## 2022-11-13 LAB — CUP PACEART REMOTE DEVICE CHECK
Battery Remaining Longevity: 44 mo
Battery Remaining Percentage: 40 %
Battery Voltage: 2.98 V
Brady Statistic AP VP Percent: 17 %
Brady Statistic AP VS Percent: 15 %
Brady Statistic AS VP Percent: 11 %
Brady Statistic AS VS Percent: 56 %
Brady Statistic RA Percent Paced: 30 %
Brady Statistic RV Percent Paced: 28 %
Date Time Interrogation Session: 20231213020013
Implantable Lead Connection Status: 753985
Implantable Lead Connection Status: 753985
Implantable Lead Implant Date: 20190904
Implantable Lead Implant Date: 20190904
Implantable Lead Location: 753859
Implantable Lead Location: 753860
Implantable Lead Model: 3830
Implantable Lead Model: 5076
Implantable Pulse Generator Implant Date: 20190904
Lead Channel Impedance Value: 390 Ohm
Lead Channel Impedance Value: 440 Ohm
Lead Channel Pacing Threshold Amplitude: 0.5 V
Lead Channel Pacing Threshold Amplitude: 0.75 V
Lead Channel Pacing Threshold Pulse Width: 0.5 ms
Lead Channel Pacing Threshold Pulse Width: 1 ms
Lead Channel Sensing Intrinsic Amplitude: 2.3 mV
Lead Channel Sensing Intrinsic Amplitude: 5 mV
Lead Channel Setting Pacing Amplitude: 1 V
Lead Channel Setting Pacing Amplitude: 2 V
Lead Channel Setting Pacing Pulse Width: 1 ms
Lead Channel Setting Sensing Sensitivity: 2 mV
Pulse Gen Model: 2272
Pulse Gen Serial Number: 3873687

## 2022-12-10 NOTE — Progress Notes (Signed)
Remote pacemaker transmission.   

## 2023-01-13 ENCOUNTER — Encounter: Payer: Self-pay | Admitting: Internal Medicine

## 2023-01-13 ENCOUNTER — Ambulatory Visit: Payer: Medicare Other | Attending: Internal Medicine | Admitting: Internal Medicine

## 2023-01-13 VITALS — BP 122/74 | HR 61 | Ht 67.0 in | Wt 182.4 lb

## 2023-01-13 DIAGNOSIS — I442 Atrioventricular block, complete: Secondary | ICD-10-CM | POA: Diagnosis not present

## 2023-01-13 DIAGNOSIS — I4719 Other supraventricular tachycardia: Secondary | ICD-10-CM | POA: Diagnosis not present

## 2023-01-13 DIAGNOSIS — Z950 Presence of cardiac pacemaker: Secondary | ICD-10-CM | POA: Diagnosis not present

## 2023-01-13 NOTE — Patient Instructions (Signed)
Medication Instructions:  Your physician recommends that you continue on your current medications as directed. Please refer to the Current Medication list given to you today.  *If you need a refill on your cardiac medications before your next appointment, please call your pharmacy*   Lab Work: None ordered.  If you have labs (blood work) drawn today and your tests are completely normal, you will receive your results only by: [place] (if you have MyChart) OR A paper copy in the mail If you have any lab test that is abnormal or we need to change your treatment, we will call you to review the results.   Testing/Procedures: None ordered.    Follow-Up: At [facility], you and your health needs are our priority.  As part of our continuing mission to provide you with exceptional heart care, we have created designated Provider Care Teams.  These Care Teams include your primary Cardiologist (physician) and Advanced Practice Providers (APPs -  Physician Assistants and Nurse Practitioners) who all work together to provide you with the care you need, when you need it.  We recommend signing up for the patient portal called "MyChart".  Sign up information is provided on this After Visit Summary.  MyChart is used to connect with patients for Virtual Visits (Telemedicine).  Patients are able to view lab/test results, encounter notes, upcoming appointments, etc.  Non-urgent messages can be sent to your provider as well.   To learn more about what you can do with MyChart, go to [email or web address].    Your next appointment:   12 months with Dr Caryl Comes

## 2023-01-13 NOTE — Progress Notes (Signed)
Patient Care Team: Street, Sharon Mt, MD as PCP - General (Family Medicine) Deboraha Sprang, MD as PCP - Cardiology (Cardiology)   HPI  Kyle Bowen is a 77 y.o. male Seen in follow-up for pacemaker implanted 9/19 for complete heart block.  RV apical pacing with both passive and active leads were associate with complex ventricular ectopy; hence, the lead was moved to the HIS Position     Has Myasthenia gravis; in remission i.e. does not know he has not  The patient denies chest pain, shortness of breath, nocturnal dyspnea, orthopnea or peripheral edema.  There have been no palpitations, lightheadedness or syncope.     Date Cr K Hgb  9/22 1.03 3.9 14.4  11/22    14.0  10/23 1.1 4.2 13.5     Past Medical History:  Diagnosis Date   Arthritis    Cancer ([place]) 89   prostate    Complete heart block ([place]) 99991111   Complication of anesthesia    occ bp drops after surgery whrn getting up    GERD (gastroesophageal reflux disease)    occ   Heart block    History of myasthenia gravis    History of prostate cancer 08/01/2018   Myasthenia ([place])    Myasthenia gravis without (acute) exacerbation ([place]) 03/06/2016   Presence of permanent cardiac pacemaker    S/P shoulder replacement 10/14/2014    Past Surgical History:  Procedure Laterality Date   CATARACT EXTRACTION     HERNIA REPAIR Left 05   inguinal   JOINT REPLACEMENT Left 2016   Surgery was in [place] 08/05/2018   Procedure: PACEMAKER IMPLANT - Dual Chamber;  Surgeon: Deboraha Sprang, MD;  Location: [facility];  Service: Cardiovascular;  Laterality: N/A;   PENILE PROSTHESIS IMPLANT  93   00.08 replaced   PROSTATE SURGERY  91   ca   TONSILLECTOMY     TOTAL SHOULDER ARTHROPLASTY Right 10/14/2014   Procedure: RIGHT TOTAL SHOULDER ARTHROPLASTY;  Surgeon: Augustin Schooling, MD;  Location: [facility];  Service: Orthopedics;  Laterality: Right;   TOTAL SHOULDER ARTHROPLASTY Left  09/03/2019   Procedure: Anatomic TOTAL SHOULDER ARTHROPLASTY;  Surgeon: Netta Cedars, MD;  Location: [facility];  Service: Orthopedics;  Laterality: Left;  with interscalene block    Current Meds  Medication Sig   losartan (COZAAR) 25 MG tablet Take 25 mg by mouth daily.   meloxicam (MOBIC) 15 MG tablet Take 15 mg by mouth daily.   mycophenolate (CELLCEPT) 500 MG tablet Take 1,500 mg by mouth See admin instructions. 2000 mg in the morning 1000 mg at bedtime   pantoprazole (PROTONIX) 40 MG tablet Take 40 mg by mouth 2 (two) times daily.   pyridostigmine (MESTINON) 60 MG tablet Take 60 mg by mouth daily as needed (difficulty with tongue).    simvastatin (ZOCOR) 40 MG tablet Take 20 mg by mouth daily.   [DISCONTINUED] aspirin 81 MG tablet Take 81 mg by mouth at bedtime.    Allergies  Allergen Reactions   Aluminum-Containing Compounds Other (See Comments)    Myasthenic patient    Aminoglycosides Other (See Comments)    Myasthenic patient    Azithromycin Other (See Comments)    Myasthenic patient    Botulinum Toxins Other (See Comments)    Myasthenic patient    Calcium Channel Blockers Other (See Comments)    Myasthenic patient    Ciprofloxacin Other (See Comments)  Myasthenic patient    Colistin Other (See Comments)    Myasthenic patient    D Tubocurarine [Tubocurarine] Other (See Comments)    Myasthenic patient    Erythromycin Base Other (See Comments)    Myasthenic patient    Gentamicin Other (See Comments)    Myasthenic patient    Iodinated Contrast Media Other (See Comments)    Myasthenic patient    Kanamycin Other (See Comments)    Myasthenic patient    Macrolides And Ketolides Other (See Comments)    Myasthenic patient    Magnesium-Containing Compounds Other (See Comments)    Myasthenic patient    Moxifloxacin Other (See Comments)    Myasthenic patient    Neomycin Other (See Comments)    Myasthenic patient    Norfloxacin Other (See Comments)    Myasthenic patient     Ofloxacin Other (See Comments)    Myasthenic patient    Pefloxacin Other (See Comments)    Myasthenic patient    Penicillamine Other (See Comments)    Myasthenic patient    Procainamide Other (See Comments)    Myasthenic patient    Propranolol Other (See Comments)    Myasthenic patient    Quinidine Other (See Comments)    Myasthenic patient    Quinine Derivatives Other (See Comments)    Myasthenic patient    Simethicone Other (See Comments)    Myasthenic patient    Streptomycin Other (See Comments)    Myasthenic patient    Succinylcholine Other (See Comments)    Myasthenic patient    Telithromycin Other (See Comments)    Myasthenic patient    Timolol Other (See Comments)    Myasthenic patient    Tobramycin Other (See Comments)    Myasthenic patient    Vecuronium Other (See Comments)    Myasthenic patient       Review of Systems negative except from HPI and PMH  Physical Exam BP 122/74   Pulse 61   Ht 5' 7"$  (1.702 m)   Wt 182 lb 6.4 oz (82.7 kg)   SpO2 98%   BMI 28.57 kg/m  Well developed and well nourished in no acute distress HENT normal Neck supple with JVP-flat Clear Device pocket well healed; without hematoma or erythema.  There is no tethering  Regular rate and rhythm, no  gallop No  murmur Abd-soft with active BS No Clubbing cyanosis  edema Skin-warm and dry A & Oriented  Grossly normal sensory and motor function  ECG atrial pacing at 61-intervals 18/10/43 RSR prime Left axis deviation  Device function is abnormal.  Decreasing amplitude of the R wave and  elevated thresholds Programming changes the sensitivity increased from 2-1 mV; length and AV delays  See Paceart for details    Assessment and  Plan Complete heart block-intermittent  Atrial tachycardia  Sinus node dysfunction  Pacemaker-[employer] Jude-His  Ventricular lead deterioration with decreasing R wave amplitude and increasing pacing threshold  Myasthenia gravis    The patient  has normal device function in the atrium and deteriorating function of his RV lead.  However, there is notable that he uses his ventricular pacing lead only it is lower rate limit.  To this end, we have increased his AV delays to promote intrinsic conduction and thereby energy utilization of his device.  I reviewed this with him and that this is unfortunately common with His bundle pacing.  The fact that he does not use his RV lead significantly and hopefully even less as we have reprogrammed  his AV delays we will not need to correct this lead issue  No interval atrial tachycardia.

## 2023-01-15 LAB — CUP PACEART INCLINIC DEVICE CHECK
Battery Remaining Longevity: 45 mo
Brady Statistic RA Percent Paced: 30 %
Brady Statistic RV Percent Paced: 30 %
Date Time Interrogation Session: 20240212134415
Implantable Lead Connection Status: 753985
Implantable Lead Connection Status: 753985
Implantable Lead Implant Date: 20190904
Implantable Lead Implant Date: 20190904
Implantable Lead Location: 753859
Implantable Lead Location: 753860
Implantable Lead Model: 3830
Implantable Lead Model: 5076
Implantable Pulse Generator Implant Date: 20190904
Lead Channel Impedance Value: 380 Ohm
Lead Channel Impedance Value: 460 Ohm
Lead Channel Pacing Threshold Amplitude: 0.5 V
Lead Channel Pacing Threshold Amplitude: 1 V
Lead Channel Pacing Threshold Amplitude: 1 V
Lead Channel Pacing Threshold Pulse Width: 0.5 ms
Lead Channel Pacing Threshold Pulse Width: 1 ms
Lead Channel Pacing Threshold Pulse Width: 1 ms
Lead Channel Sensing Intrinsic Amplitude: 2.4 mV
Lead Channel Sensing Intrinsic Amplitude: 5 mV
Pulse Gen Model: 2272
Pulse Gen Serial Number: 3873687

## 2023-02-11 ENCOUNTER — Emergency Department (HOSPITAL_COMMUNITY): Payer: Medicare Other

## 2023-02-11 ENCOUNTER — Observation Stay (HOSPITAL_COMMUNITY)
Admission: EM | Admit: 2023-02-11 | Discharge: 2023-02-12 | Disposition: A | Discharge status: Home / Self Care | Payer: Medicare Other | Attending: Internal Medicine | Admitting: Internal Medicine

## 2023-02-11 ENCOUNTER — Other Ambulatory Visit: Payer: Self-pay

## 2023-02-11 ENCOUNTER — Encounter (HOSPITAL_COMMUNITY): Payer: Self-pay

## 2023-02-11 DIAGNOSIS — Z950 Presence of cardiac pacemaker: Secondary | ICD-10-CM | POA: Insufficient documentation

## 2023-02-11 DIAGNOSIS — U071 COVID-19: Secondary | ICD-10-CM | POA: Diagnosis not present

## 2023-02-11 DIAGNOSIS — E8721 Acute metabolic acidosis: Secondary | ICD-10-CM | POA: Insufficient documentation

## 2023-02-11 DIAGNOSIS — N179 Acute kidney failure, unspecified: Secondary | ICD-10-CM | POA: Diagnosis not present

## 2023-02-11 DIAGNOSIS — E876 Hypokalemia: Secondary | ICD-10-CM | POA: Diagnosis not present

## 2023-02-11 DIAGNOSIS — R61 Generalized hyperhidrosis: Secondary | ICD-10-CM | POA: Diagnosis not present

## 2023-02-11 DIAGNOSIS — Z96611 Presence of right artificial shoulder joint: Secondary | ICD-10-CM | POA: Diagnosis not present

## 2023-02-11 DIAGNOSIS — I4581 Long QT syndrome: Secondary | ICD-10-CM | POA: Diagnosis not present

## 2023-02-11 DIAGNOSIS — E785 Hyperlipidemia, unspecified: Secondary | ICD-10-CM

## 2023-02-11 DIAGNOSIS — R55 Syncope and collapse: Secondary | ICD-10-CM | POA: Diagnosis not present

## 2023-02-11 DIAGNOSIS — I442 Atrioventricular block, complete: Secondary | ICD-10-CM | POA: Diagnosis present

## 2023-02-11 DIAGNOSIS — Z8669 Personal history of other diseases of the nervous system and sense organs: Secondary | ICD-10-CM

## 2023-02-11 DIAGNOSIS — I10 Essential (primary) hypertension: Secondary | ICD-10-CM | POA: Insufficient documentation

## 2023-02-11 DIAGNOSIS — Z96612 Presence of left artificial shoulder joint: Secondary | ICD-10-CM | POA: Insufficient documentation

## 2023-02-11 DIAGNOSIS — Z8546 Personal history of malignant neoplasm of prostate: Secondary | ICD-10-CM | POA: Insufficient documentation

## 2023-02-11 DIAGNOSIS — Z79899 Other long term (current) drug therapy: Secondary | ICD-10-CM | POA: Diagnosis not present

## 2023-02-11 LAB — CBC
HCT: 41.4 % (ref 39.0–52.0)
Hemoglobin: 13.7 g/dL (ref 13.0–17.0)
MCH: 30.9 pg (ref 26.0–34.0)
MCHC: 33.1 g/dL (ref 30.0–36.0)
MCV: 93.5 fL (ref 80.0–100.0)
Platelets: 168 10*3/uL (ref 150–400)
RBC: 4.43 MIL/uL (ref 4.22–5.81)
RDW: 12.6 % (ref 11.5–15.5)
WBC: 9.8 10*3/uL (ref 4.0–10.5)
nRBC: 0 % (ref 0.0–0.2)

## 2023-02-11 LAB — BASIC METABOLIC PANEL
Anion gap: 11 (ref 5–15)
BUN: 21 mg/dL (ref 8–23)
CO2: 20 mmol/L — ABNORMAL LOW (ref 22–32)
Calcium: 8.9 mg/dL (ref 8.9–10.3)
Chloride: 105 mmol/L (ref 98–111)
Creatinine, Ser: 1.45 mg/dL — ABNORMAL HIGH (ref 0.61–1.24)
GFR, Estimated: 50 mL/min — ABNORMAL LOW (ref >60)
Glucose, Bld: 124 mg/dL — ABNORMAL HIGH (ref 70–99)
Potassium: 4.1 mmol/L (ref 3.5–5.1)
Sodium: 136 mmol/L (ref 135–145)

## 2023-02-11 LAB — URINALYSIS, ROUTINE W REFLEX MICROSCOPIC
Bilirubin Urine: NEGATIVE
Glucose, UA: NEGATIVE mg/dL
Hgb urine dipstick: NEGATIVE
Ketones, ur: 5 mg/dL — AB
Leukocytes,Ua: NEGATIVE
Nitrite: NEGATIVE
Protein, ur: NEGATIVE mg/dL
Specific Gravity, Urine: 1.012 (ref 1.005–1.030)
pH: 5 (ref 5.0–8.0)

## 2023-02-11 LAB — RESP PANEL BY RT-PCR (RSV, FLU A&B, COVID)  RVPGX2
Influenza A by PCR: NEGATIVE
Influenza B by PCR: NEGATIVE
Resp Syncytial Virus by PCR: NEGATIVE
SARS Coronavirus 2 by RT PCR: POSITIVE — AB

## 2023-02-11 LAB — TROPONIN I (HIGH SENSITIVITY)
Troponin I (High Sensitivity): 6 ng/L (ref <18)
Troponin I (High Sensitivity): 6 ng/L (ref <18)

## 2023-02-11 LAB — CBG MONITORING, ED: Glucose-Capillary: 121 mg/dL — ABNORMAL HIGH (ref 70–99)

## 2023-02-11 MED ORDER — SODIUM CHLORIDE 0.9 % IV BOLUS
1000.0000 mL | Freq: Once | INTRAVENOUS | Status: AC
  Administered 2023-02-11: 1000 mL via INTRAVENOUS

## 2023-02-11 MED ORDER — ACETAMINOPHEN 325 MG PO TABS
650.0000 mg | ORAL_TABLET | Freq: Once | ORAL | Status: AC
  Administered 2023-02-11: 650 mg via ORAL
  Filled 2023-02-11: qty 2

## 2023-02-11 NOTE — ED Notes (Signed)
Interrogated patient's pacemaker

## 2023-02-11 NOTE — ED Provider Notes (Signed)
Physical Exam  BP (!) 155/86   Pulse 87   Temp 99.1 F (37.3 C)   Resp 18   SpO2 96%   Physical Exam Vitals and nursing note reviewed.  Constitutional:      General: He is not in acute distress.    Appearance: He is well-developed. He is not ill-appearing or diaphoretic.     Comments: Resting comfortably in bed  HENT:     Head: Normocephalic and atraumatic.     Mouth/Throat:     Comments: No oral trauma Eyes:     Conjunctiva/sclera: Conjunctivae normal.  Cardiovascular:     Rate and Rhythm: Normal rate and regular rhythm.     Heart sounds: No murmur heard. Pulmonary:     Effort: Pulmonary effort is normal. No respiratory distress.     Breath sounds: Normal breath sounds. No stridor. No wheezing, rhonchi or rales.  Abdominal:     Palpations: Abdomen is soft.     Tenderness: There is no abdominal tenderness. There is no guarding.  Musculoskeletal:        General: No swelling.     Cervical back: Neck supple.     Right lower leg: No edema.     Left lower leg: No edema.  Skin:    General: Skin is warm and dry.     Capillary Refill: Capillary refill takes less than 2 seconds.  Neurological:     General: No focal deficit present.     Mental Status: He is alert and oriented to person, place, and time.  Psychiatric:        Mood and Affect: Mood normal.     Procedures  Procedures  ED Course / MDM   Clinical Course as of 02/11/23 2340  Tue Feb 11, 2023  2229 Spoke with [place] regarding pacemaker interrogation.  He did have a 38 seconds events with a few PACs that 7:06 PM.  No other events [AS]  2338 Spoke with hospitalist Dr. Claria Dice.  Will admit. [AS]    Clinical Course User Index [AS] Aryanna Shaver, [place], PA-C   Medical Decision Making Amount and/or Complexity of Data Reviewed Labs: ordered.  Risk OTC drugs. Decision regarding hospitalization.  Assumed care at shift change from Nicky Pugh, PA-C.  Please see her note for full HPI.  In short,  77 year old male presenting to the ED for evaluation of a syncopal episode.  This occurred earlier today and while he was at the infusion center with his wife.  He did have loss of bowel and bladder control at that time.  Unknown how long this incident occurred for.  Plan at the time of shift change is pending CT head and lab workup.  Will likely need admission to hospital for high risk syncope.  Spiked a fever while in the ED which was successfully treated with Tylenol, otherwise hemodynamically stable.  77 year old male with a history of complete heart block presenting to the ED for evaluation of a syncopal episode with bowel and bladder incontinence.  Lab workup significant for positive COVID, is otherwise reassuring.  Imaging of his chest and head is reassuring.  Pacemaker shows no significant events.  Unsure cause of his syncopal episode.  Question seizure due to loss of bowel and bladder function, however has no oral trauma.  COVID may be the cause of his fever.  Patient will benefit from admission due to syncopal episode of unknown cause and history of heart block.  May also benefit from EEG.  He  will be admitted to hospitalist in stable condition.  Spoke with Dr. Claria Dice with hospitalist group.  He will be admitted for syncopal episode without cause and COVID  Note: Portions of this report may have been transcribed using voice recognition software. Every effort was made to ensure accuracy; however, inadvertent computerized transcription errors may still be present.    Roylene Reason, PA-C 02/11/23 2341    Jeanell Sparrow, DO 02/15/23 2129

## 2023-02-11 NOTE — ED Triage Notes (Signed)
Patient was a visitor at an infusion clinic and became pale, diaphoretic and near syncopal. EMS reports that initial BP in 80s. IV NS started and 300 of fluid given. On arrival patient alert and oriented, denies pain and BP on arrival 130. Has felt normal the past few days and today

## 2023-02-11 NOTE — ED Provider Triage Note (Signed)
Emergency Medicine Provider Triage Evaluation Note  Kyle Bowen , a 77 y.o. male  was evaluated in triage.  Pt complains of near syncopal episode prior to arrival.  Patient notes he felt acutely lightheaded and diaphoretic while at the infusion clinic with his wife.  Upon EMS arrival patient's BP in the 80s.  Patient notes he feels much better.  Denies chest pain or shortness of breath.  He was in his normal state of health prior to episode. Pacemaker in place.  Review of Systems  Positive: Near syncope Negative: CP  Physical Exam  BP 127/88   Pulse 78   Temp 97.9 F (36.6 C) (Oral)   Resp 16   SpO2 100%  Gen:   Awake, no distress   Resp:  Normal effort  MSK:   Moves extremities without difficulty  Other:    Medical Decision Making  Medically screening exam initiated at 2:00 PM.  Appropriate orders placed.  HERMON FIERSTEIN was informed that the remainder of the evaluation will be completed by another provider, this initial triage assessment does not replace that evaluation, and the importance of remaining in the ED until their evaluation is complete.  Labs Interrogate pacemaker   Suzy Bouchard, [place] 02/11/23 1401

## 2023-02-11 NOTE — ED Provider Notes (Signed)
Brief bedside exam:   This is a 77 year old male who presents to the emergency department after possible syncopal episode.  Patient states that he was at the infusion center for his wife who is the patient.  He states that he walked outside and then came back down and sat down.  He states that he does often at some point.  During this time the wife repeatedly tried to wake him up and he was unresponsive for a brief period of time.  She states that he was pale and clammy.  When he woke up he continued to be cold, clammy, pale.  He had urinated and defecated on himself.  Nursing also evaluated him and request him to go to the ER for evaluation.  He states that he has been essentially asymptomatic other than being tired over the past few days.  He describes having "no get up and go."  He does feel mildly lightheaded.  He is denying any chest pain, shortness of breath, palpitations, nausea, vomiting or diarrhea.  He has been eating and drinking appropriately at home.  He does state that he has a history of myasthenia gravis and has not missed any doses of his Mestinon.  He also has a pacemaker for complete heart block.    Care of the patient is being handed off to [facility], PA-C at change of shift.  I have ordered basic labs including troponins, urine, glucose.  EKG, CT head and a chest x-ray.  We have also ordered orthostatic vital signs.  I anticipate that he will likely need admission for high risk syncope.  Please see his note for completion of care.     Mickie Hillier, PA-C 02/11/23 1526    Davonna Belling, MD 02/12/23 [phone]

## 2023-02-12 ENCOUNTER — Observation Stay (HOSPITAL_BASED_OUTPATIENT_CLINIC_OR_DEPARTMENT_OTHER): Payer: Medicare Other

## 2023-02-12 ENCOUNTER — Ambulatory Visit (INDEPENDENT_AMBULATORY_CARE_PROVIDER_SITE_OTHER): Payer: Medicare Other

## 2023-02-12 DIAGNOSIS — U071 COVID-19: Secondary | ICD-10-CM

## 2023-02-12 DIAGNOSIS — R55 Syncope and collapse: Secondary | ICD-10-CM | POA: Diagnosis not present

## 2023-02-12 DIAGNOSIS — Z79899 Other long term (current) drug therapy: Secondary | ICD-10-CM | POA: Diagnosis not present

## 2023-02-12 DIAGNOSIS — I442 Atrioventricular block, complete: Secondary | ICD-10-CM

## 2023-02-12 DIAGNOSIS — D84821 Immunodeficiency due to drugs: Secondary | ICD-10-CM

## 2023-02-12 DIAGNOSIS — E785 Hyperlipidemia, unspecified: Secondary | ICD-10-CM

## 2023-02-12 DIAGNOSIS — G7000 Myasthenia gravis without (acute) exacerbation: Secondary | ICD-10-CM | POA: Diagnosis not present

## 2023-02-12 LAB — ECHOCARDIOGRAM COMPLETE
AR max vel: 2.94 cm2
AV Area VTI: 2.51 cm2
AV Area mean vel: 2.62 cm2
AV Mean grad: 3 mmHg
AV Peak grad: 5.5 mmHg
Ao pk vel: 1.17 m/s
Area-P 1/2: 6.17 cm2
Est EF: 55
MV VTI: 1.96 cm2
S' Lateral: 3 cm

## 2023-02-12 LAB — BASIC METABOLIC PANEL
Anion gap: 7 (ref 5–15)
BUN: 15 mg/dL (ref 8–23)
CO2: 17 mmol/L — ABNORMAL LOW (ref 22–32)
Calcium: 7.8 mg/dL — ABNORMAL LOW (ref 8.9–10.3)
Chloride: 111 mmol/L (ref 98–111)
Creatinine, Ser: 1.2 mg/dL (ref 0.61–1.24)
GFR, Estimated: >60 mL/min (ref >60)
Glucose, Bld: 137 mg/dL — ABNORMAL HIGH (ref 70–99)
Potassium: 3.3 mmol/L — ABNORMAL LOW (ref 3.5–5.1)
Sodium: 135 mmol/L (ref 135–145)

## 2023-02-12 LAB — CBC WITH DIFFERENTIAL/PLATELET
Abs Immature Granulocytes: 0.02 10*3/uL (ref 0.00–0.07)
Basophils Absolute: 0 10*3/uL (ref 0.0–0.1)
Basophils Relative: 1 %
Eosinophils Absolute: 0 10*3/uL (ref 0.0–0.5)
Eosinophils Relative: 0 %
HCT: 35.3 % — ABNORMAL LOW (ref 39.0–52.0)
Hemoglobin: 12.1 g/dL — ABNORMAL LOW (ref 13.0–17.0)
Immature Granulocytes: 0 %
Lymphocytes Relative: 17 %
Lymphs Abs: 1.1 10*3/uL (ref 0.7–4.0)
MCH: 31.4 pg (ref 26.0–34.0)
MCHC: 34.3 g/dL (ref 30.0–36.0)
MCV: 91.7 fL (ref 80.0–100.0)
Monocytes Absolute: 0.9 10*3/uL (ref 0.1–1.0)
Monocytes Relative: 14 %
Neutro Abs: 4.3 10*3/uL (ref 1.7–7.7)
Neutrophils Relative %: 68 %
Platelets: 131 10*3/uL — ABNORMAL LOW (ref 150–400)
RBC: 3.85 MIL/uL — ABNORMAL LOW (ref 4.22–5.81)
RDW: 12.7 % (ref 11.5–15.5)
WBC: 6.3 10*3/uL (ref 4.0–10.5)
nRBC: 0 % (ref 0.0–0.2)

## 2023-02-12 LAB — TSH: TSH: 0.636 u[IU]/mL (ref 0.350–4.500)

## 2023-02-12 LAB — CK: Total CK: 125 U/L (ref 49–397)

## 2023-02-12 MED ORDER — ACETAMINOPHEN 325 MG PO TABS: 650.0000 mg | ORAL_TABLET | Freq: Four times a day (QID) | ORAL | Status: DC | PRN

## 2023-02-12 MED ORDER — SENNOSIDES-DOCUSATE SODIUM 8.6-50 MG PO TABS: 1.0000 | ORAL_TABLET | Freq: Every evening | ORAL | Status: DC | PRN

## 2023-02-12 MED ORDER — MYCOPHENOLATE MOFETIL 500 MG PO TABS: 1500.0000 mg | ORAL_TABLET | ORAL | Status: DC

## 2023-02-12 MED ORDER — FOLIC ACID 1 MG PO TABS
1.0000 mg | ORAL_TABLET | Freq: Every day | ORAL | Status: DC
  Administered 2023-02-12: 1 mg via ORAL
  Filled 2023-02-12: qty 1

## 2023-02-12 MED ORDER — ACETAMINOPHEN 650 MG RE SUPP: 650.0000 mg | Freq: Four times a day (QID) | RECTAL | Status: DC | PRN

## 2023-02-12 MED ORDER — PROMETHAZINE HCL 25 MG PO TABS: 12.5000 mg | ORAL_TABLET | Freq: Four times a day (QID) | ORAL | Status: DC | PRN

## 2023-02-12 MED ORDER — MYCOPHENOLATE MOFETIL 250 MG PO CAPS
250.0000 mg | ORAL_CAPSULE | Freq: Every day | ORAL | Status: DC
  Administered 2023-02-12: 250 mg via ORAL
  Filled 2023-02-12: qty 1

## 2023-02-12 MED ORDER — ENOXAPARIN SODIUM 40 MG/0.4ML IJ SOSY
40.0000 mg | PREFILLED_SYRINGE | INTRAMUSCULAR | Status: DC
  Administered 2023-02-12: 40 mg via SUBCUTANEOUS
  Filled 2023-02-12: qty 0.4

## 2023-02-12 MED ORDER — POTASSIUM CHLORIDE CRYS ER 20 MEQ PO TBCR
40.0000 meq | EXTENDED_RELEASE_TABLET | Freq: Once | ORAL | Status: AC
  Administered 2023-02-12: 40 meq via ORAL
  Filled 2023-02-12: qty 2

## 2023-02-12 MED ORDER — POTASSIUM CHLORIDE IN NACL 20-0.9 MEQ/L-% IV SOLN
INTRAVENOUS | Status: DC
  Filled 2023-02-12: qty 1000

## 2023-02-12 MED ORDER — SIMVASTATIN 40 MG PO TABS: 20.0000 mg | ORAL_TABLET | Freq: Every day | ORAL | Status: DC

## 2023-02-12 MED ORDER — NIRMATRELVIR/RITONAVIR (PAXLOVID)TABLET
3.0000 | ORAL_TABLET | Freq: Two times a day (BID) | ORAL | Status: DC
  Administered 2023-02-12: 3 via ORAL
  Filled 2023-02-12: qty 30

## 2023-02-12 MED ORDER — NIRMATRELVIR/RITONAVIR (PAXLOVID)TABLET: 3.0000 | ORAL_TABLET | Freq: Two times a day (BID) | ORAL | 0 refills | Status: AC

## 2023-02-12 MED ORDER — LOSARTAN POTASSIUM 50 MG PO TABS
25.0000 mg | ORAL_TABLET | Freq: Every day | ORAL | Status: DC
  Filled 2023-02-12: qty 1

## 2023-02-12 MED ORDER — MYCOPHENOLATE MOFETIL 250 MG PO CAPS
500.0000 mg | ORAL_CAPSULE | Freq: Every day | ORAL | Status: DC
  Administered 2023-02-12: 500 mg via ORAL
  Filled 2023-02-12: qty 2

## 2023-02-12 MED ORDER — THIAMINE MONONITRATE 100 MG PO TABS
100.0000 mg | ORAL_TABLET | Freq: Every day | ORAL | Status: DC
  Administered 2023-02-12: 100 mg via ORAL
  Filled 2023-02-12: qty 1

## 2023-02-12 NOTE — Discharge Summary (Signed)
Physician Discharge Summary  Kyle Bowen [id number] DOB: Sep 14, 1946 DOA: 02/11/2023  PCP: Emmaline Kluver, MD  Admit date: 02/11/2023 Discharge date: 02/12/2023  Admitted From: Home Discharge disposition: Home  Recommendations at discharge:  Complete the course of Paxlovid at home.  Antitussives as needed. Simvastatin to keep and hold while on Paxlovid. Stay hydrated Stop meloxicam and other NSAIDs.  Brief narrative: Kyle Bowen is a 77 y.o. male with PMH significant for prostate cancer, complete heart block s/p PPM and myasthenia gravis. In the afternoon of 3/12, patient was was a visitor in infusion clinic with his wife who was getting some treatment. Patient states he he walked outside and then came back down and sat down.  He dozed off and went unresponsive, looked pale and clammy.  His wife was unable to wake him up on repeated attempts.  When he finally woke up after a brief moment, patient continued to feel cold, clammy and pale.  He had urinated and defecated on himself. EMS noted a low blood pressure of 80s. IV normal saline was started and patient was brought to the ED. Of note, for the past few days, patient had noted mild lightheadedness and fatigue.  He has been eating and drinking appropriately and has not had any vomiting or diarrhea.  In the ED, patient was alert, oriented, blood pressure had improved to 130s.  Initially he was not febrile but within 4 hours of presentation, he mounted a fever of 102.2.  Blood pressure did not drop. Initial labs with unremarkable CBC, normal troponin, BN/creatinine elevated to 21/1.45 Respiratory virus panel with COVID PCR positive Urinalysis unremarkable CT head unremarkable for acute findings Chest x-ray unremarkable EKG with AV paced rhythm, QTc prolonged at 502 Pacemaker was interrogated in the ED. Per previous documentation, it showed 38 seconds events with a few PACs that 7:06 PM while he was in the ED and had  a fever. No other events at the time of near-syncope earlier in the day. Admitted to [facility].  Subjective: Patient was seen and examined this morning. Lying on bed.  Not in distress.  Feels much better.  No respiratory symptoms. He states he has been up and walking to the bathroom without feeling any dizziness.  Really wants to go home as his wife is waiting.  They live 30 miles away. Overnight, no recurrence of fever since 8 PM last night, blood pressure mostly elevated to 140s Last set of labs this morning with normal white count, BUN/creatinine improved to 15/1.2, potassium low at 3.3  Hospital course: Syncope and collapse Brought to the ED after a syncopal episode. Noted to have fever, dehydration, AKI due to COVID infection Pacemaker interrogation did not show any primary arrhythmia as the cause Patient could have had orthostatic hypotension.  Adequately hydrated with IV fluid.  Able to ambulate without symptoms this morning. Echocardiogram this morning showed an EF 55%, normal LV function, moderate asymmetric LVH, RV systolic size and function normal.  COVID infection Did not have fever or respiratory symptoms on arrival.  But within few hours of presentation, he mounted a fever of 102.2.   COVID PCR positive in the ED Chest x-ray unremarkable On admission, patient was started on a 5-day course of Paxlovid. Not hypoxic and hence did not require steroids. No respiratory symptoms.  Feels ready for discharge.  Will discharge to complete 5 days of Paxlovid at home. Recent Labs  Lab 02/11/23 1316 02/11/23 1811 02/12/23 0055  SARSCOV2NAA  --  POSITIVE*  --  WBC 9.8  --  6.3   Complete heart block s/p PPM Prolonged QTc No significant events in pacemaker interrogation Prolonged QTc could be due to wide paced QRS complex. Continue to monitor electrolytes.  Hypokalemia Potassium low at 3.3 this morning.  Has potassium rider on fluid ordered for the last lab. Recent Labs  Lab  02/11/23 1316 02/12/23 0055  K 4.1 3.3*   AKI Acute metabolic acidosis Baseline creatinine normal.  Presented with creatinine elevated to 1.45.  Improved with IV. It seems patient was on Mobic at home.  Recommend to hold. Serum bicarb level is however down to 17 today. probably due to overhydration.  No further intervention required. Recent Labs    02/11/23 1316 02/12/23 0055  BUN 21 15  CREATININE 1.45* 1.20  CO2 20* 17*   Essential hypertension PTA on losartan 25 mg daily.  Continue same.  Hyperlipidemia Simvastatin to keep and hold while on Paxlovid.  Myasthenia gravis On CellCept 3 times daily and Mestinon 60 mg daily PRN  Continue the same.   Goals of care   Code Status: Full Code   Wounds:  - Incision (Closed) 10/14/14 Shoulder Right (Active)  Date First Assessed/Time First Assessed: 10/14/14 1546   Location: Shoulder  Location Orientation: Right    Assessments 10/14/2014  6:00 PM 10/15/2014  4:45 PM  Dressing Type Gauze (Comment);Pressure dressing --  Dressing Dry;Intact;Clean Changed  Site / Wound Assessment Other (Comment) --  Drainage Amount None --     No associated orders.     Incision (Closed) 08/05/18 Chest Lateral;Left;Upper (Active)  Date First Assessed/Time First Assessed: 08/05/18 1900   Location: Chest  Location Orientation: Lateral;Left;Upper  Present on Admission: No    Assessments 08/05/2018  8:00 PM 08/06/2018  8:00 AM  Dressing Type None None  Site / Wound Assessment Clean;Dry Clean;Dry  Margins Attached edges (approximated) Attached edges (approximated)  Closure Skin glue Skin glue  Drainage Amount None None  Treatment Other (Comment) --     No associated orders.     Incision (Closed) 09/03/19 Shoulder Left (Active)  Date First Assessed/Time First Assessed: 09/03/19 1010   Location: Shoulder  Location Orientation: Left    Assessments 09/03/2019 12:39 PM 09/04/2019  9:10 AM  Dressing Type Gauze (Comment) Abdominal pads;Gauze  (Comment);Tape dressing  Dressing Clean;Dry;Intact Old drainage (marked)  Site / Wound Assessment Dressing in place / Unable to assess Dressing in place / Unable to assess  Drainage Amount None Moderate  Drainage Description -- Sanguineous  Treatment -- Ice applied     No associated orders.    Discharge Exam:   Vitals:   02/12/23 0350 02/12/23 0430 02/12/23 0500 02/12/23 0832  BP: (!) 161/76 (!) 164/96 (!) 157/94 (!) 139/112  Pulse: 92 79 71 75  Resp: 18   18  Temp: 98.9 F (37.2 C)   98.7 F (37.1 C)  TempSrc: Oral   Oral  SpO2: 95% 97% 96% 98%    There is no height or weight on file to calculate BMI.  General exam: Pleasant, elderly Caucasian male.  Not in distress Skin: No rashes, lesions or ulcers. HEENT: Atraumatic, normocephalic, no obvious bleeding Lungs: Clear to auscultation bilaterally CVS: Regular rate and rhythm, no murmur GI/Abd soft, nontender, nondistended, bowel sound present CNS: Alert, awake, oriented x 3 Psychiatry: Mood appropriate Extremities: No pedal edema, no calf tenderness  Follow ups:    Follow-up Information     Street, Sharon Mt, MD Follow up.   Specialty: Family Medicine Contact  information: [place] [address] [phone]                 Discharge Instructions:   Discharge Instructions     Call MD for:  difficulty breathing, headache or visual disturbances   Complete by: As directed    Call MD for:  extreme fatigue   Complete by: As directed    Call MD for:  hives   Complete by: As directed    Call MD for:  persistant dizziness or light-headedness   Complete by: As directed    Call MD for:  persistant nausea and vomiting   Complete by: As directed    Call MD for:  severe uncontrolled pain   Complete by: As directed    Call MD for:  temperature >100.4   Complete by: As directed    Diet - low sodium heart healthy   Complete by: As directed    Discharge instructions   Complete by: As  directed    Recommendations at discharge:   Complete the course of Paxlovid at home.  Antitussives as needed.  Simvastatin to keep and hold while on Paxlovid.  Stay hydrated  Stop meloxicam and other NSAIDs.  General discharge instructions: Follow with Primary MD Street, Sharon Mt, MD in 7 days  Please request your PCP  to go over your hospital tests, procedures, radiology results at the follow up. Please get your medicines reviewed and adjusted.  Your PCP may decide to repeat certain labs or tests as needed. Do not drive, operate heavy machinery, perform activities at heights, swimming or participation in water activities or provide baby sitting services if your were admitted for syncope or siezures until you have seen by Primary MD or a Neurologist and advised to do so again. [place] Controlled Substance Reporting System database was reviewed. Do not drive, operate heavy machinery, perform activities at heights, swim, participate in water activities or provide baby-sitting services while on medications for pain, sleep and mood until your outpatient physician has reevaluated you and advised to do so again.  You are strongly recommended to comply with the dose, frequency and duration of prescribed medications. Activity: As tolerated with Full fall precautions use walker/cane & assistance as needed Avoid using any recreational substances like cigarette, tobacco, alcohol, or non-prescribed drug. If you experience worsening of your admission symptoms, develop shortness of breath, life threatening emergency, suicidal or homicidal thoughts you must seek medical attention immediately by calling 911 or calling your MD immediately  if symptoms less severe. You must read complete instructions/literature along with all the possible adverse reactions/side effects for all the medicines you take and that have been prescribed to you. Take any new medicine only after you have completely understood  and accepted all the possible adverse reactions/side effects.  Wear Seat belts while driving. You were cared for by a hospitalist during your hospital stay. If you have any questions about your discharge medications or the care you received while you were in the hospital after you are discharged, you can call the unit and ask to speak with the hospitalist or the covering physician. Once you are discharged, your primary care physician will handle any further medical issues. Please note that NO REFILLS for any discharge medications will be authorized once you are discharged, as it is imperative that you return to your primary care physician (or establish a relationship with a primary care physician if you do not have one).   Increase activity slowly  Complete by: As directed        Discharge Medications:   Allergies as of 02/12/2023       Reactions   Aluminum-containing Compounds Other (See Comments)   Myasthenic patient    Aminoglycosides Other (See Comments)   Myasthenic patient    Azithromycin Other (See Comments)   Myasthenic patient    Botulinum Toxins Other (See Comments)   Myasthenic patient    Calcium Channel Blockers Other (See Comments)   Myasthenic patient    Ciprofloxacin Other (See Comments)   Myasthenic patient    Colistin Other (See Comments)   Myasthenic patient    D Tubocurarine [tubocurarine] Other (See Comments)   Myasthenic patient    Erythromycin Base Other (See Comments)   Myasthenic patient    Gentamicin Other (See Comments)   Myasthenic patient    Iodinated Contrast Media Other (See Comments)   Myasthenic patient    Kanamycin Other (See Comments)   Myasthenic patient    Macrolides And Ketolides Other (See Comments)   Myasthenic patient    Magnesium-containing Compounds Other (See Comments)   Myasthenic patient    Moxifloxacin Other (See Comments)   Myasthenic patient    Neomycin Other (See Comments)   Myasthenic patient    Norfloxacin Other (See  Comments)   Myasthenic patient    Ofloxacin Other (See Comments)   Myasthenic patient    Pefloxacin Other (See Comments)   Myasthenic patient    Penicillamine Other (See Comments)   Myasthenic patient    Procainamide Other (See Comments)   Myasthenic patient    Propranolol Other (See Comments)   Myasthenic patient    Quinidine Other (See Comments)   Myasthenic patient    Quinine Derivatives Other (See Comments)   Myasthenic patient    Simethicone Other (See Comments)   Myasthenic patient    Streptomycin Other (See Comments)   Myasthenic patient    Succinylcholine Other (See Comments)   Myasthenic patient    Telithromycin Other (See Comments)   Myasthenic patient    Timolol Other (See Comments)   Myasthenic patient    Tobramycin Other (See Comments)   Myasthenic patient    Vecuronium Other (See Comments)   Myasthenic patient         Medication List     STOP taking these medications    meloxicam 15 MG tablet Commonly known as: MOBIC       TAKE these medications    losartan 25 MG tablet Commonly known as: COZAAR Take 25 mg by mouth daily.   mycophenolate 500 MG tablet Commonly known as: CELLCEPT Take 1,500 mg by mouth See admin instructions. 2000 mg in the morning 1000 mg at bedtime   nirmatrelvir/ritonavir 20 x 150 MG & 10 x '100MG'$  Tabs Commonly known as: PAXLOVID Take 3 tablets by mouth 2 (two) times daily for 5 days. Take nirmatrelvir (150 mg) two tablets twice daily for 5 days and ritonavir (100 mg) one tablet twice daily to complete the course that has been started in the hospital.   pantoprazole 40 MG tablet Commonly known as: PROTONIX Take 40 mg by mouth 2 (two) times daily.   pyridostigmine 60 MG tablet Commonly known as: MESTINON Take 60 mg by mouth daily as needed (difficulty with tongue).   simvastatin 40 MG tablet Commonly known as: ZOCOR Take 0.5 tablets (20 mg total) by mouth daily. Start taking on: February 18, 2023 What changed: These  instructions start on February 18, 2023. If you are unsure what  to do until then, ask your doctor or other care provider.         The results of significant diagnostics from this hospitalization (including imaging, microbiology, ancillary and laboratory) are listed below for reference.    Procedures and Diagnostic Studies:   ECHOCARDIOGRAM COMPLETE  Result Date: 02/12/2023    ECHOCARDIOGRAM REPORT   Patient Name:   Kyle Bowen Date of Exam: 02/12/2023 Medical Rec #:  [id number]        Height:       67.0 in Accession #:    [id number]       Weight:       182.4 lb Date of Birth:  1946/03/07        BSA:          1.945 m Patient Age:    7 years         BP:           157/94 mmHg Patient Gender: M                HR:           76 bpm. Exam Location:  Inpatient Procedure: 2D Echo, Cardiac Doppler and Color Doppler Indications:    Syncope  History:        Patient has no prior history of Echocardiogram examinations.                 Pacemaker, Arrythmias:Tachycardia, Signs/Symptoms:Syncope; Risk                 Factors:Dyslipidemia. COVID +.  Sonographer:    Wenda Low Referring Phys: [phone] DEBBY CROSLEY  Sonographer Comments: Image acquisition challenging due to respiratory motion. IMPRESSIONS  1. Left ventricular ejection fraction, by estimation, is 55%. The left ventricle has normal function. Left ventricular endocardial border not optimally defined to evaluate regional wall motion, but appears grossly preserved. There is moderate asymmetric  left ventricular hypertrophy of the septal segment. Left ventricular diastolic parameters are indeterminate.  2. Right ventricular systolic function is normal. The right ventricular size is normal. There is normal pulmonary artery systolic pressure. The estimated right ventricular systolic pressure is 0000000 mmHg.  3. The mitral valve is grossly normal. Mild mitral valve regurgitation. No evidence of mitral stenosis.  4. The aortic valve is tricuspid. There is mild  calcification of the aortic valve. Aortic valve regurgitation is trivial. Aortic valve sclerosis is present, with no evidence of aortic valve stenosis.  5. The inferior vena cava is normal in size with greater than 50% respiratory variability, suggesting right atrial pressure of 3 mmHg. FINDINGS  Left Ventricle: Left ventricular ejection fraction, by estimation, is 55%. The left ventricle has normal function. Left ventricular endocardial border not optimally defined to evaluate regional wall motion. The left ventricular internal cavity size was normal in size. There is moderate asymmetric left ventricular hypertrophy of the septal segment. Left ventricular diastolic parameters are indeterminate. Right Ventricle: The right ventricular size is normal. No increase in right ventricular wall thickness. Right ventricular systolic function is normal. There is normal pulmonary artery systolic pressure. The tricuspid regurgitant velocity is 2.39 m/s, and  with an assumed right atrial pressure of 3 mmHg, the estimated right ventricular systolic pressure is 0000000 mmHg. Left Atrium: Left atrial size was normal in size. Right Atrium: Right atrial size was normal in size. Pericardium: There is no evidence of pericardial effusion. Mitral Valve: The mitral valve is grossly normal. Mild mitral valve regurgitation. No evidence of mitral valve  stenosis. MV peak gradient, 9.9 mmHg. The mean mitral valve gradient is 2.0 mmHg. Tricuspid Valve: The tricuspid valve is normal in structure. Tricuspid valve regurgitation is mild . No evidence of tricuspid stenosis. Aortic Valve: The aortic valve is tricuspid. There is mild calcification of the aortic valve. Aortic valve regurgitation is trivial. Aortic valve sclerosis is present, with no evidence of aortic valve stenosis. Aortic valve mean gradient measures 3.0 mmHg. Aortic valve peak gradient measures 5.5 mmHg. Aortic valve area, by VTI measures 2.51 cm. Pulmonic Valve: The pulmonic valve  was normal in structure. Pulmonic valve regurgitation is mild. No evidence of pulmonic stenosis. Aorta: The aortic root is normal in size and structure. Venous: The inferior vena cava is normal in size with greater than 50% respiratory variability, suggesting right atrial pressure of 3 mmHg. IAS/Shunts: No atrial level shunt detected by color flow Doppler. Additional Comments: A device lead is visualized in the right atrium and right ventricle.  LEFT VENTRICLE PLAX 2D LVIDd:         4.20 cm LVIDs:         3.00 cm LV PW:         1.20 cm LV IVS:        1.36 cm LVOT diam:     2.20 cm LV SV:         66 LV SV Index:   34 LVOT Area:     3.80 cm  RIGHT VENTRICLE RV Basal diam:  3.75 cm RV Mid diam:    2.70 cm RV S prime:     14.90 cm/s TAPSE (M-mode): 3.2 cm LEFT ATRIUM             Index        RIGHT ATRIUM           Index LA diam:        3.90 cm 2.01 cm/m   RA Area:     20.40 cm LA Vol (A2C):   64.0 ml 32.91 ml/m  RA Volume:   52.80 ml  27.15 ml/m LA Vol (A4C):   57.3 ml 29.47 ml/m LA Biplane Vol: 64.3 ml 33.07 ml/m  AORTIC VALVE                    PULMONIC VALVE AV Area (Vmax):    2.94 cm     PV Vmax:       1.14 m/s AV Area (Vmean):   2.62 cm     PV Peak grad:  5.2 mmHg AV Area (VTI):     2.51 cm AV Vmax:           117.00 cm/s AV Vmean:          83.000 cm/s AV VTI:            0.262 m AV Peak Grad:      5.5 mmHg AV Mean Grad:      3.0 mmHg LVOT Vmax:         90.40 cm/s LVOT Vmean:        57.300 cm/s LVOT VTI:          0.173 m LVOT/AV VTI ratio: 0.66  AORTA Ao Root diam: 3.40 cm MITRAL VALVE                TRICUSPID VALVE MV Area (PHT): 6.17 cm     TR Peak grad:   22.8 mmHg MV Area VTI:   1.96 cm     TR Vmax:  239.00 cm/s MV Peak grad:  9.9 mmHg MV Mean grad:  2.0 mmHg     SHUNTS MV Vmax:       1.57 m/s     Systemic VTI:  0.17 m MV Vmean:      65.4 cm/s    Systemic Diam: 2.20 cm MV Decel Time: 123 msec MV E velocity: 135.00 cm/s Cherlynn Kaiser MD Electronically signed by Cherlynn Kaiser MD Signature  Date/Time: 02/12/2023/10:32:27 AM    Final    DG Chest Port 1 View  Result Date: 02/11/2023 CLINICAL DATA:  Table formatting from the original note was not included. Images from the original note were not included. Reason for exam: Syncope. Pt. States feeling clammy and about to pass out earlier today. Hx pacemaker surgically placed 3-.*comment was truncated*106001 Syncope 106001 EXAM: PORTABLE CHEST 1 VIEW COMPARISON:  None Available. FINDINGS: Normal mediastinum and cardiac silhouette. Normal pulmonary vasculature. No evidence of effusion, infiltrate, or pneumothorax. No acute bony abnormality. LEFT-sided pacemaker and  bilateral shoulder prosthetics noted. IMPRESSION: No acute cardiopulmonary process. Electronically Signed   By: Suzy Bouchard M.D.   On: 02/11/2023 16:11   CT Head Wo Contrast  Result Date: 02/11/2023 CLINICAL DATA:  Syncope EXAM: CT HEAD WITHOUT CONTRAST TECHNIQUE: Contiguous axial images were obtained from the base of the skull through the vertex without intravenous contrast. RADIATION DOSE REDUCTION: This exam was performed according to the departmental dose-optimization program which includes automated exposure control, adjustment of the mA and/or kV according to patient size and/or use of iterative reconstruction technique. COMPARISON:  CT HEad 12/27/15 FINDINGS: Brain: No evidence of acute infarction, hemorrhage, hydrocephalus, extra-axial collection or mass lesion/mass effect. There is sequela moderate chronic microvascular ischemic change. Vascular: No disproportionately hyperdense vessel or unexpected calcification. Skull: Normal. Negative for fracture or focal lesion. Sinuses/Orbits: No middle ear or mastoid effusion. Paranasal sinuses are clear. Bilateral lens replacement. Orbits are otherwise unremarkable. Other: None. IMPRESSION: No hemorrhage or CT evidence of an acute infarct. Electronically Signed   By: Marin Roberts M.D.   On: 02/11/2023 15:53     Labs:   Basic  Metabolic Panel: Recent Labs  Lab 02/11/23 1316 02/12/23 0055  NA 136 135  K 4.1 3.3*  CL 105 111  CO2 20* 17*  GLUCOSE 124* 137*  BUN 21 15  CREATININE 1.45* 1.20  CALCIUM 8.9 7.8*   GFR CrCl cannot be calculated (Unknown ideal weight.). Liver Function Tests: No results for input(s): "AST", "ALT", "ALKPHOS", "BILITOT", "PROT", "ALBUMIN" in the last 168 hours. No results for input(s): "LIPASE", "AMYLASE" in the last 168 hours. No results for input(s): "AMMONIA" in the last 168 hours. Coagulation profile No results for input(s): "INR", "PROTIME" in the last 168 hours.  CBC: Recent Labs  Lab 02/11/23 1316 02/12/23 0055  WBC 9.8 6.3  NEUTROABS  --  4.3  HGB 13.7 12.1*  HCT 41.4 35.3*  MCV 93.5 91.7  PLT 168 131*   Cardiac Enzymes: Recent Labs  Lab 02/12/23 0055  CKTOTAL 125   BNP: Invalid input(s): "POCBNP" CBG: Recent Labs  Lab 02/11/23 1346  GLUCAP 121*   D-Dimer No results for input(s): "DDIMER" in the last 72 hours. Hgb A1c No results for input(s): "HGBA1C" in the last 72 hours. Lipid Profile No results for input(s): "CHOL", "HDL", "LDLCALC", "TRIG", "CHOLHDL", "LDLDIRECT" in the last 72 hours. Thyroid function studies Recent Labs    02/12/23 0055  TSH 0.636   Anemia work up No results for input(s): "VITAMINB12", "FOLATE", "FERRITIN", "TIBC", "IRON", "  RETICCTPCT" in the last 72 hours. Microbiology Recent Results (from the past 240 hour(s))  Resp panel by RT-PCR (RSV, Flu A&B, Covid) Anterior Nasal Swab     Status: Abnormal   Collection Time: 02/11/23  6:11 PM   Specimen: Anterior Nasal Swab  Result Value Ref Range Status   SARS Coronavirus 2 by RT PCR POSITIVE (A) NEGATIVE Final   Influenza A by PCR NEGATIVE NEGATIVE Final   Influenza B by PCR NEGATIVE NEGATIVE Final    Comment: (NOTE) The Xpert Xpress SARS-CoV-2/FLU/RSV plus assay is intended as an aid in the diagnosis of influenza from Nasopharyngeal swab specimens and should not be used  as a sole basis for treatment. Nasal washings and aspirates are unacceptable for Xpert Xpress SARS-CoV-2/FLU/RSV testing.  Fact Sheet for Patients: EntrepreneurPulse.com.au  Fact Sheet for Healthcare Providers: IncredibleEmployment.be  This test is not yet approved or cleared by the [place] FDA and has been authorized for detection and/or diagnosis of SARS-CoV-2 by FDA under an Emergency Use Authorization (EUA). This EUA will remain in effect (meaning this test can be used) for the duration of the COVID-19 declaration under Section 564(b)(1) of the Act, 21 U.S.C. section 360bbb-3(b)(1), unless the authorization is terminated or revoked.     Resp Syncytial Virus by PCR NEGATIVE NEGATIVE Final    Comment: (NOTE) Fact Sheet for Patients: EntrepreneurPulse.com.au  Fact Sheet for Healthcare Providers: IncredibleEmployment.be  This test is not yet approved or cleared by the [place] FDA and has been authorized for detection and/or diagnosis of SARS-CoV-2 by FDA under an Emergency Use Authorization (EUA). This EUA will remain in effect (meaning this test can be used) for the duration of the COVID-19 declaration under Section 564(b)(1) of the Act, 21 U.S.C. section 360bbb-3(b)(1), unless the authorization is terminated or revoked.  Performed at [facility], [place] [address] [place], [place] [address]     Time coordinating discharge: 35 minutes  Signed: Lashanda Storlie  [facility] 02/12/2023, 12:48 PM

## 2023-02-12 NOTE — Progress Notes (Signed)
PT Cancellation Note  Patient Details Name: TUF LATONA MRN: [id number] DOB: May 18, 1946   Cancelled Treatment:    Reason Eval/Treat Not Completed: Other (comment);PT screened, no needs identified, will sign off.  Pt feels he is walking as normal, walked to BR and had no dizziness/instability.  Will sign off and encouraged pt to let nursing know if this changes, and PT will return.   Ramond Dial 02/12/2023, 12:42 PM  Mee Hives, PT PhD Acute Rehab Dept. Number: [place]

## 2023-02-12 NOTE — Progress Notes (Signed)
*  PRELIMINARY RESULTS* Echocardiogram 2D Echocardiogram has been performed.  Kyle Bowen 02/12/2023, 9:38 AM

## 2023-02-12 NOTE — ED Notes (Signed)
Patient awake and alert, no s/s of distress, eating breakfast, able to verbalize his needs, will continue to monitor.

## 2023-02-12 NOTE — H&P (Addendum)
PCP:   Street, Sharon Mt, MD   Chief Complaint:  Syncope and collapse  HPI: This is a pleasant 77 year old male with past medical history of prostate cancer, complete heart block SP PPM, and myasthenia gravis.  This afternoon he went to the infusion center to see his wife to get some treatment.  While waiting, he waited to breakfast came back 8 satting relaxed.  Not long after he felt clammy, dizzy and lightheaded.  He states he must of passed out because.  He denies chest pains or pain. He he was incontinent of bowel and bladder.  He does not recall this occurring.  When he came, he was still sitting in a chair, he had not fallen out.  Nursing at the facility noted, he was sent to the ER.  In the ER, workup relatively unrevealing.  UA, chest x-ray, WBCs all within normal range.  The patient is positive for COVID but does not require oxygen.  Review of Systems:  The patient denies anorexia, fever, weight loss,, vision loss, decreased hearing, hoarseness, chest pain, syncope, dyspnea on exertion, peripheral edema, balance deficits, hemoptysis, abdominal pain, melena, hematochezia, severe indigestion/heartburn, hematuria, incontinence, genital sores, muscle weakness, suspicious skin lesions, transient blindness, difficulty walking, depression, unusual weight change, abnormal bleeding, enlarged lymph nodes, angioedema, and breast masses. Positives: Syncope and collapse  Past Medical History: Past Medical History:  Diagnosis Date   Arthritis    Cancer ([place]) 91   prostate    Complete heart block ([place]) 99991111   Complication of anesthesia    occ bp drops after surgery whrn getting up    GERD (gastroesophageal reflux disease)    occ   Heart block    History of myasthenia gravis    History of prostate cancer 08/01/2018   Myasthenia ([place])    Myasthenia gravis without (acute) exacerbation ([place]) 03/06/2016   Presence of permanent cardiac pacemaker    S/P shoulder replacement 10/14/2014    Past Surgical History:  Procedure Laterality Date   CATARACT EXTRACTION     HERNIA REPAIR Left 05   inguinal   JOINT REPLACEMENT Left 2016   Surgery was in [place] N/A 08/05/2018   Procedure: PACEMAKER IMPLANT - Dual Chamber;  Surgeon: Deboraha Sprang, MD;  Location: [facility];  Service: Cardiovascular;  Laterality: N/A;   PENILE PROSTHESIS IMPLANT  93   00.08 replaced   PROSTATE SURGERY  91   ca   TONSILLECTOMY     TOTAL SHOULDER ARTHROPLASTY Right 10/14/2014   Procedure: RIGHT TOTAL SHOULDER ARTHROPLASTY;  Surgeon: Augustin Schooling, MD;  Location: [facility];  Service: Orthopedics;  Laterality: Right;   TOTAL SHOULDER ARTHROPLASTY Left 09/03/2019   Procedure: Anatomic TOTAL SHOULDER ARTHROPLASTY;  Surgeon: Netta Cedars, MD;  Location: [facility];  Service: Orthopedics;  Laterality: Left;  with interscalene block    Medications: Prior to Admission medications   Medication Sig Start Date End Date Taking? Authorizing Provider  losartan (COZAAR) 25 MG tablet Take 25 mg by mouth daily.    [provider]  meloxicam (MOBIC) 15 MG tablet Take 15 mg by mouth daily.    [provider]  mycophenolate (CELLCEPT) 500 MG tablet Take 1,500 mg by mouth See admin instructions. 2000 mg in the morning 1000 mg at bedtime 07/22/18   [provider]  pantoprazole (PROTONIX) 40 MG tablet Take 40 mg by mouth 2 (two) times daily. 08/23/20   [provider]  pyridostigmine (MESTINON) 60 MG tablet  Take 60 mg by mouth daily as needed (difficulty with tongue).  07/08/18   [provider]  simvastatin (ZOCOR) 40 MG tablet Take 20 mg by mouth daily. 12/27/20   [provider]    Allergies:   Allergies  Allergen Reactions   Aluminum-Containing Compounds Other (See Comments)    Myasthenic patient    Aminoglycosides Other (See Comments)    Myasthenic patient    Azithromycin Other (See Comments)    Myasthenic patient    Botulinum Toxins  Other (See Comments)    Myasthenic patient    Calcium Channel Blockers Other (See Comments)    Myasthenic patient    Ciprofloxacin Other (See Comments)    Myasthenic patient    Colistin Other (See Comments)    Myasthenic patient    D Tubocurarine [Tubocurarine] Other (See Comments)    Myasthenic patient    Erythromycin Base Other (See Comments)    Myasthenic patient    Gentamicin Other (See Comments)    Myasthenic patient    Iodinated Contrast Media Other (See Comments)    Myasthenic patient    Kanamycin Other (See Comments)    Myasthenic patient    Macrolides And Ketolides Other (See Comments)    Myasthenic patient    Magnesium-Containing Compounds Other (See Comments)    Myasthenic patient    Moxifloxacin Other (See Comments)    Myasthenic patient    Neomycin Other (See Comments)    Myasthenic patient    Norfloxacin Other (See Comments)    Myasthenic patient    Ofloxacin Other (See Comments)    Myasthenic patient    Pefloxacin Other (See Comments)    Myasthenic patient    Penicillamine Other (See Comments)    Myasthenic patient    Procainamide Other (See Comments)    Myasthenic patient    Propranolol Other (See Comments)    Myasthenic patient    Quinidine Other (See Comments)    Myasthenic patient    Quinine Derivatives Other (See Comments)    Myasthenic patient    Simethicone Other (See Comments)    Myasthenic patient    Streptomycin Other (See Comments)    Myasthenic patient    Succinylcholine Other (See Comments)    Myasthenic patient    Telithromycin Other (See Comments)    Myasthenic patient    Timolol Other (See Comments)    Myasthenic patient    Tobramycin Other (See Comments)    Myasthenic patient    Vecuronium Other (See Comments)    Myasthenic patient     Social History:  reports that he has never smoked. He has never been exposed to tobacco smoke. He has never used smokeless tobacco. He reports current alcohol use. He reports that he does not  use drugs.  Family History: Family History  Problem Relation Age of Onset   Aneurysm Mother    Alzheimer's disease Father    Multiple sclerosis Brother     Physical Exam: Vitals:   02/11/23 2200 02/11/23 2230 02/11/23 2300 02/11/23 2330  BP: (!) 145/113 (!) 147/129 (!) 166/101 (!) 168/85  Pulse: 84 84 78 77  Resp: '18 18 17 13  '$ Temp:      TempSrc:      SpO2: 97% 97% 96% 96%    General:  Alert and oriented times three, well developed and nourished, no acute distress Eyes: PERRLA, pink conjunctiva, no scleral icterus ENT: Moist oral mucosa, neck supple, no thyromegaly Lungs: clear to ascultation, no wheeze, no crackles, no use of accessory  muscles Cardiovascular: regular rate and rhythm, no regurgitation, no gallops, no murmurs. No carotid bruits, no JVD Abdomen: soft, positive BS, non-tender, non-distended, no organomegaly, not an acute abdomen GU: not examined Neuro: CN II - XII grossly intact, sensation intact Musculoskeletal: strength 5/5 all extremities, no clubbing, cyanosis or edema Skin: no rash, no subcutaneous crepitation, no decubitus Psych: appropriate patient   Labs on Admission:  Recent Labs    02/11/23 1316  NA 136  K 4.1  CL 105  CO2 20*  GLUCOSE 124*  BUN 21  CREATININE 1.45*  CALCIUM 8.9   Recent Labs    02/11/23 1316  WBC 9.8  HGB 13.7  HCT 41.4  MCV 93.5  PLT 168    Micro Results: Recent Results (from the past 240 hour(s))  Resp panel by RT-PCR (RSV, Flu A&B, Covid) Anterior Nasal Swab     Status: Abnormal   Collection Time: 02/11/23  6:11 PM   Specimen: Anterior Nasal Swab  Result Value Ref Range Status   SARS Coronavirus 2 by RT PCR POSITIVE (A) NEGATIVE Final   Influenza A by PCR NEGATIVE NEGATIVE Final   Influenza B by PCR NEGATIVE NEGATIVE Final    Comment: (NOTE) The Xpert Xpress SARS-CoV-2/FLU/RSV plus assay is intended as an aid in the diagnosis of influenza from Nasopharyngeal swab specimens and should not be used as a  sole basis for treatment. Nasal washings and aspirates are unacceptable for Xpert Xpress SARS-CoV-2/FLU/RSV testing.  Fact Sheet for Patients: EntrepreneurPulse.com.au  Fact Sheet for Healthcare Providers: IncredibleEmployment.be  This test is not yet approved or cleared by the [place] FDA and has been authorized for detection and/or diagnosis of SARS-CoV-2 by FDA under an Emergency Use Authorization (EUA). This EUA will remain in effect (meaning this test can be used) for the duration of the COVID-19 declaration under Section 564(b)(1) of the Act, 21 U.S.C. section 360bbb-3(b)(1), unless the authorization is terminated or revoked.     Resp Syncytial Virus by PCR NEGATIVE NEGATIVE Final    Comment: (NOTE) Fact Sheet for Patients: EntrepreneurPulse.com.au  Fact Sheet for Healthcare Providers: IncredibleEmployment.be  This test is not yet approved or cleared by the [place] FDA and has been authorized for detection and/or diagnosis of SARS-CoV-2 by FDA under an Emergency Use Authorization (EUA). This EUA will remain in effect (meaning this test can be used) for the duration of the COVID-19 declaration under Section 564(b)(1) of the Act, 21 U.S.C. section 360bbb-3(b)(1), unless the authorization is terminated or revoked.  Performed at [facility], [place] [address] [place], [place] [address]      Radiological Exams on Admission: DG Chest Port 1 View  Result Date: 02/11/2023 CLINICAL DATA:  Table formatting from the original note was not included. Images from the original note were not included. Reason for exam: Syncope. Pt. States feeling clammy and about to pass out earlier today. Hx pacemaker surgically placed 3-.*comment was truncated*106001 Syncope 106001 EXAM: PORTABLE CHEST 1 VIEW COMPARISON:  None Available. FINDINGS: Normal mediastinum and cardiac silhouette. Normal pulmonary  vasculature. No evidence of effusion, infiltrate, or pneumothorax. No acute bony abnormality. LEFT-sided pacemaker and  bilateral shoulder prosthetics noted. IMPRESSION: No acute cardiopulmonary process. Electronically Signed   By: Suzy Bouchard M.D.   On: 02/11/2023 16:11   CT Head Wo Contrast  Result Date: 02/11/2023 CLINICAL DATA:  Syncope EXAM: CT HEAD WITHOUT CONTRAST TECHNIQUE: Contiguous axial images were obtained from the base of the skull through the vertex without intravenous contrast. RADIATION  DOSE REDUCTION: This exam was performed according to the departmental dose-optimization program which includes automated exposure control, adjustment of the mA and/or kV according to patient size and/or use of iterative reconstruction technique. COMPARISON:  CT HEad 12/27/15 FINDINGS: Brain: No evidence of acute infarction, hemorrhage, hydrocephalus, extra-axial collection or mass lesion/mass effect. There is sequela moderate chronic microvascular ischemic change. Vascular: No disproportionately hyperdense vessel or unexpected calcification. Skull: Normal. Negative for fracture or focal lesion. Sinuses/Orbits: No middle ear or mastoid effusion. Paranasal sinuses are clear. Bilateral lens replacement. Orbits are otherwise unremarkable. Other: None. IMPRESSION: No hemorrhage or CT evidence of an acute infarct. Electronically Signed   By: Marin Roberts M.D.   On: 02/11/2023 15:53    Assessment/Plan Present on Admission:  Syncope and collapse -Unclear etiology, suspect related to COVID infection -Patient's pacemaker has been interrogated in the ER.  EDP spoke with [place] regarding pacemaker interrogation. He did have a 38 seconds events with a few PACs that 7:06 PM. No other events   -Monitor on telemetry, IV fluid hydration -No treatment for COVID initiated -2D echo in a.m. -EKG, AV paced with prolonged QTc 502.  Will optimize magnesium and potassium levels.  Goal magnesium 2, goal potassium  4. -Holter monitor not needed as patient with PPM which can be interrogated.  AKI -Hydrating patient -Repeat BMP in a.m.   COVID infection -Will start treatment with Paxlovid and given patient's immunocompromise status from CellCept.  This increases morbidity risk from COVID infection.  Myasthenia gravis -Patient immunocompromised secondary to medication. -Resume patient's CellCept. -Patient's use Mestinon as needed   HTN -Stable resume patient's losartan.  Currently patient normotensive   Complete heart block ([place]) -CCM.  Has been interrogated.  No significant events   History of prostate cancer  Deberah Adolf 02/12/2023, 12:08 AM

## 2023-02-14 LAB — CUP PACEART REMOTE DEVICE CHECK
Battery Remaining Longevity: 42 mo
Battery Remaining Percentage: 37 %
Battery Voltage: 2.96 V
Brady Statistic AP VP Percent: 6.2 %
Brady Statistic AP VS Percent: 29 %
Brady Statistic AS VP Percent: 17 %
Brady Statistic AS VS Percent: 47 %
Brady Statistic RA Percent Paced: 32 %
Brady Statistic RV Percent Paced: 23 %
Date Time Interrogation Session: 20240312221055
Implantable Lead Connection Status: 753985
Implantable Lead Connection Status: 753985
Implantable Lead Implant Date: 20190904
Implantable Lead Implant Date: 20190904
Implantable Lead Location: 753859
Implantable Lead Location: 753860
Implantable Lead Model: 3830
Implantable Lead Model: 5076
Implantable Pulse Generator Implant Date: 20190904
Lead Channel Impedance Value: 390 Ohm
Lead Channel Impedance Value: 450 Ohm
Lead Channel Pacing Threshold Amplitude: 0.5 V
Lead Channel Pacing Threshold Amplitude: 1.125 V
Lead Channel Pacing Threshold Pulse Width: 0.5 ms
Lead Channel Pacing Threshold Pulse Width: 1 ms
Lead Channel Sensing Intrinsic Amplitude: 2.9 mV
Lead Channel Sensing Intrinsic Amplitude: 5 mV
Lead Channel Setting Pacing Amplitude: 1.375
Lead Channel Setting Pacing Amplitude: 2 V
Lead Channel Setting Pacing Pulse Width: 1 ms
Lead Channel Setting Sensing Sensitivity: 1 mV
Pulse Gen Model: 2272
Pulse Gen Serial Number: 3873687

## 2023-02-19 DIAGNOSIS — Z950 Presence of cardiac pacemaker: Secondary | ICD-10-CM | POA: Diagnosis not present

## 2023-02-19 DIAGNOSIS — I442 Atrioventricular block, complete: Secondary | ICD-10-CM | POA: Diagnosis not present

## 2023-02-19 DIAGNOSIS — G7000 Myasthenia gravis without (acute) exacerbation: Secondary | ICD-10-CM | POA: Diagnosis not present

## 2023-02-19 DIAGNOSIS — U071 COVID-19: Secondary | ICD-10-CM | POA: Diagnosis not present

## 2023-03-20 NOTE — Progress Notes (Signed)
Remote pacemaker transmission.   

## 2023-05-14 ENCOUNTER — Ambulatory Visit (INDEPENDENT_AMBULATORY_CARE_PROVIDER_SITE_OTHER): Payer: Medicare Other

## 2023-05-14 DIAGNOSIS — I442 Atrioventricular block, complete: Secondary | ICD-10-CM

## 2023-05-20 LAB — CUP PACEART REMOTE DEVICE CHECK
Date Time Interrogation Session: 20240618093113
Implantable Lead Connection Status: 753985
Implantable Lead Connection Status: 753985
Implantable Lead Implant Date: 20190904
Implantable Lead Implant Date: 20190904
Implantable Lead Location: 753859
Implantable Lead Location: 753860
Implantable Lead Model: 3830
Implantable Lead Model: 5076
Implantable Pulse Generator Implant Date: 20190904
Pulse Gen Model: 2272
Pulse Gen Serial Number: 3873687

## 2023-06-02 ENCOUNTER — Encounter: Payer: Self-pay | Admitting: Internal Medicine

## 2023-06-10 NOTE — Progress Notes (Signed)
Remote pacemaker transmission.   

## 2023-07-10 DIAGNOSIS — H524 Presbyopia: Secondary | ICD-10-CM | POA: Diagnosis not present

## 2023-07-10 DIAGNOSIS — Z961 Presence of intraocular lens: Secondary | ICD-10-CM | POA: Diagnosis not present

## 2023-07-10 DIAGNOSIS — Z135 Encounter for screening for eye and ear disorders: Secondary | ICD-10-CM | POA: Diagnosis not present

## 2023-07-18 DIAGNOSIS — R42 Dizziness and giddiness: Secondary | ICD-10-CM | POA: Diagnosis not present

## 2023-07-18 DIAGNOSIS — E785 Hyperlipidemia, unspecified: Secondary | ICD-10-CM | POA: Diagnosis not present

## 2023-08-13 ENCOUNTER — Ambulatory Visit (INDEPENDENT_AMBULATORY_CARE_PROVIDER_SITE_OTHER): Payer: Medicare Other

## 2023-08-13 DIAGNOSIS — I442 Atrioventricular block, complete: Secondary | ICD-10-CM

## 2023-08-13 LAB — CUP PACEART REMOTE DEVICE CHECK
Battery Remaining Longevity: 32 mo
Battery Remaining Percentage: 32 %
Battery Voltage: 2.96 V
Brady Statistic AP VP Percent: 16 %
Brady Statistic AP VS Percent: 19 %
Brady Statistic AS VP Percent: 44 %
Brady Statistic AS VS Percent: 20 %
Brady Statistic RA Percent Paced: 34 %
Brady Statistic RV Percent Paced: 60 %
Date Time Interrogation Session: 20240911020013
Implantable Lead Connection Status: 753985
Implantable Lead Connection Status: 753985
Implantable Lead Implant Date: 20190904
Implantable Lead Implant Date: 20190904
Implantable Lead Location: 753859
Implantable Lead Location: 753860
Implantable Lead Model: 3830
Implantable Lead Model: 5076
Implantable Pulse Generator Implant Date: 20190904
Lead Channel Impedance Value: 390 Ohm
Lead Channel Impedance Value: 450 Ohm
Lead Channel Pacing Threshold Amplitude: 0.5 V
Lead Channel Pacing Threshold Amplitude: 1.125 V
Lead Channel Pacing Threshold Pulse Width: 0.5 ms
Lead Channel Pacing Threshold Pulse Width: 1 ms
Lead Channel Sensing Intrinsic Amplitude: 2.8 mV
Lead Channel Sensing Intrinsic Amplitude: 4.1 mV
Lead Channel Setting Pacing Amplitude: 1.375
Lead Channel Setting Pacing Amplitude: 2 V
Lead Channel Setting Pacing Pulse Width: 1 ms
Lead Channel Setting Sensing Sensitivity: 1 mV
Pulse Gen Model: 2272
Pulse Gen Serial Number: 9058775

## 2023-08-29 NOTE — Progress Notes (Signed)
Remote pacemaker transmission.   

## 2023-09-08 DIAGNOSIS — E8881 Metabolic syndrome: Secondary | ICD-10-CM | POA: Diagnosis not present

## 2023-09-08 DIAGNOSIS — E785 Hyperlipidemia, unspecified: Secondary | ICD-10-CM | POA: Diagnosis not present

## 2023-10-02 DIAGNOSIS — C61 Malignant neoplasm of prostate: Secondary | ICD-10-CM | POA: Diagnosis not present

## 2023-11-03 DIAGNOSIS — Z23 Encounter for immunization: Secondary | ICD-10-CM | POA: Diagnosis not present

## 2023-11-03 DIAGNOSIS — Z0000 Encounter for general adult medical examination without abnormal findings: Secondary | ICD-10-CM | POA: Diagnosis not present

## 2023-11-03 DIAGNOSIS — G7000 Myasthenia gravis without (acute) exacerbation: Secondary | ICD-10-CM | POA: Diagnosis not present

## 2023-11-03 DIAGNOSIS — R0989 Other specified symptoms and signs involving the circulatory and respiratory systems: Secondary | ICD-10-CM | POA: Diagnosis not present

## 2023-11-03 DIAGNOSIS — Z79899 Other long term (current) drug therapy: Secondary | ICD-10-CM | POA: Diagnosis not present

## 2023-11-03 DIAGNOSIS — E8881 Metabolic syndrome: Secondary | ICD-10-CM | POA: Diagnosis not present

## 2023-11-03 DIAGNOSIS — E785 Hyperlipidemia, unspecified: Secondary | ICD-10-CM | POA: Diagnosis not present

## 2023-11-12 ENCOUNTER — Ambulatory Visit (INDEPENDENT_AMBULATORY_CARE_PROVIDER_SITE_OTHER): Payer: Medicare Other

## 2023-11-12 DIAGNOSIS — I442 Atrioventricular block, complete: Secondary | ICD-10-CM

## 2023-11-12 LAB — CUP PACEART REMOTE DEVICE CHECK
Battery Remaining Longevity: 32 mo
Battery Remaining Percentage: 29 %
Battery Voltage: 2.95 V
Brady Statistic AP VP Percent: 14 %
Brady Statistic AP VS Percent: 22 %
Brady Statistic AS VP Percent: 36 %
Brady Statistic AS VS Percent: 27 %
Brady Statistic RA Percent Paced: 35 %
Brady Statistic RV Percent Paced: 50 %
Date Time Interrogation Session: 20241211020018
Implantable Lead Connection Status: 753985
Implantable Lead Connection Status: 753985
Implantable Lead Implant Date: 20190904
Implantable Lead Implant Date: 20190904
Implantable Lead Location: 753859
Implantable Lead Location: 753860
Implantable Lead Model: 3830
Implantable Lead Model: 5076
Implantable Pulse Generator Implant Date: 20190904
Lead Channel Impedance Value: 380 Ohm
Lead Channel Impedance Value: 450 Ohm
Lead Channel Pacing Threshold Amplitude: 0.5 V
Lead Channel Pacing Threshold Amplitude: 0.875 V
Lead Channel Pacing Threshold Pulse Width: 0.5 ms
Lead Channel Pacing Threshold Pulse Width: 1 ms
Lead Channel Sensing Intrinsic Amplitude: 1.9 mV
Lead Channel Sensing Intrinsic Amplitude: 4.3 mV
Lead Channel Setting Pacing Amplitude: 1.125
Lead Channel Setting Pacing Amplitude: 2 V
Lead Channel Setting Pacing Pulse Width: 1 ms
Lead Channel Setting Sensing Sensitivity: 1 mV
Pulse Gen Model: 2272
Pulse Gen Serial Number: 9058775

## 2023-11-24 DIAGNOSIS — Z23 Encounter for immunization: Secondary | ICD-10-CM | POA: Diagnosis not present

## 2023-12-04 ENCOUNTER — Encounter: Payer: Self-pay | Admitting: Internal Medicine

## 2023-12-19 NOTE — Progress Notes (Signed)
Remote pacemaker transmission.   

## 2024-01-07 DIAGNOSIS — N3001 Acute cystitis with hematuria: Secondary | ICD-10-CM | POA: Diagnosis not present

## 2024-01-07 DIAGNOSIS — Z6827 Body mass index (BMI) 27.0-27.9, adult: Secondary | ICD-10-CM | POA: Diagnosis not present

## 2024-01-21 ENCOUNTER — Ambulatory Visit: Payer: Medicare Other | Admitting: Internal Medicine

## 2024-02-11 ENCOUNTER — Ambulatory Visit: Payer: Medicare Other

## 2024-02-11 DIAGNOSIS — I442 Atrioventricular block, complete: Secondary | ICD-10-CM

## 2024-02-11 LAB — CUP PACEART REMOTE DEVICE CHECK
Battery Remaining Longevity: 29 mo
Battery Remaining Percentage: 27 %
Battery Voltage: 2.93 V
Brady Statistic AP VP Percent: 13 %
Brady Statistic AP VS Percent: 22 %
Brady Statistic AS VP Percent: 34 %
Brady Statistic AS VS Percent: 30 %
Brady Statistic RA Percent Paced: 34 %
Brady Statistic RV Percent Paced: 47 %
Date Time Interrogation Session: 20250312020012
Implantable Lead Connection Status: 753985
Implantable Lead Connection Status: 753985
Implantable Lead Implant Date: 20190904
Implantable Lead Implant Date: 20190904
Implantable Lead Location: 753859
Implantable Lead Location: 753860
Implantable Lead Model: 3830
Implantable Lead Model: 5076
Implantable Pulse Generator Implant Date: 20190904
Lead Channel Impedance Value: 390 Ohm
Lead Channel Impedance Value: 440 Ohm
Lead Channel Pacing Threshold Amplitude: 0.5 V
Lead Channel Pacing Threshold Amplitude: 0.875 V
Lead Channel Pacing Threshold Pulse Width: 0.5 ms
Lead Channel Pacing Threshold Pulse Width: 1 ms
Lead Channel Sensing Intrinsic Amplitude: 2.2 mV
Lead Channel Sensing Intrinsic Amplitude: 5 mV
Lead Channel Setting Pacing Amplitude: 1.125
Lead Channel Setting Pacing Amplitude: 2 V
Lead Channel Setting Pacing Pulse Width: 1 ms
Lead Channel Setting Sensing Sensitivity: 1 mV
Pulse Gen Model: 2272
Pulse Gen Serial Number: 9058775

## 2024-02-18 ENCOUNTER — Encounter: Payer: Self-pay | Admitting: Internal Medicine

## 2024-02-18 ENCOUNTER — Ambulatory Visit: Payer: Medicare Other | Attending: Internal Medicine | Admitting: Internal Medicine

## 2024-02-18 VITALS — BP 146/82 | HR 63 | Ht 67.0 in | Wt 175.4 lb

## 2024-02-18 DIAGNOSIS — R0600 Dyspnea, unspecified: Secondary | ICD-10-CM | POA: Insufficient documentation

## 2024-02-18 DIAGNOSIS — I4719 Other supraventricular tachycardia: Secondary | ICD-10-CM | POA: Diagnosis not present

## 2024-02-18 DIAGNOSIS — I442 Atrioventricular block, complete: Secondary | ICD-10-CM | POA: Insufficient documentation

## 2024-02-18 DIAGNOSIS — Z79899 Other long term (current) drug therapy: Secondary | ICD-10-CM | POA: Diagnosis not present

## 2024-02-18 DIAGNOSIS — Z950 Presence of cardiac pacemaker: Secondary | ICD-10-CM | POA: Diagnosis not present

## 2024-02-18 LAB — BASIC METABOLIC PANEL
BUN/Creatinine Ratio: 19 (ref 10–24)
BUN: 18 mg/dL (ref 8–27)
CO2: 22 mmol/L (ref 20–29)
Calcium: 9.7 mg/dL (ref 8.6–10.2)
Chloride: 104 mmol/L (ref 96–106)
Creatinine, Ser: 0.97 mg/dL (ref 0.76–1.27)
Glucose: 91 mg/dL (ref 70–99)
Potassium: 4.6 mmol/L (ref 3.5–5.2)
Sodium: 140 mmol/L (ref 134–144)
eGFR: 80 mL/min/{1.73_m2} (ref >59)

## 2024-02-18 NOTE — Patient Instructions (Signed)
 Medication Instructions:  Your physician recommends that you continue on your current medications as directed. Please refer to the Current Medication list given to you today.  *If you need a refill on your cardiac medications before your next appointment, please call your pharmacy*   Lab Work: BMET and BNP today  If you have labs (blood work) drawn today and your tests are completely normal, you will receive your results only by: MyChart Message (if you have MyChart) OR A paper copy in the mail If you have any lab test that is abnormal or we need to change your treatment, we will call you to review the results.   Testing/Procedures: Your physician has requested that you have an echocardiogram. Echocardiography is a painless test that uses sound waves to create images of your heart. It provides your doctor with information about the size and shape of your heart and how well your heart's chambers and valves are working. This procedure takes approximately one hour. There are no restrictions for this procedure. Please do NOT wear cologne, perfume, aftershave, or lotions (deodorant is allowed). Please arrive 15 minutes prior to your appointment time.  Please note: We ask at that you not bring children with you during ultrasound (echo/ vascular) testing. Due to room size and safety concerns, children are not allowed in the ultrasound rooms during exams. Our front office staff cannot provide observation of children in our lobby area while testing is being conducted. An adult accompanying a patient to their appointment will only be allowed in the ultrasound room at the discretion of the ultrasound technician under special circumstances. We apologize for any inconvenience.    Follow-Up: At [facility] you and your health needs are our priority.  As part of our continuing mission to provide you with exceptional heart care, we have created designated Provider Care Teams.  These Care Teams  include your primary Cardiologist (physician) and Advanced Practice Providers (APPs -  Physician Assistants and Nurse Practitioners) who all work together to provide you with the care you need, when you need it.  We recommend signing up for the patient portal called "MyChart".  Sign up information is provided on this After Visit Summary.  MyChart is used to connect with patients for Virtual Visits (Telemedicine).  Patients are able to view lab/test results, encounter notes, upcoming appointments, etc.  Non-urgent messages can be sent to your provider as well.   To learn more about what you can do with MyChart, go to [email or web address].    Your next appointment:   !2 months with Dr Elberta Fortis in [place] office

## 2024-02-18 NOTE — Progress Notes (Signed)
 Patient Care Team: Street, Stephanie Coup, MD as PCP - General (Family Medicine) Duke Salvia, MD as PCP - Cardiology (Cardiology)   HPI  Kyle Bowen is a 78 y.o. male Seen in follow-up for Abbott pacemaker implanted 9/19 for complete heart block.  RV apical pacing with both passive and active leads were associate with complex ventricular ectopy; hence, the lead was moved to the HIS Position    Has Myasthenia gravis., largley quiescient.  Has noted some dyspnea on bending but not with normal exertion.  No edema.  No nocturnal dyspnea and chest pain.   Hospitalized 3/24 with syncope and hypotension, found to have COVID  Date Cr K Hgb  9/22 1.03 3.9 14.4  11/22    14.0  10/23 1.1 4.2 13.5  3//24 1.2<<1.45 3.3<<4.1   12/24   14.2     Past Medical History:  Diagnosis Date   Arthritis    Cancer ([facility]) 91   prostate    Complete heart block ([facility]) 07/31/2018   Complication of anesthesia    occ bp drops after surgery whrn getting up    GERD (gastroesophageal reflux disease)    occ   Heart block    History of myasthenia gravis    History of prostate cancer 08/01/2018   Myasthenia ([facility])    Myasthenia gravis without (acute) exacerbation ([facility]) 03/06/2016   Presence of permanent cardiac pacemaker    S/P shoulder replacement 10/14/2014    Past Surgical History:  Procedure Laterality Date   CATARACT EXTRACTION     HERNIA REPAIR Left 05   inguinal   JOINT REPLACEMENT Left 2016   Surgery was in [place]   PACEMAKER IMPLANT N/A 08/05/2018   Procedure: PACEMAKER IMPLANT - Dual Chamber;  Surgeon: Duke Salvia, MD;  Location: [facility];  Service: Cardiovascular;  Laterality: N/A;   PENILE PROSTHESIS IMPLANT  93   00.08 replaced   PROSTATE SURGERY  91   ca   TONSILLECTOMY     TOTAL SHOULDER ARTHROPLASTY Right 10/14/2014   Procedure: RIGHT TOTAL SHOULDER ARTHROPLASTY;  Surgeon: Verlee Rossetti, MD;  [facility];  Service: Orthopedics;  Laterality: Right;    TOTAL SHOULDER ARTHROPLASTY Left 09/03/2019   Procedure: Anatomic TOTAL SHOULDER ARTHROPLASTY;  Surgeon: Beverely Low, MD;  [facility];  Service: Orthopedics;  Laterality: Left;  with interscalene block    Current Meds  Medication Sig   losartan (COZAAR) 25 MG tablet Take 25 mg by mouth daily.   meloxicam (MOBIC) 15 MG tablet Take 15 mg by mouth daily.   mycophenolate (CELLCEPT) 500 MG tablet Take 1,500 mg by mouth See admin instructions. 2000 mg in the morning 1000 mg at bedtime   pantoprazole (PROTONIX) 40 MG tablet Take 40 mg by mouth 2 (two) times daily.   pyridostigmine (MESTINON) 60 MG tablet Take 60 mg by mouth daily as needed (difficulty with tongue).    simvastatin (ZOCOR) 40 MG tablet Take 0.5 tablets (20 mg total) by mouth daily.    Allergies  Allergen Reactions   Aluminum-Containing Compounds Other (See Comments)    Myasthenic patient    Aminoglycosides Other (See Comments)    Myasthenic patient    Azithromycin Other (See Comments)    Myasthenic patient    Botulinum Toxins Other (See Comments)    Myasthenic patient    Calcium Channel Blockers Other (See Comments)    Myasthenic patient    Ciprofloxacin Other (See Comments)    Myasthenic patient  Colistin Other (See Comments)    Myasthenic patient    D Tubocurarine [Tubocurarine] Other (See Comments)    Myasthenic patient    Erythromycin Base Other (See Comments)    Myasthenic patient    Gentamicin Other (See Comments)    Myasthenic patient    Iodinated Contrast Media Other (See Comments)    Myasthenic patient    Kanamycin Other (See Comments)    Myasthenic patient    Macrolides And Ketolides Other (See Comments)    Myasthenic patient    Magnesium-Containing Compounds Other (See Comments)    Myasthenic patient    Moxifloxacin Other (See Comments)    Myasthenic patient    Neomycin Other (See Comments)    Myasthenic patient    Norfloxacin Other (See Comments)    Myasthenic patient    Ofloxacin Other  (See Comments)    Myasthenic patient    Pefloxacin Other (See Comments)    Myasthenic patient    Penicillamine Other (See Comments)    Myasthenic patient    Procainamide Other (See Comments)    Myasthenic patient    Propranolol Other (See Comments)    Myasthenic patient    Quinidine Other (See Comments)    Myasthenic patient    Quinine Derivatives Other (See Comments)    Myasthenic patient    Simethicone Other (See Comments)    Myasthenic patient    Streptomycin Other (See Comments)    Myasthenic patient    Succinylcholine Other (See Comments)    Myasthenic patient    Telithromycin Other (See Comments)    Myasthenic patient    Timolol Other (See Comments)    Myasthenic patient    Tobramycin Other (See Comments)    Myasthenic patient    Vecuronium Other (See Comments)    Myasthenic patient       Review of Systems negative except from HPI and PMH  Physical Exam BP 116/74   Pulse 63   Ht 5\' 7"  (1.702 m)   Wt 175 lb 6.4 oz (79.6 kg)   SpO2 96%   BMI 27.47 kg/m  Well developed and well nourished in no acute distress HENT normal Neck supple with JVP-flat Clear Device pocket well healed; without hematoma or erythema.  There is no tethering  Regular rate and rhythm, no  gallop No  murmur Abd-soft with active BS No Clubbing cyanosis  edema Skin-warm and dry A & Oriented  Grossly normal sensory and motor function  ECG sinus at 63  18/10/42 Incomplete right bundle branch block and left axis deviation   Device function is normal.  Stable albeit low R wave amplitude at 2-2.5 mV. Programming changes none  See Paceart for details      See Paceart for details    Assessment and  Plan Complete heart block-intermittent  Atrial tachycardia  Sinus node dysfunction  Pacemaker-[employer]-His  Ventricular lead deterioration with decreasing R wave amplitude and increasing pacing threshold  Myasthenia gravis  Bendopnea    Interval nonsustained atrial  tach  Continues with intermittent complete heart block.  There are times where for a week or 2 he paces almost always and then for weeks at a time he paces almost never.  Blood pressure not well-controlled.  Measurements are 150s at home.  We do not have recent kidney function work on him, we will repeat his BMET.  With his bendopnea, we will check a BNP.  Also check an echocardiogram suspect some degree of diastolic dysfunction.  Based on the aforementioned testing, we  will adjust his antihypertensives either with the addition or not with the addition of a diuretic.  He may benefit from SGLT2.  He is moving to [place].  He will need to transfer care

## 2024-02-19 LAB — BRAIN NATRIURETIC PEPTIDE: BNP: 2.9 pg/mL (ref 0.0–100.0)

## 2024-02-29 LAB — CUP PACEART INCLINIC DEVICE CHECK
Date Time Interrogation Session: 20250319134025
Implantable Lead Connection Status: 753985
Implantable Lead Connection Status: 753985
Implantable Lead Implant Date: 20190904
Implantable Lead Implant Date: 20190904
Implantable Lead Location: 753859
Implantable Lead Location: 753860
Implantable Lead Model: 3830
Implantable Lead Model: 5076
Implantable Pulse Generator Implant Date: 20190904
Pulse Gen Model: 2272
Pulse Gen Serial Number: 9058775

## 2024-03-15 ENCOUNTER — Encounter: Payer: Self-pay | Admitting: Internal Medicine

## 2024-03-16 ENCOUNTER — Other Ambulatory Visit (HOSPITAL_COMMUNITY)

## 2024-03-16 ENCOUNTER — Other Ambulatory Visit

## 2024-03-24 ENCOUNTER — Other Ambulatory Visit

## 2024-03-25 ENCOUNTER — Ambulatory Visit: Attending: Internal Medicine

## 2024-03-25 DIAGNOSIS — R0600 Dyspnea, unspecified: Secondary | ICD-10-CM | POA: Insufficient documentation

## 2024-03-25 LAB — ECHOCARDIOGRAM COMPLETE: S' Lateral: 2.5 cm

## 2024-03-29 NOTE — Progress Notes (Signed)
 Remote pacemaker transmission.

## 2024-04-19 DIAGNOSIS — C44319 Basal cell carcinoma of skin of other parts of face: Secondary | ICD-10-CM | POA: Diagnosis not present

## 2024-04-19 DIAGNOSIS — D225 Melanocytic nevi of trunk: Secondary | ICD-10-CM | POA: Diagnosis not present

## 2024-04-19 DIAGNOSIS — D485 Neoplasm of uncertain behavior of skin: Secondary | ICD-10-CM | POA: Diagnosis not present

## 2024-04-19 DIAGNOSIS — L570 Actinic keratosis: Secondary | ICD-10-CM | POA: Diagnosis not present

## 2024-04-23 DIAGNOSIS — C4339 Malignant melanoma of other parts of face: Secondary | ICD-10-CM | POA: Diagnosis not present

## 2024-05-08 DIAGNOSIS — C4339 Malignant melanoma of other parts of face: Secondary | ICD-10-CM | POA: Diagnosis not present

## 2024-05-12 ENCOUNTER — Ambulatory Visit (INDEPENDENT_AMBULATORY_CARE_PROVIDER_SITE_OTHER): Payer: Medicare Other

## 2024-05-12 DIAGNOSIS — I442 Atrioventricular block, complete: Secondary | ICD-10-CM

## 2024-05-13 ENCOUNTER — Ambulatory Visit: Attending: Cardiology

## 2024-05-13 ENCOUNTER — Telehealth: Payer: Self-pay

## 2024-05-13 DIAGNOSIS — I442 Atrioventricular block, complete: Secondary | ICD-10-CM

## 2024-05-13 LAB — CUP PACEART REMOTE DEVICE CHECK
Battery Remaining Longevity: 21 mo
Battery Remaining Percentage: 24 %
Battery Voltage: 2.92 V
Brady Statistic AP VP Percent: 3.7 %
Brady Statistic AP VS Percent: 29 %
Brady Statistic AS VP Percent: 15 %
Brady Statistic AS VS Percent: 52 %
Brady Statistic RA Percent Paced: 31 %
Brady Statistic RV Percent Paced: 19 %
Date Time Interrogation Session: 20250611020015
Implantable Lead Connection Status: 753985
Implantable Lead Connection Status: 753985
Implantable Lead Implant Date: 20190904
Implantable Lead Implant Date: 20190904
Implantable Lead Location: 753859
Implantable Lead Location: 753860
Implantable Lead Model: 3830
Implantable Lead Model: 5076
Implantable Pulse Generator Implant Date: 20190904
Lead Channel Impedance Value: 350 Ohm
Lead Channel Impedance Value: 390 Ohm
Lead Channel Pacing Threshold Amplitude: 0.5 V
Lead Channel Pacing Threshold Amplitude: 0.75 V
Lead Channel Pacing Threshold Pulse Width: 0.5 ms
Lead Channel Pacing Threshold Pulse Width: 1 ms
Lead Channel Sensing Intrinsic Amplitude: 1.5 mV
Lead Channel Sensing Intrinsic Amplitude: 4.6 mV
Lead Channel Setting Pacing Amplitude: 2 V
Lead Channel Setting Pacing Amplitude: 5 V
Lead Channel Setting Pacing Pulse Width: 1 ms
Lead Channel Setting Sensing Sensitivity: 1 mV
Pulse Gen Model: 2272
Pulse Gen Serial Number: 9058775

## 2024-05-13 NOTE — Progress Notes (Signed)
 Pt brought in for RV lead assessment. Industry present. Reviewed w/ Dr. Daneil Dunker, changes reviewed. Plan is for patient to see Daneil Dunker in the next month to establish and reassess. Pt is ASYMPTOMATIC relative to concern for pauses r/t oversensing and underpacing. Questionable for intermittant complete heart block. CJL. * Noise reproducible w/ isometrics. *  Changes made this session and as follows.  - RV autocap disabled  - RV output lowered to 2.5V--0.83ms Changes made per Industry .

## 2024-05-13 NOTE — Telephone Encounter (Signed)
 Pacemaker-Scheduled remote reviewed. Normal device function.  Presenting rhythm: AP/VP 13 AMS, EGMs c/w AT, longest 10 min 48 sec 8 episodes true RV noise, RV autocapture programmed to 5V at 1 ms, VP 19%, sent to triage    Next remote 91 days.  ML, CVRS.  Spoke w/ patient. Appointment made in DC for 10 AM today.

## 2024-05-28 DIAGNOSIS — R300 Dysuria: Secondary | ICD-10-CM | POA: Diagnosis not present

## 2024-05-28 DIAGNOSIS — Z6827 Body mass index (BMI) 27.0-27.9, adult: Secondary | ICD-10-CM | POA: Diagnosis not present

## 2024-06-07 DIAGNOSIS — C44319 Basal cell carcinoma of skin of other parts of face: Secondary | ICD-10-CM | POA: Diagnosis not present

## 2024-06-09 NOTE — H&P (View-Only) (Signed)
 Electrophysiology Office Note:   Date:  06/12/2024  ID:  Kyle Bowen, DOB 10-30-1946, MRN [id number]  Primary Cardiologist: Elspeth Sage, MD Electrophysiologist: Fonda Kitty, MD      History of Present Illness:   Kyle Bowen is a 78 y.o. male with h/o intermittent CHB s/p dual chamber pacemaker, sinus node dysfunction, myasthenia gravis who is being seen today for evaluation of RV lead noise.   Discussed the use of AI scribe software for clinical note transcription with the patient, who gave verbal consent to proceed.  History of Present Illness Kyle Bowen is a 78 year old male with a pacemaker who presents with issues related to the pacemaker lead in the bottom chamber of his heart.  He has been experiencing issues with the pacemaker lead in the bottom chamber of his heart. The lead, placed in 2019, has developed fibrosis around it, and there have been issues with sensing and noise interference.  He has a history of intermittent complete heart block, making him dependent on the pacemaker at times. There are periods when he uses the pacemaker continuously and other times when he does not need it.  His current pacemaker system is not MRI compatible due to mismatched leads and can. Although he has not required an MRI in the past, future needs are uncertain. The lead's location in the heart has previously caused issues, and he has been informed that the presence of foreign material in the bloodstream can increase the risk of infection.  He has a history of myasthenia gravis, which may affect anesthesia management during any potential procedures. He has undergone approximately 20 surgeries in the past.  He is in the process of relocating to [place], which may impact the timing of any necessary interventions.   Review of systems complete and found to be negative unless listed in HPI.   EP Information / Studies Reviewed:    EKG is not ordered today. EKG from 02/18/24  reviewed which showed sinus rhythm with incomplete RBBB, PR and QRS 96ms.      Echo 03/2024:   1. Left ventricular ejection fraction, by estimation, is 55 to 60%. The  left ventricle has normal function. The left ventricle has no regional  wall motion abnormalities. There is mild left ventricular hypertrophy.  Left ventricular diastolic parameters  are consistent with Grade II diastolic dysfunction (pseudonormalization).  The average left ventricular global longitudinal strain is -13.0 %. The  global longitudinal strain is abnormal.   2. Right ventricular systolic function is normal. The right ventricular  size is normal. There is normal pulmonary artery systolic pressure.   3. The mitral valve is normal in structure. Mild mitral valve  regurgitation. No evidence of mitral stenosis.   4. The aortic valve is normal in structure. Aortic valve regurgitation is  not visualized. No aortic stenosis is present.   5. The inferior vena cava is normal in size with greater than 50%  respiratory variability, suggesting right atrial pressure of 3 mmHg.    Physical Exam:   VS:  BP (!) 143/81   Pulse 61   Ht 5' 7 (1.702 m)   Wt 170 lb (77.1 kg)   SpO2 95%   BMI 26.63 kg/m    Wt Readings from Last 3 Encounters:  06/10/24 170 lb (77.1 kg)  02/18/24 175 lb 6.4 oz (79.6 kg)  01/13/23 182 lb 6.4 oz (82.7 kg)     GEN: Well nourished, well developed in no acute distress NECK:  No JVD CARDIAC: Normal rate, regular. Well healed left chest pacer pocket. RESPIRATORY:  Clear to auscultation without rales, wheezing or rhonchi  ABDOMEN: Soft, non-distended EXTREMITIES:  No edema; No deformity   ASSESSMENT AND PLAN:    #. RV lead noise / pacemaker lead malfunction #. Intermittent complete heart block s/p dual chamber pacemaker: RV lead is in the His bundle position. Prior apical pacing resulted in frequent ectopy. - In clinic device interrogation performed today. Redemonstration of RV lead  noise, appropriately detected and treated by noise reversion algorithms. Chronically low RV sensing as well. Presenting AS-VS.  -  Given that he has intermittent dependence of his RV lead, safest path forward is to replace his current RV lead as it appears to be failing. We discussed options for abandoning RV lead and reimplanting new lead, with trade off of higher infectious risk and lack of MRI compatibility vs extracting current RV lead and reimplanting new RV lead, with trade off of higher procedural risk, including need for emergent open heart surgery and even death. Patient voiced understanding and is leaning towards extraction and reimplant. He would like to think about his options further and discuss with his wife.  - If he decides on extraction then would like pre-extraction CT scan. And will need to arrange procedure to be done with Cardiac Surgery backup.  - Continue remote monitoring.  #Hypertension At goal today.  Recommend checking blood pressures 1-2 times per week at home and recording the values.  Recommend bringing these recordings to the primary care physician.  #Myasthenia gravis: Symptoms well managed. May complicate anesthesia management. - Coordinate with anesthesiology to adjust sedation plan for any procedure, considering his myasthenia gravis.  Follow up with Dr. Kennyth 3 months after procedure.   Signed, Fonda Kennyth, MD

## 2024-06-09 NOTE — Progress Notes (Signed)
 Electrophysiology Office Note:   Date:  06/12/2024  ID:  Kyle Bowen, DOB 10-30-1946, MRN [id number]  Primary Cardiologist: Elspeth Sage, MD Electrophysiologist: Fonda Kitty, MD      History of Present Illness:   Kyle Bowen is a 78 y.o. male with h/o intermittent CHB s/p dual chamber pacemaker, sinus node dysfunction, myasthenia gravis who is being seen today for evaluation of RV lead noise.   Discussed the use of AI scribe software for clinical note transcription with the patient, who gave verbal consent to proceed.  History of Present Illness Kyle Bowen is a 78 year old male with a pacemaker who presents with issues related to the pacemaker lead in the bottom chamber of his heart.  He has been experiencing issues with the pacemaker lead in the bottom chamber of his heart. The lead, placed in 2019, has developed fibrosis around it, and there have been issues with sensing and noise interference.  He has a history of intermittent complete heart block, making him dependent on the pacemaker at times. There are periods when he uses the pacemaker continuously and other times when he does not need it.  His current pacemaker system is not MRI compatible due to mismatched leads and can. Although he has not required an MRI in the past, future needs are uncertain. The lead's location in the heart has previously caused issues, and he has been informed that the presence of foreign material in the bloodstream can increase the risk of infection.  He has a history of myasthenia gravis, which may affect anesthesia management during any potential procedures. He has undergone approximately 20 surgeries in the past.  He is in the process of relocating to [place], which may impact the timing of any necessary interventions.   Review of systems complete and found to be negative unless listed in HPI.   EP Information / Studies Reviewed:    EKG is not ordered today. EKG from 02/18/24  reviewed which showed sinus rhythm with incomplete RBBB, PR and QRS 96ms.      Echo 03/2024:   1. Left ventricular ejection fraction, by estimation, is 55 to 60%. The  left ventricle has normal function. The left ventricle has no regional  wall motion abnormalities. There is mild left ventricular hypertrophy.  Left ventricular diastolic parameters  are consistent with Grade II diastolic dysfunction (pseudonormalization).  The average left ventricular global longitudinal strain is -13.0 %. The  global longitudinal strain is abnormal.   2. Right ventricular systolic function is normal. The right ventricular  size is normal. There is normal pulmonary artery systolic pressure.   3. The mitral valve is normal in structure. Mild mitral valve  regurgitation. No evidence of mitral stenosis.   4. The aortic valve is normal in structure. Aortic valve regurgitation is  not visualized. No aortic stenosis is present.   5. The inferior vena cava is normal in size with greater than 50%  respiratory variability, suggesting right atrial pressure of 3 mmHg.    Physical Exam:   VS:  BP (!) 143/81   Pulse 61   Ht 5' 7 (1.702 m)   Wt 170 lb (77.1 kg)   SpO2 95%   BMI 26.63 kg/m    Wt Readings from Last 3 Encounters:  06/10/24 170 lb (77.1 kg)  02/18/24 175 lb 6.4 oz (79.6 kg)  01/13/23 182 lb 6.4 oz (82.7 kg)     GEN: Well nourished, well developed in no acute distress NECK:  No JVD CARDIAC: Normal rate, regular. Well healed left chest pacer pocket. RESPIRATORY:  Clear to auscultation without rales, wheezing or rhonchi  ABDOMEN: Soft, non-distended EXTREMITIES:  No edema; No deformity   ASSESSMENT AND PLAN:    #. RV lead noise / pacemaker lead malfunction #. Intermittent complete heart block s/p dual chamber pacemaker: RV lead is in the His bundle position. Prior apical pacing resulted in frequent ectopy. - In clinic device interrogation performed today. Redemonstration of RV lead  noise, appropriately detected and treated by noise reversion algorithms. Chronically low RV sensing as well. Presenting AS-VS.  -  Given that he has intermittent dependence of his RV lead, safest path forward is to replace his current RV lead as it appears to be failing. We discussed options for abandoning RV lead and reimplanting new lead, with trade off of higher infectious risk and lack of MRI compatibility vs extracting current RV lead and reimplanting new RV lead, with trade off of higher procedural risk, including need for emergent open heart surgery and even death. Patient voiced understanding and is leaning towards extraction and reimplant. He would like to think about his options further and discuss with his wife.  - If he decides on extraction then would like pre-extraction CT scan. And will need to arrange procedure to be done with Cardiac Surgery backup.  - Continue remote monitoring.  #Hypertension At goal today.  Recommend checking blood pressures 1-2 times per week at home and recording the values.  Recommend bringing these recordings to the primary care physician.  #Myasthenia gravis: Symptoms well managed. May complicate anesthesia management. - Coordinate with anesthesiology to adjust sedation plan for any procedure, considering his myasthenia gravis.  Follow up with Dr. Kennyth 3 months after procedure.   Signed, Fonda Kennyth, MD

## 2024-06-10 ENCOUNTER — Telehealth: Payer: Self-pay | Admitting: Cardiology

## 2024-06-10 ENCOUNTER — Encounter: Payer: Self-pay | Admitting: Cardiology

## 2024-06-10 ENCOUNTER — Ambulatory Visit: Attending: Cardiology | Admitting: Cardiology

## 2024-06-10 VITALS — BP 143/81 | HR 61 | Ht 67.0 in | Wt 170.0 lb

## 2024-06-10 DIAGNOSIS — T82110A Breakdown (mechanical) of cardiac electrode, initial encounter: Secondary | ICD-10-CM | POA: Diagnosis present

## 2024-06-10 DIAGNOSIS — I495 Sick sinus syndrome: Secondary | ICD-10-CM | POA: Insufficient documentation

## 2024-06-10 DIAGNOSIS — I10 Essential (primary) hypertension: Secondary | ICD-10-CM | POA: Diagnosis present

## 2024-06-10 DIAGNOSIS — I442 Atrioventricular block, complete: Secondary | ICD-10-CM | POA: Insufficient documentation

## 2024-06-10 DIAGNOSIS — Z8669 Personal history of other diseases of the nervous system and sense organs: Secondary | ICD-10-CM | POA: Insufficient documentation

## 2024-06-10 DIAGNOSIS — Z950 Presence of cardiac pacemaker: Secondary | ICD-10-CM | POA: Diagnosis present

## 2024-06-10 LAB — CUP PACEART INCLINIC DEVICE CHECK
Battery Remaining Longevity: 28 mo
Battery Voltage: 2.93 V
Brady Statistic RA Percent Paced: 33 %
Brady Statistic RV Percent Paced: 20 %
Date Time Interrogation Session: 20250710103853
Implantable Lead Connection Status: 753985
Implantable Lead Connection Status: 753985
Implantable Lead Implant Date: 20190904
Implantable Lead Implant Date: 20190904
Implantable Lead Location: 753859
Implantable Lead Location: 753860
Implantable Lead Model: 3830
Implantable Lead Model: 5076
Implantable Pulse Generator Implant Date: 20190904
Lead Channel Impedance Value: 362.5 Ohm
Lead Channel Impedance Value: 425 Ohm
Lead Channel Pacing Threshold Amplitude: 0.5 V
Lead Channel Pacing Threshold Amplitude: 0.5 V
Lead Channel Pacing Threshold Amplitude: 0.75 V
Lead Channel Pacing Threshold Amplitude: 0.75 V
Lead Channel Pacing Threshold Pulse Width: 0.5 ms
Lead Channel Pacing Threshold Pulse Width: 0.5 ms
Lead Channel Pacing Threshold Pulse Width: 0.5 ms
Lead Channel Pacing Threshold Pulse Width: 0.5 ms
Lead Channel Sensing Intrinsic Amplitude: 1.9 mV
Lead Channel Sensing Intrinsic Amplitude: 5 mV
Lead Channel Setting Pacing Amplitude: 2 V
Lead Channel Setting Pacing Amplitude: 2.5 V
Lead Channel Setting Pacing Pulse Width: 0.5 ms
Lead Channel Setting Sensing Sensitivity: 0.5 mV
Pulse Gen Model: 2272
Pulse Gen Serial Number: 9058775

## 2024-06-10 NOTE — Patient Instructions (Addendum)
 Medication Instructions:  Your physician recommends that you continue on your current medications as directed. Please refer to the Current Medication list given to you today.  *If you need a refill on your cardiac medications before your next appointment, please call your pharmacy*  Testing/Procedures: Pacemaker Upgrade or Extraction - please give us  a call at [phone] when you decide how you would like to proceed.  Follow-Up: At [facility], you and your health needs are our priority.  As part of our continuing mission to provide you with exceptional heart care, our providers are all part of one team.  This team includes your primary Cardiologist (physician) and Advanced Practice Providers or APPs (Physician Assistants and Nurse Practitioners) who all work together to provide you with the care you need, when you need it.  Your next appointment:   We will contact you to arrange your follow up appointments

## 2024-06-10 NOTE — Telephone Encounter (Signed)
 Pt called in to speak to April, CMA to schedule procedure.

## 2024-06-11 NOTE — Telephone Encounter (Signed)
 I spoke with pt and he has decided he wants to have the lead extracted and new lead placed.   I informed him that I would work on getting this scheduled and will be in touch with him as soon as I can get it arranged with the Surgeon.

## 2024-06-14 ENCOUNTER — Telehealth: Payer: Self-pay

## 2024-06-14 DIAGNOSIS — I498 Other specified cardiac arrhythmias: Secondary | ICD-10-CM

## 2024-06-14 DIAGNOSIS — I442 Atrioventricular block, complete: Secondary | ICD-10-CM

## 2024-06-14 DIAGNOSIS — T82110A Breakdown (mechanical) of cardiac electrode, initial encounter: Secondary | ICD-10-CM

## 2024-06-14 NOTE — Telephone Encounter (Signed)
 Pt is scheduled for PPM RV lead extraction (MDT) and Reimplant (SJ) with PPM Gen Change on 8/8 at 3 pm with Dr. Kennyth. Dr. Shyrl will be back-up.  Pt is scheduled on 7/25 for lead extraction CT and will have labs done same day. He will get surgical scrub while here that day.

## 2024-06-20 ENCOUNTER — Ambulatory Visit: Payer: Self-pay | Admitting: Cardiology

## 2024-06-21 DIAGNOSIS — Z135 Encounter for screening for eye and ear disorders: Secondary | ICD-10-CM | POA: Diagnosis not present

## 2024-06-21 DIAGNOSIS — H524 Presbyopia: Secondary | ICD-10-CM | POA: Diagnosis not present

## 2024-06-21 DIAGNOSIS — H43393 Other vitreous opacities, bilateral: Secondary | ICD-10-CM | POA: Diagnosis not present

## 2024-06-21 DIAGNOSIS — Z961 Presence of intraocular lens: Secondary | ICD-10-CM | POA: Diagnosis not present

## 2024-06-21 NOTE — Telephone Encounter (Signed)
-----   Message from Fonda Kitty sent at 06/18/2024  8:55 PM EDT ----- Regarding: RE: Lead Extraction We can forgo the CT in light of his CKD and possible allergy.  Josh ----- Message ----- From: Dreama Earing, CMA Sent: 06/15/2024   2:49 PM EDT To: Aldona FORBES Marina, RN; Fonda Kitty, MD Subject: Lead Extraction                                Pt is scheduled on 8/8 for a lead extraction. His CT is scheduled on 7/25.  In the pt's chart under allergies it list Iodinated Contrast (Contraindication) because the patient has a dx of Myasthenia Gravis.  I spoke with the Pharmacist Lolita and she looked it up and said pt's with that dx, if they get this contrast it can aggravate the symptoms but it's <5% chance.   The pt states he has not had a problem in the past.   Are you ok with him having the lead extraction CT with Contrast?  Ortha Metts

## 2024-06-21 NOTE — Telephone Encounter (Signed)
 I called and informed the patient that I would be cancelling his CT Scan. He will still come and get his Labs done and pick up surgical scrub.

## 2024-06-25 ENCOUNTER — Encounter (HOSPITAL_COMMUNITY): Payer: Self-pay

## 2024-06-25 ENCOUNTER — Ambulatory Visit (HOSPITAL_COMMUNITY)

## 2024-06-25 DIAGNOSIS — I442 Atrioventricular block, complete: Secondary | ICD-10-CM | POA: Diagnosis not present

## 2024-06-25 DIAGNOSIS — T82110A Breakdown (mechanical) of cardiac electrode, initial encounter: Secondary | ICD-10-CM | POA: Diagnosis not present

## 2024-06-25 LAB — CBC

## 2024-06-26 LAB — CBC
Hematocrit: 41.8 % (ref 37.5–51.0)
Hemoglobin: 13.8 g/dL (ref 13.0–17.7)
MCH: 31.2 pg (ref 26.6–33.0)
MCHC: 33 g/dL (ref 31.5–35.7)
MCV: 94 fL (ref 79–97)
Platelets: 172 x10E3/uL (ref 150–450)
RBC: 4.43 x10E6/uL (ref 4.14–5.80)
RDW: 12.2 % (ref 11.6–15.4)
WBC: 5.4 x10E3/uL (ref 3.4–10.8)

## 2024-06-26 LAB — BASIC METABOLIC PANEL WITH GFR
BUN/Creatinine Ratio: 15 (ref 10–24)
BUN: 16 mg/dL (ref 8–27)
CO2: 19 mmol/L — ABNORMAL LOW (ref 20–29)
Calcium: 9.1 mg/dL (ref 8.6–10.2)
Chloride: 104 mmol/L (ref 96–106)
Creatinine, Ser: 1.08 mg/dL (ref 0.76–1.27)
Glucose: 82 mg/dL (ref 70–99)
Potassium: 4.4 mmol/L (ref 3.5–5.2)
Sodium: 139 mmol/L (ref 134–144)
eGFR: 70 mL/min/1.73 (ref >59)

## 2024-06-28 ENCOUNTER — Ambulatory Visit: Payer: Self-pay | Admitting: Cardiology

## 2024-07-01 ENCOUNTER — Telehealth (HOSPITAL_COMMUNITY): Payer: Self-pay

## 2024-07-01 NOTE — Telephone Encounter (Signed)
 Spoke with patient to discuss upcoming procedure.   Confirmed patient is scheduled for a Lead Extraction, Lead Insertion, PPM Generator Changeout on Friday, August 8 with Dr. Sidra Kitty. Instructed patient to arrive at the Main Entrance A at [facility]: [address] [place], [place] [address] and check in at Admitting at 1:00 PM.   Labs completed  Any recent signs of acute illness or been started on antibiotics?  No Any new medications started? No Any medications to hold? No On the morning of your procedure, you may have a LIGHT breakfast and may take your morning medications prior to 7:00 AM. Nothing to eat or drink after 7:00 AM.   The night before your procedure and the morning of your procedure, wash thoroughly with the CHG surgical soap from the neck down, paying special attention to the area where your procedure will be performed.  Advised of plan to stay overnight. You MUST have a responsible adult to drive you home upon discharge.   Patient verbalized understanding to all instructions provided and agreed to proceed with procedure.

## 2024-07-08 ENCOUNTER — Telehealth: Payer: Self-pay

## 2024-07-08 NOTE — Progress Notes (Signed)
 Anesthesia Chart Review: Same day workup  78 year old male follows with neurology at [facility] for history of seropositive oculo-bulbar myasthenia gravis diagnosed in 2017.  He has never had limb symptoms.  Last seen 11/03/2023 by Dr. Von Borg [facility] and noted to be stable at that time, no current myasthenia gravis symptoms.  He was continued on mycophenolate  250 mg twice daily and as needed Mestinon  60 mg (reportedly only using a few times per year). He is also on protonix 40mg  daily for GERD.  Follows with cardiology for history of HTN, intermittent CHB s/p dual-chamber PPM.  Last seen by Dr. Kennyth on 06/10/2024 admitted that he has been having lead problems due to fibrosis with subsequent sensing issues and noise interference.  He is recommended to undergo lead extraction and replacement with generator change out to MRI compliant material.  Echo 03/2024 showed EF 55 to 60%, grade 2 DD, normal RV, mild mitral regurgitation.  BMP and CBC 06/25/2024 reviewed, unremarkable.  EKG 02/18/2024: Normal sinus rhythm.  Rate 63. Incomplete right bundle branch block. Left anterior fascicular block  Echo 03/25/2024:   1. Left ventricular ejection fraction, by estimation, is 55 to 60%. The  left ventricle has normal function. The left ventricle has no regional  wall motion abnormalities. There is mild left ventricular hypertrophy.  Left ventricular diastolic parameters  are consistent with Grade II diastolic dysfunction (pseudonormalization).  The average left ventricular global longitudinal strain is -13.0 %. The  global longitudinal strain is abnormal.   2. Right ventricular systolic function is normal. The right ventricular  size is normal. There is normal pulmonary artery systolic pressure.   3. The mitral valve is normal in structure. Mild mitral valve  regurgitation. No evidence of mitral stenosis.   4. The aortic valve is normal in structure. Aortic valve regurgitation is  not visualized. No aortic  stenosis is present.   5. The inferior vena cava is normal in size with greater than 50%  respiratory variability, suggesting right atrial pressure of 3 mmHg.      Lynwood Geofm RIGGERS [facility]/Anesthesiology Phone [phone] 07/08/2024 1:14 PM

## 2024-07-08 NOTE — Anesthesia Preprocedure Evaluation (Addendum)
 Anesthesia Evaluation  Patient identified by MRN, date of birth, ID band Patient awake    Reviewed: Allergy & Precautions, NPO status , Patient's Chart, lab work & pertinent test results  History of Anesthesia Complications (+) history of anesthetic complications (occasional issues w/ hypotension)  Airway Mallampati: III  TM Distance: >3 FB Neck ROM: Full    Dental  (+) Dental Advisory Given   Pulmonary neg pulmonary ROS   breath sounds clear to auscultation       Cardiovascular + dysrhythmias (CHB) + pacemaker (2019)  Rhythm:Regular  EKG 02/18/2024: Normal sinus rhythm.  Rate 63. Incomplete right bundle branch block. Left anterior fascicular block   Echo 03/25/2024:   1. Left ventricular ejection fraction, by estimation, is 55 to 60%. The  left ventricle has normal function. The left ventricle has no regional  wall motion abnormalities. There is mild left ventricular hypertrophy.  Left ventricular diastolic parameters  are consistent with Grade II diastolic dysfunction (pseudonormalization).  The average left ventricular global longitudinal strain is -13.0 %. The  global longitudinal strain is abnormal.   2. Right ventricular systolic function is normal. The right ventricular  size is normal. There is normal pulmonary artery systolic pressure.   3. The mitral valve is normal in structure. Mild mitral valve  regurgitation. No evidence of mitral stenosis.   4. The aortic valve is normal in structure. Aortic valve regurgitation is  not visualized. No aortic stenosis is present.   5. The inferior vena cava is normal in size with greater than 50%  respiratory variability, suggesting right atrial pressure of 3 mmHg.     Neuro/Psych history of seropositive oculo-bulbar myasthenia gravis diagnosed in 2017.  He has never had limb symptoms.  Last seen 11/03/2023 by Dr. Von Borg [facility] and noted to be stable at that time, no current  myasthenia gravis symptoms.  He was continued on mycophenolate  250 mg twice daily and as needed Mestinon  60 mg (reportedly only using a few times per year).   Neuromuscular disease (myasthenia gravis)  negative psych ROS   GI/Hepatic Neg liver ROS,GERD  Controlled and Medicated,,  Endo/Other  negative endocrine ROS    Renal/GU negative Renal ROS     Musculoskeletal  (+) Arthritis , Osteoarthritis,    Abdominal   Peds  Hematology negative hematology ROS (+)   Anesthesia Other Findings   Reproductive/Obstetrics                              Anesthesia Physical Anesthesia Plan  ASA: 3  Anesthesia Plan: General   Post-op Pain Management:    Induction: Intravenous  PONV Risk Score and Plan: 2 and Ondansetron , Dexamethasone  and Treatment may vary due to age or medical condition  Airway Management Planned: Oral ETT  Additional Equipment: Arterial line and TEE  Intra-op Plan:   Post-operative Plan: Extubation in OR  Informed Consent: I have reviewed the patients History and Physical, chart, labs and discussed the procedure including the risks, benefits and alternatives for the proposed anesthesia with the patient or authorized representative who has indicated his/her understanding and acceptance.     Dental advisory given  Plan Discussed with: CRNA  Anesthesia Plan Comments: (Last airway note: entilation: Mask ventilation without difficulty Laryngoscope Size: 2 and Miller Grade View: Grade I Tube type: Oral Tube size: 7.5 mm Number of attempts: 1 )         Anesthesia Quick Evaluation

## 2024-07-08 NOTE — Telephone Encounter (Signed)
 Called pt and went over Instructions for his lead extraction tomorrow 8/7 with Dr. Kennyth.   Pt is aware of instructions below.

## 2024-07-09 ENCOUNTER — Ambulatory Visit (HOSPITAL_COMMUNITY)
Admission: RE | Admit: 2024-07-09 | Discharge: 2024-07-10 | Disposition: A | Discharge status: Home / Self Care | Attending: Cardiology | Admitting: Cardiology

## 2024-07-09 ENCOUNTER — Encounter (HOSPITAL_COMMUNITY)
Admission: RE | Disposition: A | Discharge status: Home / Self Care | Payer: Self-pay | Source: Home / Self Care | Attending: Cardiology

## 2024-07-09 ENCOUNTER — Ambulatory Visit (HOSPITAL_COMMUNITY): Payer: Self-pay | Admitting: Certified Registered Nurse Anesthetist

## 2024-07-09 ENCOUNTER — Other Ambulatory Visit: Payer: Self-pay

## 2024-07-09 ENCOUNTER — Ambulatory Visit (HOSPITAL_COMMUNITY)

## 2024-07-09 ENCOUNTER — Encounter (HOSPITAL_COMMUNITY): Payer: Self-pay | Admitting: Cardiology

## 2024-07-09 DIAGNOSIS — T82110A Breakdown (mechanical) of cardiac electrode, initial encounter: Secondary | ICD-10-CM | POA: Diagnosis not present

## 2024-07-09 DIAGNOSIS — Z950 Presence of cardiac pacemaker: Secondary | ICD-10-CM | POA: Diagnosis not present

## 2024-07-09 DIAGNOSIS — I442 Atrioventricular block, complete: Secondary | ICD-10-CM | POA: Diagnosis not present

## 2024-07-09 DIAGNOSIS — I10 Essential (primary) hypertension: Secondary | ICD-10-CM | POA: Diagnosis not present

## 2024-07-09 DIAGNOSIS — G7000 Myasthenia gravis without (acute) exacerbation: Secondary | ICD-10-CM | POA: Insufficient documentation

## 2024-07-09 DIAGNOSIS — T82118A Breakdown (mechanical) of other cardiac electronic device, initial encounter: Secondary | ICD-10-CM | POA: Diagnosis not present

## 2024-07-09 DIAGNOSIS — K219 Gastro-esophageal reflux disease without esophagitis: Secondary | ICD-10-CM | POA: Insufficient documentation

## 2024-07-09 DIAGNOSIS — I088 Other rheumatic multiple valve diseases: Secondary | ICD-10-CM | POA: Insufficient documentation

## 2024-07-09 DIAGNOSIS — E785 Hyperlipidemia, unspecified: Secondary | ICD-10-CM

## 2024-07-09 DIAGNOSIS — Z01818 Encounter for other preprocedural examination: Secondary | ICD-10-CM

## 2024-07-09 HISTORY — PX: PPM GENERATOR CHANGEOUT: EP1233

## 2024-07-09 HISTORY — PX: LEAD INSERTION: EP1212

## 2024-07-09 HISTORY — PX: LEAD EXTRACTION: EP1211

## 2024-07-09 LAB — ABO/RH: ABO/RH(D): O POS

## 2024-07-09 LAB — SURGICAL PCR SCREEN
MRSA, PCR: NEGATIVE
Staphylococcus aureus: NEGATIVE

## 2024-07-09 LAB — PREPARE RBC (CROSSMATCH)

## 2024-07-09 SURGERY — LEAD EXTRACTION
Anesthesia: General

## 2024-07-09 MED ORDER — SODIUM CHLORIDE 0.9 % IV SOLN: INTRAVENOUS | Status: DC

## 2024-07-09 MED ORDER — OXYCODONE HCL 5 MG PO TABS: 5.0000 mg | ORAL_TABLET | Freq: Once | ORAL | Status: DC | PRN

## 2024-07-09 MED ORDER — ONDANSETRON HCL 4 MG/2ML IJ SOLN: 4.0000 mg | Freq: Once | INTRAMUSCULAR | Status: DC | PRN

## 2024-07-09 MED ORDER — HEPARIN (PORCINE) IN NACL 1000-0.9 UT/500ML-% IV SOLN
INTRAVENOUS | Status: DC | PRN
  Administered 2024-07-09: 500 mL

## 2024-07-09 MED ORDER — ONDANSETRON HCL 4 MG/2ML IJ SOLN
INTRAMUSCULAR | Status: DC | PRN
  Administered 2024-07-09: 4 mg via INTRAVENOUS

## 2024-07-09 MED ORDER — FENTANYL CITRATE (PF) 250 MCG/5ML IJ SOLN
INTRAMUSCULAR | Status: DC | PRN
  Administered 2024-07-09 (×2): 50 ug via INTRAVENOUS

## 2024-07-09 MED ORDER — SODIUM CHLORIDE 0.9% IV SOLUTION: Freq: Once | INTRAVENOUS | Status: DC

## 2024-07-09 MED ORDER — CHLORHEXIDINE GLUCONATE 4 % EX SOLN
4.0000 | Freq: Once | CUTANEOUS | Status: DC
  Filled 2024-07-09: qty 60

## 2024-07-09 MED ORDER — SODIUM CHLORIDE 0.9 % IV SOLN: 80.0000 mg | INTRAVENOUS | Status: DC

## 2024-07-09 MED ORDER — OXYCODONE HCL 5 MG/5ML PO SOLN: 5.0000 mg | Freq: Once | ORAL | Status: DC | PRN

## 2024-07-09 MED ORDER — FENTANYL CITRATE (PF) 100 MCG/2ML IJ SOLN
25.0000 ug | INTRAMUSCULAR | Status: DC | PRN
  Administered 2024-07-09: 50 ug via INTRAVENOUS

## 2024-07-09 MED ORDER — CHLORHEXIDINE GLUCONATE 0.12 % MT SOLN
OROMUCOSAL | Status: AC
  Filled 2024-07-09: qty 15

## 2024-07-09 MED ORDER — FENTANYL CITRATE (PF) 100 MCG/2ML IJ SOLN
INTRAMUSCULAR | Status: AC
Start: 2024-07-09 — End: 2024-07-09
  Filled 2024-07-09: qty 2

## 2024-07-09 MED ORDER — POVIDONE-IODINE 10 % EX SWAB
2.0000 | Freq: Once | CUTANEOUS | Status: AC
  Administered 2024-07-09: 2 via TOPICAL

## 2024-07-09 MED ORDER — LIDOCAINE 2% (20 MG/ML) 5 ML SYRINGE
INTRAMUSCULAR | Status: DC | PRN
  Administered 2024-07-09: 60 mg via INTRAVENOUS

## 2024-07-09 MED ORDER — ALBUMIN HUMAN 5 % IV SOLN: INTRAVENOUS | Status: DC | PRN

## 2024-07-09 MED ORDER — AMIODARONE IV BOLUS ONLY 150 MG/100ML
INTRAVENOUS | Status: DC | PRN
Start: 2024-07-09 — End: 2024-07-09
  Administered 2024-07-09: 150 mg via INTRAVENOUS

## 2024-07-09 MED ORDER — CEFAZOLIN SODIUM-DEXTROSE 2-4 GM/100ML-% IV SOLN
2.0000 g | INTRAVENOUS | Status: AC
  Administered 2024-07-09: 2 g via INTRAVENOUS

## 2024-07-09 MED ORDER — SUGAMMADEX SODIUM 200 MG/2ML IV SOLN
INTRAVENOUS | Status: DC | PRN
Start: 2024-07-09 — End: 2024-07-09
  Administered 2024-07-09: 200 mg via INTRAVENOUS
  Administered 2024-07-09: 100 mg via INTRAVENOUS

## 2024-07-09 MED ORDER — SODIUM CHLORIDE 0.9 % IV SOLN
INTRAVENOUS | Status: AC
  Filled 2024-07-09: qty 2

## 2024-07-09 MED ORDER — CEFAZOLIN SODIUM-DEXTROSE 2-4 GM/100ML-% IV SOLN
INTRAVENOUS | Status: AC
  Filled 2024-07-09: qty 100

## 2024-07-09 MED ORDER — ROCURONIUM BROMIDE 10 MG/ML (PF) SYRINGE
PREFILLED_SYRINGE | INTRAVENOUS | Status: DC | PRN
  Administered 2024-07-09: 10 mg via INTRAVENOUS
  Administered 2024-07-09: 70 mg via INTRAVENOUS

## 2024-07-09 MED ORDER — FENTANYL CITRATE (PF) 100 MCG/2ML IJ SOLN
INTRAMUSCULAR | Status: AC
  Filled 2024-07-09: qty 2

## 2024-07-09 MED ORDER — PHENYLEPHRINE HCL-NACL 20-0.9 MG/250ML-% IV SOLN
INTRAVENOUS | Status: DC | PRN
  Administered 2024-07-09: 25 ug/min via INTRAVENOUS

## 2024-07-09 MED ORDER — VANCOMYCIN HCL 1000 MG IV SOLR
INTRAVENOUS | Status: AC
  Filled 2024-07-09: qty 10

## 2024-07-09 MED ORDER — PROPOFOL 10 MG/ML IV BOLUS
INTRAVENOUS | Status: DC | PRN
Start: 2024-07-09 — End: 2024-07-09
  Administered 2024-07-09: 100 mg via INTRAVENOUS

## 2024-07-09 SURGICAL SUPPLY — 21 items
BALLOON OCL BRIDGE 80X90X.9 60 (BALLOONS) IMPLANT
BLANKET WARM UNDERBOD FULL ACC (MISCELLANEOUS) IMPLANT
CABLE SURGICAL S-101-97-12 (CABLE) ×1 IMPLANT
CATH RIGHTSITE C315HIS02 (CATHETERS) IMPLANT
CLOSURE VENOUSE MYNX (Vascular Products) IMPLANT
DEVICE CLOSURE MYNXGRIP 6/7F (Vascular Products) IMPLANT
IPG PACE AZUR XT DR MRI W1DR01 (Pacemaker) IMPLANT
KIT BRIDGE PREP (KITS) IMPLANT
KIT MICROPUNCTURE NIT STIFF (SHEATH) IMPLANT
KIT STYLET 52CM (MISCELLANEOUS) IMPLANT
KIT WRENCH (KITS) IMPLANT
LEAD SELECT SECURE 3830 383069 (Lead) IMPLANT
PAD DEFIB RADIO PHYSIO CONN (PAD) ×1 IMPLANT
POUCH AIGIS-R ANTIBACT PPM MED (Mesh General) IMPLANT
SHEATH 7FR PRELUDE SNAP 25 (SHEATH) IMPLANT
SHEATH LASER 14 FR GLIDELIGHT (SHEATH) IMPLANT
SHEATH PINNACLE 7F 10CM (SHEATH) IMPLANT
SLITTER 6232ADJ (MISCELLANEOUS) IMPLANT
TRAY PACEMAKER INSERTION (PACKS) ×1 IMPLANT
WIRE HI TORQ VERSACORE-J 145CM (WIRE) IMPLANT
WIRE MICRO SET SILHO 5FR 7 (SHEATH) IMPLANT

## 2024-07-09 NOTE — Discharge Instructions (Signed)
 After Your Lead Revision  Do not lift your arm above shoulder height for 1 week after your procedure. After 7 days, you may progress as below.  You should remove your sling 24 hours after your procedure, unless otherwise instructed by your provider.     Friday July 16, 2024  Saturday July 17, 2024 Sunday July 18, 2024 Monday July 19, 2024   Do not lift, push, pull, or carry anything over 10 pounds with the affected arm until 6 weeks (Friday August 20, 2024) after your procedure.   Do not drive until your wound check or until instructed by your healthcare provider that you are safe to do so.   Monitor your surgical site for redness, swelling, and drainage. Call the device clinic at [phone] if you experience these symptoms or fever/chills.  If your incision is sealed with Steri-strips or staples. You may shower 7 days after your procedure and wash your incision with soap and water  as long as it is healed. If your incision is closed with Dermabond/Surgical glue. You may shower 1 day after your pacemaker implant and wash your incision with soap and water . Avoid lotions, ointments, or perfumes over your incision until it is well-healed.  You may use a hot tub or a pool AFTER your wound check appointment if the incision is completely closed.   Your cardiac device may be MRI compatible. We will discuss this at your office visit/Wound check  Remote monitoring is used to monitor your cardiac device from home. This monitoring is scheduled every 91 days by our office. It allows us  to keep an eye on the functioning of your device to ensure it is working properly. You will routinely see your Electrophysiologist annually (more often if necessary).   Implantable Cardiac Device Lead Replacement, Care After This sheet gives you information about how to care for yourself after your procedure. Your health care provider may also give you more specific instructions. If you have problems or  questions, contact your health care provider. What can I expect after the procedure? After your procedure, it is common to have: Mild discomfort at the incision site. A small amount of drainage or bleeding at the incision site. This is usually no more than a spot. Follow these instructions at home: Incision care        Follow instructions from your health care provider about how to take care of your incision. Make sure you: Leave stitches (sutures), skin glue, or adhesive strips in place. These skin closures may need to stay in place for 2 weeks or longer. If adhesive strip edges start to loosen and curl up, you may trim the loose edges. Do not remove adhesive strips completely unless your health care provider tells you to do that. Check your incision area every day for signs of infection. Check for: More redness, swelling, or pain. More fluid or blood. Warmth. Pus or a bad smell. Electric and Engineer, building services cell phones should be kept 12 inches (30 cm) away from the cardiac device when they are on. When talking on a cell phone, use the ear on the opposite side of your cardiac device. Do not place a cell phone in a pocket next to the cardiac device. Household appliances do not interfere with modern-day cardiac device. Medicines Take over-the-counter and prescription medicines only as told by your health care provider. General instructions Do not raise the arm on the side of your procedure higher than your shoulder for at least 7 days. Except  for this restriction, continue to use your arm as normal to prevent problems. Do not take baths, swim, or use a hot tub until your health care provider says it is okay to do so. You may shower as directed by your health care provider. Do not lift anything that is heavier than 10 lb (4.5 kg) for 6 weeks or the limit that your health care provider tells you, until he or she says that it is safe. Return to your normal activities after 2 weeks, or  as told by your health care provider. Ask your health care provider what activities are safe for you. Keep all follow-up visits as told by your health care provider. This is important. Contact a health care provider if: You have more redness, swelling, or pain around your incision. You have more fluid or blood coming from your incision. Your incision feels warm to the touch. You have pus or a bad smell coming from your incision. You have a fever. The arm or hand on the side of the cardiac device becomes swollen. The symptoms you had before your procedure are not getting better. Get help right away if: You develop chest pain. You feel like you will faint. You feel light-headed. You faint. Summary Check your incision area every day for signs of infection, such as more fluid or blood. A small amount of drainage or bleeding at the incision site is normal. Do not raise the arm on the side of your procedure higher than your shoulder for at least 5 days, or as long as directed by your health care provider. Digital cell phones should be kept 12 inches (30 cm) away from the cardiac device when they are on. When talking on a cell phone, use the ear on the opposite side of your cardiac device. If the symptoms that led to having your lead replaced are not getting better, contact your health care provider. This information is not intended to replace advice given to you by your health care provider. Make sure you discuss any questions you have with your health care provider.

## 2024-07-09 NOTE — Progress Notes (Signed)
 Spoke with pharmacy concerning the gentamycin irrigation fluid related to the patient's listed allergy.  Due to the manner it is absorbed and the concentration, the gentamiycin irrigant can be used.

## 2024-07-09 NOTE — Anesthesia Postprocedure Evaluation (Signed)
 Anesthesia Post Note  Patient: Kyle Bowen  Procedure(s) Performed: LEAD EXTRACTION LEAD INSERTION PPM GENERATOR CHANGEOUT     Patient location during evaluation: PACU Anesthesia Type: General Level of consciousness: awake and alert Pain management: pain level controlled Vital Signs Assessment: post-procedure vital signs reviewed and stable Respiratory status: spontaneous breathing, nonlabored ventilation and respiratory function stable Cardiovascular status: stable and blood pressure returned to baseline Anesthetic complications: no   There were no known notable events for this encounter.  Last Vitals:  Vitals:   07/09/24 2015 07/09/24 2030  BP: (!) 143/96 130/87  Pulse: 64 62  Resp: 10 12  Temp: 36.7 C 36.6 C  SpO2: 95% 97%    Last Pain:  Vitals:   07/09/24 2030  TempSrc: Oral  PainSc: 2                  Debby FORBES Like

## 2024-07-09 NOTE — Transfer of Care (Signed)
 Immediate Anesthesia Transfer of Care Note  Patient: Kyle Bowen  Procedure(s) Performed: LEAD EXTRACTION LEAD INSERTION PPM GENERATOR CHANGEOUT  Patient Location: PACU  Anesthesia Type:General  Level of Consciousness: awake, alert , and oriented  Airway & Oxygen Therapy: Patient Spontanous Breathing and Patient connected to face mask oxygen  Post-op Assessment: Report given to RN and Post -op Vital signs reviewed and stable  Post vital signs: Reviewed and stable  Last Vitals:  Vitals Value Taken Time  BP 150/87 07/09/24 18:39  Temp    Pulse 64 07/09/24 18:43  Resp 13 07/09/24 18:43  SpO2 97 % 07/09/24 18:43  Vitals shown include unfiled device data.  Last Pain:  Vitals:   07/09/24 1338  TempSrc:   PainSc: 0-No pain         Complications: There were no known notable events for this encounter.

## 2024-07-09 NOTE — Anesthesia Procedure Notes (Signed)
 Procedure Name: Intubation Date/Time: 07/09/2024 3:44 PM  Performed by: Delores Dus, CRNAPre-anesthesia Checklist: Patient identified, Emergency Drugs available, Suction available and Patient being monitored Patient Re-evaluated:Patient Re-evaluated prior to induction Oxygen Delivery Method: Circle system utilized Preoxygenation: Pre-oxygenation with 100% oxygen Induction Type: IV induction Ventilation: Mask ventilation without difficulty Laryngoscope Size: Miller and 2 Grade View: Grade I Tube type: Oral Tube size: 7.5 mm Number of attempts: 1 Airway Equipment and Method: Stylet and Oral airway Placement Confirmation: ETT inserted through vocal cords under direct vision, positive ETCO2 and breath sounds checked- equal and bilateral Secured at: 22 cm Tube secured with: Tape Dental Injury: Teeth and Oropharynx as per pre-operative assessment

## 2024-07-09 NOTE — Interval H&P Note (Signed)
 History and Physical Interval Note:  07/09/2024 2:59 PM  Kyle Bowen  has presented today for surgery, with the diagnosis of RV pacing lead malfunction.  The various methods of treatment have been discussed with the patient and family. After consideration of risks, benefits and other options for treatment, the patient has consented to  Procedure(s): LEAD EXTRACTION (N/A) LEAD INSERTION (N/A) PPM GENERATOR CHANGEOUT (N/A) as a surgical intervention.  The patient's history has been reviewed, patient examined, no change in status, stable for surgery.  I have reviewed the patient's chart and labs.  Questions were answered to the patient's satisfaction.     Fonda Kitty

## 2024-07-10 ENCOUNTER — Ambulatory Visit (HOSPITAL_COMMUNITY)

## 2024-07-10 DIAGNOSIS — Z96612 Presence of left artificial shoulder joint: Secondary | ICD-10-CM | POA: Diagnosis not present

## 2024-07-10 DIAGNOSIS — I10 Essential (primary) hypertension: Secondary | ICD-10-CM | POA: Diagnosis not present

## 2024-07-10 DIAGNOSIS — T82110A Breakdown (mechanical) of cardiac electrode, initial encounter: Secondary | ICD-10-CM | POA: Diagnosis not present

## 2024-07-10 DIAGNOSIS — Z96611 Presence of right artificial shoulder joint: Secondary | ICD-10-CM | POA: Diagnosis not present

## 2024-07-10 DIAGNOSIS — K219 Gastro-esophageal reflux disease without esophagitis: Secondary | ICD-10-CM | POA: Diagnosis not present

## 2024-07-10 DIAGNOSIS — T82110D Breakdown (mechanical) of cardiac electrode, subsequent encounter: Secondary | ICD-10-CM

## 2024-07-10 DIAGNOSIS — I442 Atrioventricular block, complete: Secondary | ICD-10-CM | POA: Diagnosis not present

## 2024-07-10 DIAGNOSIS — J90 Pleural effusion, not elsewhere classified: Secondary | ICD-10-CM | POA: Diagnosis not present

## 2024-07-10 DIAGNOSIS — G7000 Myasthenia gravis without (acute) exacerbation: Secondary | ICD-10-CM | POA: Diagnosis not present

## 2024-07-10 DIAGNOSIS — I700 Atherosclerosis of aorta: Secondary | ICD-10-CM | POA: Diagnosis not present

## 2024-07-10 DIAGNOSIS — I088 Other rheumatic multiple valve diseases: Secondary | ICD-10-CM | POA: Diagnosis not present

## 2024-07-10 MED ORDER — AMIODARONE HCL IN DEXTROSE 360-4.14 MG/200ML-% IV SOLN: 30.0000 mg/h | INTRAVENOUS | Status: DC

## 2024-07-10 MED ORDER — AMIODARONE HCL IN DEXTROSE 360-4.14 MG/200ML-% IV SOLN
60.0000 mg/h | INTRAVENOUS | Status: AC
  Administered 2024-07-10 (×2): 60 mg/h via INTRAVENOUS
  Filled 2024-07-10 (×2): qty 200

## 2024-07-10 MED ORDER — AMIODARONE LOAD VIA INFUSION
150.0000 mg | Freq: Once | INTRAVENOUS | Status: AC
  Administered 2024-07-10: 150 mg via INTRAVENOUS
  Filled 2024-07-10: qty 83.34

## 2024-07-10 MED ORDER — MYCOPHENOLATE MOFETIL 250 MG PO CAPS
500.0000 mg | ORAL_CAPSULE | Freq: Two times a day (BID) | ORAL | Status: DC
  Administered 2024-07-10: 500 mg via ORAL
  Filled 2024-07-10 (×2): qty 2

## 2024-07-10 MED ORDER — LOSARTAN POTASSIUM 25 MG PO TABS
25.0000 mg | ORAL_TABLET | Freq: Every day | ORAL | Status: DC
  Administered 2024-07-10: 25 mg via ORAL
  Filled 2024-07-10: qty 1

## 2024-07-10 MED ORDER — PANTOPRAZOLE SODIUM 40 MG PO TBEC
40.0000 mg | DELAYED_RELEASE_TABLET | Freq: Every day | ORAL | Status: DC
  Administered 2024-07-10: 40 mg via ORAL
  Filled 2024-07-10: qty 1

## 2024-07-10 MED ORDER — ONDANSETRON HCL 4 MG/2ML IJ SOLN: 4.0000 mg | Freq: Four times a day (QID) | INTRAMUSCULAR | Status: DC | PRN

## 2024-07-10 MED ORDER — ACETAMINOPHEN 325 MG PO TABS: 325.0000 mg | ORAL_TABLET | ORAL | Status: DC | PRN

## 2024-07-10 NOTE — Progress Notes (Signed)
  Echocardiogram Echocardiogram Transesophageal has been performed.  Kyle Bowen 07/10/2024, 8:49 AM

## 2024-07-10 NOTE — Progress Notes (Signed)
 Progress Note  Patient Name: Kyle Bowen Date of Encounter: 07/10/2024  Primary Cardiologist: Elspeth Sage, MD   Subjective   No chest pain or sob.   Inpatient Medications    Scheduled Meds:  sodium chloride    Intravenous Once   losartan   25 mg Oral Daily   mycophenolate   500 mg Oral BID   pantoprazole   40 mg Oral Daily   Continuous Infusions:  amiodarone  60 mg/hr (07/10/24 0829)   Followed by   amiodarone      PRN Meds: acetaminophen , ondansetron  (ZOFRAN ) IV   Vital Signs    Vitals:   07/09/24 2030 07/10/24 0012 07/10/24 0415 07/10/24 0733  BP: 130/87 122/83 129/83 126/75  Pulse: 62  73 61  Resp: 12 13 12 18   Temp: 97.8 F (36.6 C) 98.1 F (36.7 C) 98.1 F (36.7 C) 98 F (36.7 C)  TempSrc: Oral Oral Oral Oral  SpO2: 97%  94% 96%  Weight:   80.6 kg   Height:        Intake/Output Summary (Last 24 hours) at 07/10/2024 1134 Last data filed at 07/10/2024 0421 Gross per 24 hour  Intake 1050 ml  Output 1050 ml  Net 0 ml   Filed Weights   07/09/24 1319 07/09/24 2015 07/10/24 0415  Weight: 77.1 kg 80.8 kg 80.6 kg    Telemetry    nsr - Personally Reviewed  ECG    Nsr with IRBBB - Personally Reviewed  Physical Exam   GEN: No acute distress.   Neck: No JVD Cardiac: RRR, no murmurs, rubs, or gallops.  Respiratory: Clear to auscultation bilaterally. GI: Soft, nontender, non-distended  MS: No edema; No deformity. Neuro:  Nonfocal  Psych: Normal affect   Labs    ChemistryNo results for input(s): NA, K, CL, CO2, GLUCOSE, BUN, CREATININE, CALCIUM , PROT, ALBUMIN , AST, ALT, ALKPHOS, BILITOT, GFRNONAA, GFRAA, ANIONGAP in the last 168 hours.   HematologyNo results for input(s): WBC, RBC, HGB, HCT, MCV, MCH, MCHC, RDW, PLT in the last 168 hours.  Cardiac EnzymesNo results for input(s): TROPONINI in the last 168 hours. No results for input(s): TROPIPOC in the last 168 hours.   BNPNo results for  input(s): BNP, PROBNP in the last 168 hours.   DDimer No results for input(s): DDIMER in the last 168 hours.   Radiology    DG Chest 2 View Result Date: 07/10/2024 CLINICAL DATA:  [id number].  Cardiac device in-situ. EXAM: CHEST - 2 VIEW COMPARISON:  Portable chest 02/11/2023 FINDINGS: AP Lat study at 6:19 a.m. There is a left chest dual lead pacing system in place. One of the wires terminates in the right atrium and the other in the plane of the right ventricle. There is no measurable pneumothorax. There is a small left pleural effusion. Coarse atelectatic markings newly noted in the left lower lung field. The remaining lungs are clear. There is slight cardiomegaly but no findings of CHF. The aorta is tortuous, with atherosclerosis. The mediastinum is stable. No new osseous findings. There is thoracic dextroscoliosis and bilateral shoulder replacements. Osteopenia. IMPRESSION: 1. Left chest dual lead pacing system in place. No measurable pneumothorax. 2. Small left pleural effusion. 3. Coarse atelectatic markings newly noted in the left lower lung field. 4. Aortic atherosclerosis. Electronically Signed   By: Francis Quam M.D.   On: 07/10/2024 06:59   EP PPM/ICD IMPLANT Result Date: 07/09/2024  CONCLUSIONS:  1. Successful extraction of old malfunctioning 3830 His bundle lead.  2. Successful implant of new 3830 lead in  the left bundle position.  3. Successful pacemaker generator change.  4. No early apparent complications. Fonda Kitty, MD, [facility], [facility] Cardiac Electrophysiology    Cardiac Studies   See above. His PPM interrogation demonstrates normal DDD PM function.  Patient Profile     78 y.o. male admitted wit a failing RV lead s/p extraction and insertion of a new RV lead.   Assessment & Plan    PPM lead failure - he is s/p extraction and insertion of a new lead with the new lead evaluated under my direction working normally. He will be discharged home with usual followup.  For  questions or updates, please contact [facility] Please consult [email or web address] for contact info under Cardiology/STEMI.      Signed, Danelle Birmingham, MD  07/10/2024, 11:34 AM

## 2024-07-10 NOTE — Plan of Care (Signed)
  Problem: Education: Goal: Knowledge of cardiac device and self-care will improve Outcome: Progressing   Problem: Cardiac: Goal: Ability to achieve and maintain adequate cardiopulmonary perfusion will improve Outcome: Progressing   Problem: Education: Goal: Knowledge of General Education information will improve Description: Including pain rating scale, medication(s)/side effects and non-pharmacologic comfort measures Outcome: Progressing   Problem: Clinical Measurements: Goal: Respiratory complications will improve Outcome: Progressing   Problem: Clinical Measurements: Goal: Cardiovascular complication will be avoided Outcome: Progressing

## 2024-07-10 NOTE — Plan of Care (Signed)
  Problem: Education: Goal: Knowledge of cardiac device and self-care will improve Outcome: Progressing

## 2024-07-10 NOTE — Discharge Summary (Signed)
 Discharge Summary   Patient ID: Kyle Bowen MRN: [id number]; DOB: 07/23/1946  Admit date: 07/09/2024 Discharge date: 07/10/2024  PCP:  Street, Lonni HERO, MD   [facility] Providers Cardiologist:  Elspeth Sage, MD  Electrophysiologist:  Fonda Kitty, MD    Discharge Diagnoses  Principal Problem:   Pacemaker lead malfunction   Diagnostic Studies/Procedures  Lead extraction and generator change 07/09/2024 CONCLUSIONS:   1. Successful extraction of old malfunctioning 3830 His bundle lead.  2. Successful implant of new 3830 lead in the left bundle position.   3. Successful pacemaker generator change.   4. No early apparent complications.    _____________   History of Present Illness   Kyle Bowen is a 78 y.o. male with h/o intermittent CHB s/p dual chamber pacemaker, sinus node dysfunction, myasthenia gravis.  He has history of intermittent CHB status post dual-chamber PPM with lead placement in 2019, not MRI compatible.  Most recently has been having issues with RV lead noise and there is some fibrosis developing.  Given intermittent dependence of his RV lead he was admitted for extraction and reimplant.  Hospital Course   Consultants:    PPM lead failure Status post extraction and insertion of new lead.  Has stable device function based off of interrogation here. 1. Successful extraction of old malfunctioning 3830 His bundle lead.  2. Successful implant of new 3830 lead in the left bundle position.   3. Successful pacemaker generator change.   4. No early apparent complications.    Myasthenia gravis. Continue HOME meds  Patient seen and examined by Dr. Waddell stable for discharge.  No apparent complications after procedure.  Follow-up has been arranged.  No changes in medications.    Did the patient have an acute coronary syndrome (MI, NSTEMI, STEMI, etc) this admission?:  No                               Did the patient have a percutaneous  coronary intervention (stent / angioplasty)?:  No.          _____________  Discharge Vitals Blood pressure 126/75, pulse 61, temperature 98 F (36.7 C), temperature source Oral, resp. rate 18, height 5' 7 (1.702 m), weight 80.6 kg, SpO2 96%.  Filed Weights   07/09/24 1319 07/09/24 2015 07/10/24 0415  Weight: 77.1 kg 80.8 kg 80.6 kg    Labs & Radiologic Studies  CBC No results for input(s): WBC, NEUTROABS, HGB, HCT, MCV, PLT in the last 72 hours. Basic Metabolic Panel No results for input(s): NA, K, CL, CO2, GLUCOSE, BUN, CREATININE, CALCIUM , MG, PHOS in the last 72 hours. Liver Function Tests No results for input(s): AST, ALT, ALKPHOS, BILITOT, PROT, ALBUMIN  in the last 72 hours. No results for input(s): LIPASE, AMYLASE in the last 72 hours. High Sensitivity Troponin:   No results for input(s): TROPONINIHS in the last 720 hours.  No results for input(s): TRNPT in the last 720 hours.  BNP Invalid input(s): POCBNP No results for input(s): PROBNP in the last 72 hours.  No results for input(s): BNP in the last 72 hours.  D-Dimer No results for input(s): DDIMER in the last 72 hours. Hemoglobin A1C No results for input(s): HGBA1C in the last 72 hours. Fasting Lipid Panel No results for input(s): CHOL, HDL, LDLCALC, TRIG, CHOLHDL, LDLDIRECT in the last 72 hours. No results found for: LIPOA  Thyroid  Function Tests No results for input(s): TSH, T4TOTAL, T3FREE,  THYROIDAB in the last 72 hours.  Invalid input(s): FREET3 _____________  DG Chest 2 View Result Date: 07/10/2024 CLINICAL DATA:  [id number].  Cardiac device in-situ. EXAM: CHEST - 2 VIEW COMPARISON:  Portable chest 02/11/2023 FINDINGS: AP Lat study at 6:19 a.m. There is a left chest dual lead pacing system in place. One of the wires terminates in the right atrium and the other in the plane of the right ventricle. There is no measurable  pneumothorax. There is a small left pleural effusion. Coarse atelectatic markings newly noted in the left lower lung field. The remaining lungs are clear. There is slight cardiomegaly but no findings of CHF. The aorta is tortuous, with atherosclerosis. The mediastinum is stable. No new osseous findings. There is thoracic dextroscoliosis and bilateral shoulder replacements. Osteopenia. IMPRESSION: 1. Left chest dual lead pacing system in place. No measurable pneumothorax. 2. Small left pleural effusion. 3. Coarse atelectatic markings newly noted in the left lower lung field. 4. Aortic atherosclerosis. Electronically Signed   By: Francis Quam M.D.   On: 07/10/2024 06:59   EP PPM/ICD IMPLANT Result Date: 07/09/2024  CONCLUSIONS:  1. Successful extraction of old malfunctioning 3830 His bundle lead.  2. Successful implant of new 3830 lead in the left bundle position.  3. Successful pacemaker generator change.  4. No early apparent complications. Fonda Kitty, MD, [facility], [facility] Cardiac Electrophysiology    Disposition Pt is being discharged home today in good condition.  Follow-up Plans & Appointments  Follow-up Information     Lesia Ozell Barter, PA-C Follow up.   Specialty: Cardiology Why: 07/23/2024 at 8:00 Contact information: [address] [place] [address] [phone]                  Discharge Medications Allergies as of 07/10/2024       Reactions   Aluminum-containing Compounds Other (See Comments)   Myasthenic patient    Aminoglycosides Other (See Comments)   Myasthenic patient    Azithromycin Other (See Comments)   Myasthenic patient    Botulinum Toxins Other (See Comments)   Myasthenic patient    Calcium  Channel Blockers Other (See Comments)   Myasthenic patient    Ciprofloxacin Other (See Comments)   Myasthenic patient    Colistin Other (See Comments)   Myasthenic patient    D Tubocurarine [tubocurarine] Other (See Comments)   Myasthenic patient     Erythromycin Base Other (See Comments)   Myasthenic patient    Gentamicin  Other (See Comments)   Myasthenic patient    Iodinated Contrast Media Other (See Comments)   Myasthenic patient    Kanamycin Other (See Comments)   Myasthenic patient    Macrolides And Ketolides Other (See Comments)   Myasthenic patient    Magnesium-containing Compounds Other (See Comments)   Myasthenic patient    Moxifloxacin Other (See Comments)   Myasthenic patient    Neomycin Other (See Comments)   Myasthenic patient    Norfloxacin Other (See Comments)   Myasthenic patient    Ofloxacin Other (See Comments)   Myasthenic patient    Pefloxacin Other (See Comments)   Myasthenic patient    Penicillamine Other (See Comments)   Myasthenic patient    Procainamide Other (See Comments)   Myasthenic patient    Propranolol Other (See Comments)   Myasthenic patient    Quinidine Other (See Comments)   Myasthenic patient    Quinine Derivatives Other (See Comments)   Myasthenic patient    Simethicone Other (See Comments)   Myasthenic  patient    Streptomycin Other (See Comments)   Myasthenic patient    Succinylcholine Other (See Comments)   Myasthenic patient    Telithromycin Other (See Comments)   Myasthenic patient    Timolol Other (See Comments)   Myasthenic patient    Tobramycin Other (See Comments)   Myasthenic patient    Vecuronium Other (See Comments)   Myasthenic patient         Medication List     TAKE these medications    losartan  25 MG tablet Commonly known as: COZAAR  Take 25 mg by mouth daily.   mycophenolate  500 MG tablet Commonly known as: CELLCEPT  Take 500 mg by mouth 2 (two) times daily.   pantoprazole  40 MG tablet Commonly known as: PROTONIX  Take 40 mg by mouth daily.   pyridostigmine  60 MG tablet Commonly known as: MESTINON  Take 60 mg by mouth daily as needed (difficulty with tongue).   simvastatin  20 MG tablet Commonly known as: ZOCOR  Take 20 mg by mouth at  bedtime. What changed: Another medication with the same name was removed. Continue taking this medication, and follow the directions you see here.         Outstanding Labs/Studies   Duration of Discharge Encounter: APP Time: 20 minutes   Signed, Thom LITTIE Sluder, PA-C 07/10/2024, 12:26 PM

## 2024-07-11 ENCOUNTER — Encounter (HOSPITAL_COMMUNITY): Payer: Self-pay | Admitting: Cardiology

## 2024-07-12 ENCOUNTER — Encounter: Payer: Self-pay | Admitting: Emergency Medicine

## 2024-07-12 NOTE — Addendum Note (Signed)
 Addended by: VICCI SELLER A on: 07/12/2024 02:42 PM   Modules accepted: Orders

## 2024-07-12 NOTE — Progress Notes (Signed)
 Remote pacemaker transmission.

## 2024-07-13 LAB — BPAM RBC
Blood Product Expiration Date: 202509042359
Blood Product Expiration Date: 202509052359
Blood Product Expiration Date: 202509052359
Blood Product Expiration Date: 202509052359
ISSUE DATE / TIME: 202508052203
ISSUE DATE / TIME: 202508081526
ISSUE DATE / TIME: 202508081526
Unit Type and Rh: 5100
Unit Type and Rh: 5100
Unit Type and Rh: 5100
Unit Type and Rh: 5100

## 2024-07-13 LAB — TYPE AND SCREEN
ABO/RH(D): O POS
Antibody Screen: NEGATIVE
Unit division: 0
Unit division: 0
Unit division: 0
Unit division: 0

## 2024-07-14 ENCOUNTER — Telehealth: Payer: Self-pay

## 2024-07-14 NOTE — Telephone Encounter (Signed)
 Follow-up after same day discharge: Implant date: 07/09/2024 MD: Fonda Kitty, MD Device: [employer]onic [id number] Azure XT DR MRI Location: Left Chest   Wound check visit: 07/23/2024 90 day MD follow-up: 10/06/2024 w/ Charlies Arthur, PA  Remote Transmission received:07/11/2024  Dressing/sling removed: Yes  Confirm OAC restart on: N/A  Please continue to monitor your cardiac device site for redness, swelling, and drainage. Call the device clinic at [phone] if you experience these symptoms, fever/chills, or have questions about your device.   Remote monitoring is used to monitor your cardiac device from home. This monitoring is scheduled every 91 days by our office. It allows us  to keep an eye on the functioning of your device to ensure it is working properly.

## 2024-07-15 ENCOUNTER — Ambulatory Visit: Payer: Self-pay | Admitting: Cardiovascular Disease

## 2024-07-22 LAB — ECHO TEE
AR max vel: 2.77 cm2
AV Area VTI: 2.98 cm2
AV Area mean vel: 2.97 cm2
AV Mean grad: 2 mmHg
AV Peak grad: 6.4 mmHg
Ao pk vel: 1.26 m/s

## 2024-07-23 ENCOUNTER — Ambulatory Visit: Attending: Student | Admitting: Student

## 2024-07-23 ENCOUNTER — Encounter: Payer: Self-pay | Admitting: Student

## 2024-07-23 DIAGNOSIS — I442 Atrioventricular block, complete: Secondary | ICD-10-CM | POA: Insufficient documentation

## 2024-07-23 LAB — CUP PACEART INCLINIC DEVICE CHECK
Battery Remaining Longevity: 176 mo
Battery Voltage: 3.22 V
Brady Statistic AP VP Percent: 0.07 %
Brady Statistic AP VS Percent: 31.49 %
Brady Statistic AS VP Percent: 1.32 %
Brady Statistic AS VS Percent: 67.12 %
Brady Statistic RA Percent Paced: 31.78 %
Brady Statistic RV Percent Paced: 1.39 %
Date Time Interrogation Session: 20250822083006
Implantable Lead Connection Status: 753985
Implantable Lead Connection Status: 753985
Implantable Lead Implant Date: 20190904
Implantable Lead Implant Date: 20250808
Implantable Lead Location: 753859
Implantable Lead Location: 753860
Implantable Lead Model: 3830
Implantable Lead Model: 5076
Implantable Pulse Generator Implant Date: 20250808
Lead Channel Impedance Value: 323 Ohm
Lead Channel Impedance Value: 399 Ohm
Lead Channel Impedance Value: 399 Ohm
Lead Channel Impedance Value: 703 Ohm
Lead Channel Pacing Threshold Amplitude: 0.25 V
Lead Channel Pacing Threshold Amplitude: 0.875 V
Lead Channel Pacing Threshold Pulse Width: 0.4 ms
Lead Channel Pacing Threshold Pulse Width: 0.4 ms
Lead Channel Sensing Intrinsic Amplitude: 10.625 mV
Lead Channel Sensing Intrinsic Amplitude: 2.25 mV
Lead Channel Sensing Intrinsic Amplitude: 3.25 mV
Lead Channel Sensing Intrinsic Amplitude: 8 mV
Lead Channel Setting Pacing Amplitude: 1.5 V
Lead Channel Setting Pacing Amplitude: 3.5 V
Lead Channel Setting Pacing Pulse Width: 0.4 ms
Lead Channel Setting Sensing Sensitivity: 0.9 mV
Zone Setting Status: 755011

## 2024-07-23 NOTE — Progress Notes (Signed)
 Normal dual chamber pacemaker wound check. Presenting rhythm: AS/VS . Wound well healed. Routine testing performed. Thresholds, sensing, and impedances consistent with implant measurements and at 3.5V safety margin/auto capture until 3 month visit. No episodes. Reviewed arm restrictions to continue for 6 weeks total post op.  Pt enrolled in remote follow-up.

## 2024-07-23 NOTE — Patient Instructions (Signed)
 Medication Instructions:  Your physician recommends that you continue on your current medications as directed. Please refer to the Current Medication list given to you today.  *If you need a refill on your cardiac medications before your next appointment, please call your pharmacy*  Lab Work: None ordered If you have labs (blood work) drawn today and your tests are completely normal, you will receive your results only by: MyChart Message (if you have MyChart) OR A paper copy in the mail If you have any lab test that is abnormal or we need to change your treatment, we will call you to review the results.  Follow-Up: At [facility], you and your health needs are our priority.  As part of our continuing mission to provide you with exceptional heart care, our providers are all part of one team.  This team includes your primary Cardiologist (physician) and Advanced Practice Providers or APPs (Physician Assistants and Nurse Practitioners) who all work together to provide you with the care you need, when you need it.  Your next appointment:   As scheduled

## 2024-07-26 ENCOUNTER — Ambulatory Visit: Payer: Self-pay | Admitting: Cardiology

## 2024-08-12 ENCOUNTER — Encounter

## 2024-08-20 ENCOUNTER — Encounter

## 2024-08-20 ENCOUNTER — Ambulatory Visit (INDEPENDENT_AMBULATORY_CARE_PROVIDER_SITE_OTHER)

## 2024-08-20 DIAGNOSIS — I442 Atrioventricular block, complete: Secondary | ICD-10-CM

## 2024-08-20 LAB — CUP PACEART REMOTE DEVICE CHECK
Battery Remaining Longevity: 177 mo
Battery Voltage: 3.22 V
Brady Statistic AP VP Percent: 0.06 %
Brady Statistic AP VS Percent: 21.22 %
Brady Statistic AS VP Percent: 3.39 %
Brady Statistic AS VS Percent: 75.32 %
Brady Statistic RA Percent Paced: 21.64 %
Brady Statistic RV Percent Paced: 3.46 %
Date Time Interrogation Session: 20250918215601
Implantable Lead Connection Status: 753985
Implantable Lead Connection Status: 753985
Implantable Lead Implant Date: 20190904
Implantable Lead Implant Date: 20250808
Implantable Lead Location: 753859
Implantable Lead Location: 753860
Implantable Lead Model: 3830
Implantable Lead Model: 5076
Implantable Pulse Generator Implant Date: 20250808
Lead Channel Impedance Value: 323 Ohm
Lead Channel Impedance Value: 380 Ohm
Lead Channel Impedance Value: 399 Ohm
Lead Channel Impedance Value: 684 Ohm
Lead Channel Pacing Threshold Amplitude: 0.25 V
Lead Channel Pacing Threshold Amplitude: 1 V
Lead Channel Pacing Threshold Pulse Width: 0.4 ms
Lead Channel Pacing Threshold Pulse Width: 0.4 ms
Lead Channel Sensing Intrinsic Amplitude: 10.125 mV
Lead Channel Sensing Intrinsic Amplitude: 10.125 mV
Lead Channel Sensing Intrinsic Amplitude: 4.5 mV
Lead Channel Sensing Intrinsic Amplitude: 4.5 mV
Lead Channel Setting Pacing Amplitude: 1.5 V
Lead Channel Setting Pacing Amplitude: 2 V
Lead Channel Setting Pacing Pulse Width: 0.4 ms
Lead Channel Setting Sensing Sensitivity: 0.9 mV
Zone Setting Status: 755011

## 2024-08-22 ENCOUNTER — Ambulatory Visit: Payer: Self-pay | Admitting: Cardiology

## 2024-08-24 NOTE — Progress Notes (Signed)
 Remote PPM Transmission

## 2024-09-03 DIAGNOSIS — L578 Other skin changes due to chronic exposure to nonionizing radiation: Secondary | ICD-10-CM | POA: Diagnosis not present

## 2024-09-03 DIAGNOSIS — C44319 Basal cell carcinoma of skin of other parts of face: Secondary | ICD-10-CM | POA: Diagnosis not present

## 2024-09-03 DIAGNOSIS — Z85820 Personal history of malignant melanoma of skin: Secondary | ICD-10-CM | POA: Diagnosis not present

## 2024-09-03 DIAGNOSIS — L814 Other melanin hyperpigmentation: Secondary | ICD-10-CM | POA: Diagnosis not present

## 2024-09-03 DIAGNOSIS — D485 Neoplasm of uncertain behavior of skin: Secondary | ICD-10-CM | POA: Diagnosis not present

## 2024-09-03 DIAGNOSIS — L570 Actinic keratosis: Secondary | ICD-10-CM | POA: Diagnosis not present

## 2024-09-13 DIAGNOSIS — R300 Dysuria: Secondary | ICD-10-CM | POA: Diagnosis not present

## 2024-09-13 DIAGNOSIS — Z23 Encounter for immunization: Secondary | ICD-10-CM | POA: Diagnosis not present

## 2024-09-13 DIAGNOSIS — Z6826 Body mass index (BMI) 26.0-26.9, adult: Secondary | ICD-10-CM | POA: Diagnosis not present

## 2024-09-21 DIAGNOSIS — C44722 Squamous cell carcinoma of skin of right lower limb, including hip: Secondary | ICD-10-CM | POA: Diagnosis not present

## 2024-10-01 DIAGNOSIS — L02415 Cutaneous abscess of right lower limb: Secondary | ICD-10-CM | POA: Diagnosis not present

## 2024-10-06 ENCOUNTER — Ambulatory Visit: Admitting: Physician Assistant

## 2024-10-06 NOTE — Progress Notes (Unsigned)
  Electrophysiology Office Note:   ID:  Kyle Bowen, Kyle Bowen 1946/05/31, MRN [id number]  Primary Cardiologist: Elspeth Sage, MD (Inactive) Electrophysiologist: Fonda Kitty, MD  {Click to update primary MD,subspecialty MD or APP then REFRESH:1}    History of Present Illness:   Kyle Bowen is a 78 y.o. male with h/o intermittent CHB s/p PPM, SND, Myasthenia gravis, and PPM lead malfunction seen today for routine electrophysiology follow-up s/p Pacemaker implant.  Pt underwent lead extraction and new lead/gen change 07/09/2024 due to RV lead noise.  Since last being seen in our clinic the patient reports doing ***.  he denies chest pain, palpitations, dyspnea, PND, orthopnea, nausea, vomiting, dizziness, syncope, edema, weight gain, or early satiety.    Review of systems complete and found to be negative unless listed in HPI.   EP Information / Studies Reviewed:    EKG is not ordered today. EKG from 8/9 reviewed which showed NSRat 68 bpm       PPM Interrogation-  reviewed in detail today,  See PACEART report.  Arrhythmia/Device History MDT PPM -  BUNDLE OF HIS   Physical Exam:   VS:  There were no vitals taken for this visit.   Wt Readings from Last 3 Encounters:  07/10/24 177 lb 11.1 oz (80.6 kg)  06/10/24 170 lb (77.1 kg)  02/18/24 175 lb 6.4 oz (79.6 kg)     GEN: No acute distress  NECK: No JVD; No carotid bruits CARDIAC: {EPRHYTHM:28826}, no murmurs, rubs, gallops RESPIRATORY:  Clear to auscultation without rales, wheezing or rhonchi  ABDOMEN: Soft, non-tender, non-distended EXTREMITIES:  {EDEMA LEVEL:28147::No} edema; No deformity   ASSESSMENT AND PLAN:    CHB s/p [employer] PPM  RV lead malfunction - s/p extraction and new lead placement Normal PPM function See Pace Art report No changes today  HTN Stable on current regimen   {Click here to Review PMH, Prob List, Meds, Allergies, SHx, FHx  :1}   Disposition:   Follow up with {EPPROVIDERS:28135::EP  Team} {EPFOLLOW UP:28173}  Signed, Ozell Prentice Passey, PA-C

## 2024-10-07 ENCOUNTER — Encounter: Payer: Self-pay | Admitting: Student

## 2024-10-07 ENCOUNTER — Ambulatory Visit: Attending: Student | Admitting: Student

## 2024-10-07 ENCOUNTER — Ambulatory Visit: Payer: Self-pay | Admitting: Cardiology

## 2024-10-07 VITALS — BP 128/80 | HR 67 | Ht 67.0 in

## 2024-10-07 DIAGNOSIS — T82110A Breakdown (mechanical) of cardiac electrode, initial encounter: Secondary | ICD-10-CM | POA: Insufficient documentation

## 2024-10-07 DIAGNOSIS — I498 Other specified cardiac arrhythmias: Secondary | ICD-10-CM | POA: Insufficient documentation

## 2024-10-07 DIAGNOSIS — I10 Essential (primary) hypertension: Secondary | ICD-10-CM | POA: Diagnosis not present

## 2024-10-07 DIAGNOSIS — I442 Atrioventricular block, complete: Secondary | ICD-10-CM | POA: Diagnosis not present

## 2024-10-07 LAB — CUP PACEART INCLINIC DEVICE CHECK
Battery Remaining Longevity: 174 mo
Battery Voltage: 3.21 V
Brady Statistic AP VP Percent: 0.19 %
Brady Statistic AP VS Percent: 27.68 %
Brady Statistic AS VP Percent: 4.49 %
Brady Statistic AS VS Percent: 67.64 %
Brady Statistic RA Percent Paced: 28.23 %
Brady Statistic RV Percent Paced: 4.68 %
Date Time Interrogation Session: 20251106095057
Implantable Lead Connection Status: 753985
Implantable Lead Connection Status: 753985
Implantable Lead Implant Date: 20190904
Implantable Lead Implant Date: 20250808
Implantable Lead Location: 753859
Implantable Lead Location: 753860
Implantable Lead Model: 3830
Implantable Lead Model: 5076
Implantable Pulse Generator Implant Date: 20250808
Lead Channel Impedance Value: 361 Ohm
Lead Channel Impedance Value: 418 Ohm
Lead Channel Impedance Value: 418 Ohm
Lead Channel Impedance Value: 684 Ohm
Lead Channel Pacing Threshold Amplitude: 0.25 V
Lead Channel Pacing Threshold Amplitude: 0.75 V
Lead Channel Pacing Threshold Pulse Width: 0.4 ms
Lead Channel Pacing Threshold Pulse Width: 0.4 ms
Lead Channel Sensing Intrinsic Amplitude: 11.25 mV
Lead Channel Sensing Intrinsic Amplitude: 3.5 mV
Lead Channel Sensing Intrinsic Amplitude: 4.375 mV
Lead Channel Sensing Intrinsic Amplitude: 9.75 mV
Lead Channel Setting Pacing Amplitude: 1.5 V
Lead Channel Setting Pacing Amplitude: 2 V
Lead Channel Setting Pacing Pulse Width: 0.4 ms
Lead Channel Setting Sensing Sensitivity: 0.9 mV
Zone Setting Status: 755011

## 2024-10-07 NOTE — Patient Instructions (Signed)
 Medication Instructions:  Your physician recommends that you continue on your current medications as directed. Please refer to the Current Medication list given to you today.  *If you need a refill on your cardiac medications before your next appointment, please call your pharmacy*  Lab Work: None ordered If you have labs (blood work) drawn today and your tests are completely normal, you will receive your results only by: MyChart Message (if you have MyChart) OR A paper copy in the mail If you have any lab test that is abnormal or we need to change your treatment, we will call you to review the results.  Follow-Up: At [facility], you and your health needs are our priority.  As part of our continuing mission to provide you with exceptional heart care, our providers are all part of one team.  This team includes your primary Cardiologist (physician) and Advanced Practice Providers or APPs (Physician Assistants and Nurse Practitioners) who all work together to provide you with the care you need, when you need it.  Your next appointment:   1 year(s)  Provider:   Fonda Kitty, MD

## 2024-10-19 DIAGNOSIS — C61 Malignant neoplasm of prostate: Secondary | ICD-10-CM | POA: Diagnosis not present

## 2024-11-03 DIAGNOSIS — E785 Hyperlipidemia, unspecified: Secondary | ICD-10-CM | POA: Diagnosis not present

## 2024-11-03 DIAGNOSIS — Z131 Encounter for screening for diabetes mellitus: Secondary | ICD-10-CM | POA: Diagnosis not present

## 2024-11-11 ENCOUNTER — Encounter

## 2024-11-16 DIAGNOSIS — Z23 Encounter for immunization: Secondary | ICD-10-CM | POA: Diagnosis not present

## 2024-11-16 DIAGNOSIS — Z1339 Encounter for screening examination for other mental health and behavioral disorders: Secondary | ICD-10-CM | POA: Diagnosis not present

## 2024-11-16 DIAGNOSIS — E785 Hyperlipidemia, unspecified: Secondary | ICD-10-CM | POA: Diagnosis not present

## 2024-11-16 DIAGNOSIS — Z0000 Encounter for general adult medical examination without abnormal findings: Secondary | ICD-10-CM | POA: Diagnosis not present

## 2024-11-16 DIAGNOSIS — Z1331 Encounter for screening for depression: Secondary | ICD-10-CM | POA: Diagnosis not present

## 2024-11-19 ENCOUNTER — Ambulatory Visit

## 2024-11-19 ENCOUNTER — Encounter

## 2024-11-19 DIAGNOSIS — I442 Atrioventricular block, complete: Secondary | ICD-10-CM

## 2024-11-20 LAB — CUP PACEART REMOTE DEVICE CHECK
Battery Remaining Longevity: 169 mo
Battery Voltage: 3.2 V
Brady Statistic AP VP Percent: 5.52 %
Brady Statistic AP VS Percent: 39.44 %
Brady Statistic AS VP Percent: 8.04 %
Brady Statistic AS VS Percent: 47 %
Brady Statistic RA Percent Paced: 46.14 %
Brady Statistic RV Percent Paced: 13.56 %
Date Time Interrogation Session: 20251218205817
Implantable Lead Connection Status: 753985
Implantable Lead Connection Status: 753985
Implantable Lead Implant Date: 20190904
Implantable Lead Implant Date: 20250808
Implantable Lead Location: 753859
Implantable Lead Location: 753860
Implantable Lead Model: 3830
Implantable Lead Model: 5076
Implantable Pulse Generator Implant Date: 20250808
Lead Channel Impedance Value: 323 Ohm
Lead Channel Impedance Value: 380 Ohm
Lead Channel Impedance Value: 380 Ohm
Lead Channel Impedance Value: 570 Ohm
Lead Channel Pacing Threshold Amplitude: 0.25 V
Lead Channel Pacing Threshold Amplitude: 0.75 V
Lead Channel Pacing Threshold Pulse Width: 0.4 ms
Lead Channel Pacing Threshold Pulse Width: 0.4 ms
Lead Channel Sensing Intrinsic Amplitude: 2.625 mV
Lead Channel Sensing Intrinsic Amplitude: 2.625 mV
Lead Channel Sensing Intrinsic Amplitude: 9.125 mV
Lead Channel Sensing Intrinsic Amplitude: 9.125 mV
Lead Channel Setting Pacing Amplitude: 1.5 V
Lead Channel Setting Pacing Amplitude: 2 V
Lead Channel Setting Pacing Pulse Width: 0.4 ms
Lead Channel Setting Sensing Sensitivity: 0.9 mV
Zone Setting Status: 755011

## 2024-11-21 ENCOUNTER — Ambulatory Visit: Payer: Self-pay | Admitting: Cardiology

## 2024-11-22 NOTE — Progress Notes (Signed)
 Remote PPM Transmission

## 2025-02-09 ENCOUNTER — Encounter

## 2025-02-10 ENCOUNTER — Encounter

## 2025-02-18 ENCOUNTER — Encounter

## 2025-05-11 ENCOUNTER — Encounter

## 2025-05-12 ENCOUNTER — Encounter

## 2025-05-20 ENCOUNTER — Encounter

## 2025-08-10 ENCOUNTER — Encounter

## 2025-08-11 ENCOUNTER — Encounter

## 2025-11-09 ENCOUNTER — Encounter

## 2026-02-08 ENCOUNTER — Encounter

## 2026-05-10 ENCOUNTER — Encounter
